# Patient Record
Sex: Female | Born: 1975 | Race: White | Hispanic: No | Smoking: Never smoker
Health system: Southern US, Community
[De-identification: ages and names within clinical notes are randomized; demographics above are authoritative.]

## PROBLEM LIST (undated history)

## (undated) ENCOUNTER — Inpatient Hospital Stay (HOSPITAL_COMMUNITY): Payer: Self-pay

## (undated) ENCOUNTER — Emergency Department (HOSPITAL_COMMUNITY): Admission: EM | Payer: Managed Care, Other (non HMO) | Source: Home / Self Care

## (undated) DIAGNOSIS — M25569 Pain in unspecified knee: Secondary | ICD-10-CM

## (undated) DIAGNOSIS — F419 Anxiety disorder, unspecified: Secondary | ICD-10-CM

## (undated) DIAGNOSIS — F32A Depression, unspecified: Secondary | ICD-10-CM

## (undated) DIAGNOSIS — M222X2 Patellofemoral disorders, left knee: Secondary | ICD-10-CM

## (undated) DIAGNOSIS — G43909 Migraine, unspecified, not intractable, without status migrainosus: Secondary | ICD-10-CM

## (undated) DIAGNOSIS — G8929 Other chronic pain: Secondary | ICD-10-CM

## (undated) DIAGNOSIS — E538 Deficiency of other specified B group vitamins: Secondary | ICD-10-CM

## (undated) DIAGNOSIS — K219 Gastro-esophageal reflux disease without esophagitis: Secondary | ICD-10-CM

## (undated) DIAGNOSIS — M1990 Unspecified osteoarthritis, unspecified site: Secondary | ICD-10-CM

## (undated) DIAGNOSIS — D509 Iron deficiency anemia, unspecified: Secondary | ICD-10-CM

## (undated) DIAGNOSIS — M25512 Pain in left shoulder: Secondary | ICD-10-CM

## (undated) DIAGNOSIS — T7840XA Allergy, unspecified, initial encounter: Secondary | ICD-10-CM

## (undated) DIAGNOSIS — M7500 Adhesive capsulitis of unspecified shoulder: Secondary | ICD-10-CM

## (undated) DIAGNOSIS — E669 Obesity, unspecified: Secondary | ICD-10-CM

## (undated) DIAGNOSIS — Z9884 Bariatric surgery status: Secondary | ICD-10-CM

## (undated) DIAGNOSIS — M542 Cervicalgia: Secondary | ICD-10-CM

## (undated) DIAGNOSIS — D649 Anemia, unspecified: Secondary | ICD-10-CM

## (undated) DIAGNOSIS — F329 Major depressive disorder, single episode, unspecified: Secondary | ICD-10-CM

## (undated) HISTORY — DX: Major depressive disorder, single episode, unspecified: F32.9

## (undated) HISTORY — DX: Depression, unspecified: F32.A

## (undated) HISTORY — DX: Allergy, unspecified, initial encounter: T78.40XA

## (undated) HISTORY — DX: Deficiency of other specified B group vitamins: E53.8

## (undated) HISTORY — DX: Bariatric surgery status: Z98.84

## (undated) HISTORY — DX: Iron deficiency anemia, unspecified: D50.9

## (undated) HISTORY — DX: Gastro-esophageal reflux disease without esophagitis: K21.9

## (undated) HISTORY — DX: Unspecified osteoarthritis, unspecified site: M19.90

## (undated) SURGERY — Surgical Case
Anesthesia: *Unknown

---

## 2006-10-06 ENCOUNTER — Emergency Department (HOSPITAL_COMMUNITY): Admission: EM | Admit: 2006-10-06 | Discharge: 2006-10-06 | Payer: Self-pay | Admitting: Emergency Medicine

## 2006-10-30 ENCOUNTER — Inpatient Hospital Stay (HOSPITAL_COMMUNITY): Admission: EM | Admit: 2006-10-30 | Discharge: 2006-10-31 | Payer: Self-pay | Admitting: Emergency Medicine

## 2008-01-01 HISTORY — PX: FERTILITY SURGERY: SHX945

## 2011-04-20 HISTORY — PX: GASTRIC BYPASS: SHX52

## 2011-10-04 ENCOUNTER — Observation Stay (HOSPITAL_COMMUNITY)
Admission: EM | Admit: 2011-10-04 | Discharge: 2011-10-05 | Disposition: A | Discharge status: Home / Self Care | Payer: Medicaid Other | Attending: Obstetrics and Gynecology | Admitting: Obstetrics and Gynecology

## 2011-10-04 ENCOUNTER — Encounter: Payer: Self-pay | Admitting: Emergency Medicine

## 2011-10-04 DIAGNOSIS — O2390 Unspecified genitourinary tract infection in pregnancy, unspecified trimester: Secondary | ICD-10-CM | POA: Insufficient documentation

## 2011-10-04 DIAGNOSIS — O99891 Other specified diseases and conditions complicating pregnancy: Secondary | ICD-10-CM | POA: Insufficient documentation

## 2011-10-04 DIAGNOSIS — N390 Urinary tract infection, site not specified: Secondary | ICD-10-CM | POA: Insufficient documentation

## 2011-10-04 DIAGNOSIS — O211 Hyperemesis gravidarum with metabolic disturbance: Principal | ICD-10-CM | POA: Insufficient documentation

## 2011-10-04 DIAGNOSIS — E860 Dehydration: Secondary | ICD-10-CM

## 2011-10-04 DIAGNOSIS — O99840 Bariatric surgery status complicating pregnancy, unspecified trimester: Secondary | ICD-10-CM | POA: Insufficient documentation

## 2011-10-04 DIAGNOSIS — E669 Obesity, unspecified: Secondary | ICD-10-CM

## 2011-10-04 DIAGNOSIS — Z6827 Body mass index (BMI) 27.0-27.9, adult: Secondary | ICD-10-CM | POA: Insufficient documentation

## 2011-10-04 DIAGNOSIS — E46 Unspecified protein-calorie malnutrition: Secondary | ICD-10-CM | POA: Insufficient documentation

## 2011-10-04 DIAGNOSIS — E876 Hypokalemia: Secondary | ICD-10-CM | POA: Insufficient documentation

## 2011-10-04 HISTORY — DX: Obesity, unspecified: E66.9

## 2011-10-04 LAB — DIFFERENTIAL
Basophils Absolute: 0 10*3/uL (ref 0.0–0.1)
Basophils Relative: 0 % (ref 0–1)
Eosinophils Relative: 1 % (ref 0–5)
Monocytes Absolute: 0.5 10*3/uL (ref 0.1–1.0)
Monocytes Relative: 7 % (ref 3–12)

## 2011-10-04 LAB — ANTIBODY SCREEN: Antibody Screen: NEGATIVE

## 2011-10-04 LAB — URINALYSIS, ROUTINE W REFLEX MICROSCOPIC
Glucose, UA: NEGATIVE mg/dL
Hgb urine dipstick: NEGATIVE
Ketones, ur: >80 mg/dL — AB
Protein, ur: 30 mg/dL — AB
pH: 6 (ref 5.0–8.0)

## 2011-10-04 LAB — CBC
HCT: 35.8 % — ABNORMAL LOW (ref 36.0–46.0)
Hemoglobin: 12.6 g/dL (ref 12.0–15.0)
MCH: 29.6 pg (ref 26.0–34.0)
MCHC: 35.2 g/dL (ref 30.0–36.0)
MCV: 84 fL (ref 78.0–100.0)
RDW: 13 % (ref 11.5–15.5)

## 2011-10-04 LAB — HCG, QUANTITATIVE, PREGNANCY: hCG, Beta Chain, Quant, S: 56547 m[IU]/mL — ABNORMAL HIGH (ref <5)

## 2011-10-04 LAB — BASIC METABOLIC PANEL
BUN: 5 mg/dL — ABNORMAL LOW (ref 6–23)
Calcium: 9.4 mg/dL (ref 8.4–10.5)
Chloride: 100 mEq/L (ref 96–112)
Creatinine, Ser: 0.52 mg/dL (ref 0.50–1.10)
GFR calc Af Amer: >90 mL/min (ref >90)

## 2011-10-04 LAB — ABO/RH

## 2011-10-04 LAB — WET PREP, GENITAL
Trich, Wet Prep: NONE SEEN
Yeast Wet Prep HPF POC: NONE SEEN

## 2011-10-04 LAB — URINE MICROSCOPIC-ADD ON

## 2011-10-04 LAB — RPR: RPR: NONREACTIVE

## 2011-10-04 MED ORDER — SODIUM CHLORIDE 0.9 % IV BOLUS (SEPSIS)
1000.0000 mL | Freq: Once | INTRAVENOUS | Status: AC
  Administered 2011-10-04: 1000 mL via INTRAVENOUS

## 2011-10-04 MED ORDER — DOCUSATE SODIUM 100 MG PO CAPS
100.0000 mg | ORAL_CAPSULE | Freq: Every day | ORAL | Status: DC
  Administered 2011-10-05: 100 mg via ORAL
  Filled 2011-10-04: qty 1

## 2011-10-04 MED ORDER — ACETAMINOPHEN 325 MG PO TABS: 650.0000 mg | ORAL_TABLET | ORAL | Status: DC | PRN

## 2011-10-04 MED ORDER — POTASSIUM CHLORIDE CRYS ER 20 MEQ PO TBCR
40.0000 meq | EXTENDED_RELEASE_TABLET | Freq: Once | ORAL | Status: AC
  Administered 2011-10-04: 40 meq via ORAL

## 2011-10-04 MED ORDER — PRENATAL PLUS 27-1 MG PO TABS
1.0000 | ORAL_TABLET | Freq: Every day | ORAL | Status: DC
Start: 2011-10-05 — End: 2011-10-05
  Administered 2011-10-05: 1 via ORAL
  Filled 2011-10-04 (×4): qty 1

## 2011-10-04 MED ORDER — CALCIUM CARBONATE ANTACID 500 MG PO CHEW: 2.0000 | CHEWABLE_TABLET | ORAL | Status: DC | PRN

## 2011-10-04 MED ORDER — ZOLPIDEM TARTRATE 5 MG PO TABS: 10.0000 mg | ORAL_TABLET | Freq: Every evening | ORAL | Status: DC | PRN

## 2011-10-04 MED ORDER — ONDANSETRON HCL 4 MG/2ML IJ SOLN
4.0000 mg | Freq: Once | INTRAMUSCULAR | Status: AC
  Administered 2011-10-04: 4 mg via INTRAVENOUS
  Filled 2011-10-04: qty 2

## 2011-10-04 MED ORDER — KCL IN DEXTROSE-NACL 40-5-0.45 MEQ/L-%-% IV SOLN
INTRAVENOUS | Status: DC
  Administered 2011-10-04 – 2011-10-05 (×2): via INTRAVENOUS

## 2011-10-04 MED ORDER — PROMETHAZINE HCL 25 MG/ML IJ SOLN
12.5000 mg | INTRAMUSCULAR | Status: DC | PRN
Start: 2011-10-04 — End: 2011-10-05
  Administered 2011-10-04: 12.5 mg via INTRAVENOUS
  Filled 2011-10-04: qty 1

## 2011-10-04 MED ORDER — DEXTROSE 5 % IV SOLN
1.0000 g | Freq: Once | INTRAVENOUS | Status: AC
  Administered 2011-10-05: 1 g via INTRAVENOUS
  Filled 2011-10-04: qty 1

## 2011-10-04 NOTE — ED Notes (Signed)
Family at bedside. 

## 2011-10-04 NOTE — ED Notes (Signed)
FHT of 168.

## 2011-10-04 NOTE — ED Notes (Signed)
Pt states she is [redacted] weeks pregnant. C/o burning with urination x 1 week. Feels dehydrated. Slightly pale to face.

## 2011-10-04 NOTE — ED Notes (Signed)
Pt stating she feels good enough to go home

## 2011-10-04 NOTE — H&P (Signed)
[name] is a 35 y.o. female presenting for hyperemesis, and dehydration, mild, with hypokalemia. 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 Tonight she had uti symptoms of suprapubic pressure, dysuria, and no frequency, but urgency.  Eval in [facility] ed shows uti findings as well as dehydration with SG > 1.030 and ketones and proteinuria. History OB History    Grav Para Term Preterm Abortions TAB SAB Ect Mult Living                 History reviewed. No pertinent past medical history. Past Surgical History  Procedure Date  . Gastric bypass   . Fertility surgery    Family History: family history is not on file. Social History:  reports that she has never smoked. She does not have any smokeless tobacco history on file. She reports that she does not drink alcohol or use illicit drugs.  ROS     Blood pressure 113/71, pulse 73, temperature 98.2 F (36.8 C), temperature source Oral, resp. rate 18, height 5\' 5"  (1.651 m), weight 74.844 kg (165 lb), last menstrual period 06/25/2011, SpO2 99.00%. Exam Physical Exam Physical Examination: General appearance - alert, well appearing, and in no distress, oriented to person, place, and time and dehydrated Mouth - mucous membranes moist, pharynx normal without lesions, dental hygiene good and tongue normal Chest - not examined Abdomen - soft, nontender, nondistended, no masses or organomegaly Fetal heart tones confirmed by doppler                      No guarding or rebound, no ruq or RLQ pain.  Pelvic - examination not indicated Extremities - peripheral pulses normal, no pedal edema, no clubbing or cyanosis, no pedal edema noted Skin - normal coloration and turgor, no rashes, no suspicious skin lesions noted Prenatal labs: ABO, Rh:   Antibody:   Rubella:   RPR:    HBsAg:    HIV:    GBS:     Assessment/Plan: Pregnancy 12 weeks, Hyperemesis  Gravidarum with dehydration and Hypokalemia,  Plan:  Overnight rehydration, and discharge for outpatient care Request records from [facility], [place], near [place] regarding baseline pregnancy labs.   Lakaya Tolen V 10/04/2011, 9:44 PM

## 2011-10-04 NOTE — ED Notes (Signed)
Patient is resting comfortably. 

## 2011-10-04 NOTE — ED Provider Notes (Signed)
History     CSN: [id number] Arrival date & time: 10/04/2011  4:04 PM  Chief Complaint  Patient presents with  . Dysuria  . Dehydration    (Consider location/radiation/quality/duration/timing/severity/associated sxs/prior treatment) HPI Comments: Patient who is approximately [redacted] weeks pregnant c/o burning with urination for one week.  Also c/o generalized fatique, and weakness with excessive vomiting.  Also reports that she had gastric bypass in April 2012.  Patient has recently moved here from [place] and has appt with Dr. Emelda Fear on October 17.  She denies fever, diarrhea or upper abdominal pain.    Patient is a 35 y.o. female presenting with dysuria. The history is provided by the patient.  Dysuria  This is a new problem. The current episode started more than 2 days ago. The problem occurs intermittently. The problem has been gradually worsening. The quality of the pain is described as burning and aching. The pain is moderate. There has been no fever. She is sexually active. Associated symptoms include nausea, vomiting, discharge and flank pain. Pertinent negatives include no chills, no sweats, no frequency, no hematuria and no hesitancy. Associated symptoms comments: [redacted] weeks pregnant. She has tried nothing for the symptoms. Her past medical history is significant for kidney stones. Her past medical history does not include urological procedure or catheterization.    History reviewed. No pertinent past medical history.  Past Surgical History  Procedure Date  . Gastric bypass   . Fertility surgery     History reviewed. No pertinent family history.  History  Substance Use Topics  . Smoking status: Never Smoker   . Smokeless tobacco: Not on file  . Alcohol Use: No    OB History    Grav Para Term Preterm Abortions TAB SAB Ect Mult Living                  Review of Systems  Constitutional: Positive for fatigue. Negative for fever, chills, activity change and appetite change.    HENT: Negative for sore throat, facial swelling, trouble swallowing, neck pain and neck stiffness.   Respiratory: Negative for cough, shortness of breath and wheezing.   Cardiovascular: Negative for chest pain and palpitations.  Gastrointestinal: Positive for nausea, vomiting and abdominal pain. Negative for diarrhea, blood in stool and abdominal distention.  Genitourinary: Positive for dysuria, flank pain and pelvic pain. Negative for hesitancy, frequency, hematuria, decreased urine volume and vaginal bleeding.  Musculoskeletal: Negative for myalgias, back pain and arthralgias.  Skin: Negative for rash.  Neurological: Positive for weakness. Negative for dizziness, speech difficulty and numbness.  Hematological: Does not bruise/bleed easily.  All other systems reviewed and are negative.    Allergies  Morphine and related and Penicillins  Home Medications     BP 105/70  Pulse 68  Temp(Src) 98.2 F (36.8 C) (Oral)  Resp 16  Ht 5\' 5"  (1.651 m)  Wt 165 lb (74.844 kg)  BMI 27.46 kg/m2  SpO2 100%  LMP 06/25/2011  Physical Exam  Nursing note and vitals reviewed. Constitutional: She is oriented to person, place, and time. She appears well-developed and well-nourished. No distress.  HENT:  Head: Normocephalic and atraumatic.       Mucous membranes are slightly dry.    Eyes: EOM are normal. Pupils are equal, round, and reactive to light.  Neck: Normal range of motion. Neck supple. No JVD present.  Cardiovascular: Normal rate, regular rhythm and normal heart sounds.   No murmur heard. Pulmonary/Chest: Effort normal and breath sounds normal. No respiratory  distress. She exhibits no tenderness.  Abdominal: Soft. She exhibits no distension and no mass. There is no splenomegaly or hepatomegaly. There is tenderness in the suprapubic area. There is no rigidity, no rebound, no guarding and no CVA tenderness.  Genitourinary: Uterus is enlarged. Cervix exhibits no motion tenderness and no  friability. Right adnexum displays no mass and no tenderness. Left adnexum displays no mass and no tenderness. No erythema, tenderness or bleeding around the vagina. No foreign body around the vagina. Vaginal discharge found.       Uterus is gravid.  Cervix is closed.  No vaginal bleeding.    Musculoskeletal: Normal range of motion. She exhibits no edema and no tenderness.  Lymphadenopathy:    She has no cervical adenopathy.  Neurological: She is alert and oriented to person, place, and time. She has normal strength. No cranial nerve deficit or sensory deficit. She exhibits normal muscle tone. Coordination normal.  Skin: Skin is warm and dry.    ED Course  Procedures (including critical care time)  Results for orders placed during the hospital encounter of 10/04/11  URINALYSIS, ROUTINE W REFLEX MICROSCOPIC      Component Value Range   Color, Urine AMBER (*) YELLOW    Appearance CLEAR  CLEAR    Specific Gravity, Urine >1.030 (*) 1.005 - 1.030    pH 6.0  5.0 - 8.0    Glucose, UA NEGATIVE  NEGATIVE (mg/dL)   Hgb urine dipstick NEGATIVE  NEGATIVE    Bilirubin Urine MODERATE (*) NEGATIVE    Ketones, ur >80 (*) NEGATIVE (mg/dL)   Protein, ur 30 (*) NEGATIVE (mg/dL)   Urobilinogen, UA 1.0  0.0 - 1.0 (mg/dL)   Nitrite NEGATIVE  NEGATIVE    Leukocytes, UA NEGATIVE  NEGATIVE   CBC      Component Value Range   WBC 7.0  4.0 - 10.5 (K/uL)   RBC 4.26  3.87 - 5.11 (MIL/uL)   Hemoglobin 12.6  12.0 - 15.0 (g/dL)   HCT 19.1 (*) 47.8 - 46.0 (%)   MCV 84.0  78.0 - 100.0 (fL)   MCH 29.6  26.0 - 34.0 (pg)   MCHC 35.2  30.0 - 36.0 (g/dL)   RDW 29.5  62.1 - 30.8 (%)   Platelets 197  150 - 400 (K/uL)  DIFFERENTIAL      Component Value Range   Neutrophils Relative 70  43 - 77 (%)   Neutro Abs 4.9  1.7 - 7.7 (K/uL)   Lymphocytes Relative 22  12 - 46 (%)   Lymphs Abs 1.5  0.7 - 4.0 (K/uL)   Monocytes Relative 7  3 - 12 (%)   Monocytes Absolute 0.5  0.1 - 1.0 (K/uL)   Eosinophils Relative 1  0 - 5  (%)   Eosinophils Absolute 0.1  0.0 - 0.7 (K/uL)   Basophils Relative 0  0 - 1 (%)   Basophils Absolute 0.0  0.0 - 0.1 (K/uL)  BASIC METABOLIC PANEL      Component Value Range   Sodium 137  135 - 145 (mEq/L)   Potassium 3.0 (*) 3.5 - 5.1 (mEq/L)   Chloride 100  96 - 112 (mEq/L)   CO2 18 (*) 19 - 32 (mEq/L)   Glucose, Bld 77  70 - 99 (mg/dL)   BUN 5 (*) 6 - 23 (mg/dL)   Creatinine, Ser 6.57  0.50 - 1.10 (mg/dL)   Calcium 9.4  8.4 - 84.6 (mg/dL)   GFR calc non Af Amer >90  >  90 (mL/min)   GFR calc Af Amer >90  >90 (mL/min)  HCG, QUANTITATIVE, PREGNANCY      Component Value Range   hCG, Beta Chain, Quant, S [address] (*) <5 (mIU/mL)  URINE MICROSCOPIC-ADD ON      Component Value Range   Squamous Epithelial / LPF MANY (*) RARE    WBC, UA 0-2  <3 (WBC/hpf)   Bacteria, UA MANY (*) RARE    Casts HYALINE CASTS (*) NEGATIVE   WET PREP, GENITAL      Component Value Range   Yeast, Wet Prep NONE SEEN  NONE SEEN    Trich, Wet Prep NONE SEEN  NONE SEEN    Clue Cells, Wet Prep RARE (*) NONE SEEN    WBC, Wet Prep HPF POC MODERATE (*) NONE SEEN        MDM     8:26 PM patient is feeling better,  Vitals stable. No active vomiting during ED stay.   She has proteinuria w/o extremity edema or HTN.  Urine culture is pending. I will consult Dr. Emelda Fear.  I have also discussed pt hx, labs and care plan with the EDP   2116  Dr. Emelda Fear to admit patient for further IVF's and monitoring.    Oneka Parada L. [place], [place] 10/04/11 2232

## 2011-10-05 LAB — CBC
HCT: 29.3 % — ABNORMAL LOW (ref 36.0–46.0)
MCH: 29.7 pg (ref 26.0–34.0)
MCV: 84.4 fL (ref 78.0–100.0)
RBC: 3.47 MIL/uL — ABNORMAL LOW (ref 3.87–5.11)
RDW: 13.1 % (ref 11.5–15.5)
WBC: 5.3 10*3/uL (ref 4.0–10.5)

## 2011-10-05 LAB — BASIC METABOLIC PANEL
CO2: 19 mEq/L (ref 19–32)
Chloride: 109 mEq/L (ref 96–112)
Creatinine, Ser: <0.47 mg/dL — ABNORMAL LOW (ref 0.50–1.10)
Glucose, Bld: 75 mg/dL (ref 70–99)

## 2011-10-05 MED ORDER — SODIUM CHLORIDE 0.9 % IV SOLN
Freq: Once | INTRAVENOUS | Status: AC
  Administered 2011-10-05: 10:00:00 via INTRAVENOUS
  Filled 2011-10-05: qty 1000

## 2011-10-05 MED ORDER — SODIUM CHLORIDE 0.9 % IJ SOLN
INTRAMUSCULAR | Status: AC
  Filled 2011-10-05: qty 3

## 2011-10-05 MED ORDER — ONDANSETRON 4 MG PO TBDP
8.0000 mg | ORAL_TABLET | Freq: Three times a day (TID) | ORAL | Status: DC | PRN
  Administered 2011-10-05: 8 mg via ORAL
  Filled 2011-10-05: qty 2

## 2011-10-05 MED ORDER — ONDANSETRON 8 MG PO TBDP: 8.0000 mg | ORAL_TABLET | Freq: Three times a day (TID) | ORAL | Status: AC | PRN

## 2011-10-05 MED ORDER — PROMETHAZINE HCL 50 MG RE SUPP: 25.0000 mg | Freq: Four times a day (QID) | RECTAL | Status: AC | PRN

## 2011-10-05 MED ORDER — DEXTROSE 5 % IV SOLN
INTRAVENOUS | Status: AC
  Filled 2011-10-05: qty 1

## 2011-10-05 MED ORDER — POTASSIUM CHLORIDE 10 MEQ PO CPCR: 10.0000 meq | ORAL_CAPSULE | Freq: Two times a day (BID) | ORAL | Status: DC

## 2011-10-05 MED ORDER — DEXTROSE-NACL 5-0.45 % IV SOLN: INTRAVENOUS | Status: DC

## 2011-10-05 MED ORDER — PRENATAL PLUS 27-1 MG PO TABS: 1.0000 | ORAL_TABLET | Freq: Every day | ORAL | Status: DC

## 2011-10-05 MED ORDER — PRO-STAT SUGAR FREE PO LIQD: 30.0000 mL | Freq: Three times a day (TID) | ORAL | Status: DC

## 2011-10-05 NOTE — Progress Notes (Signed)
Fetal Heart tones assessed at 175 bpm.

## 2011-10-05 NOTE — Progress Notes (Signed)
Patient received discharge instructions along with follow up appointments and prescriptions. Patient verbalized understanding of all instructions. Patient was escorted by staff via wheelchair to vehicle. Patient discharged to home in stable condition. 

## 2011-10-05 NOTE — Progress Notes (Signed)
UR Chart Review Completed  

## 2011-10-05 NOTE — Discharge Summary (Signed)
  See dictated discharge summary this date. [email or web address]

## 2011-10-05 NOTE — Progress Notes (Signed)
INITIAL ADULT NUTRITION ASSESSMENT Date: 10/05/2011   Time: 11:19 AM Reason for Assessment: Hyperemesis-gravidum, 35# wt loss in 12 weeks of pregnancy, s/p gastric bypass surgery.  ASSESSMENT: Female 35 y.o.  Dx: <principal problem not specified>   Past Medical History  Diagnosis Date  . Obesity     gastric Bypass, Roux-en Y    Scheduled Meds:   . cefTRIAXone (ROCEPHIN) IV  1 g Intravenous Once  . docusate sodium  100 mg Oral Daily  . ondansetron  4 mg Intravenous Once  . potassium chloride SA  40 mEq Oral Once  . prenatal vitamin w/FE, FA  1 tablet Oral Daily  . sodium chloride 0.9 % 1,000 mL with multivitamins adult (MVI -12) 10 mL infusion   Intravenous Once  . sodium chloride  1,000 mL Intravenous Once  . sodium chloride  1,000 mL Intravenous Once   Continuous Infusions:   . dextrose 5 % and 0.45% NaCl    . DISCONTD: dextrose 5 % and 0.45 % NaCl with KCl 40 mEq/L 200 mL/hr at 10/05/11 0554   PRN Meds:.acetaminophen, calcium carbonate, ondansetron, promethazine, zolpidem  Ht: 5\' 5"  (165.1 cm)  Wt: 169 lb 5 oz (76.8 kg)  Ideal Wt: 57 kg (125#)  61.5 kg % Ideal Wt: 135%  Usual Wt: pre-op wt 271# % Usual Wt: 62%  Body mass index is 28.18 kg/(m^2).  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 Planned wt loss of 67# prior to July. She became pregnant in late July and has cont to lose severe amount of wt- unplanned (35#,18%) since conceiving. Currnetly being tx for dehydration, Hyperemesis-gravidum. She is tol her breakfast today and reports to be ready for lunch. We discussed adequacy of nutrition to support current pregnancy and reviewed  Advanced Gastric Bypass diet. She has severe malnutrition in the context of chronic illness given her wt loss, altered GI function with inadequate oral intake.   BMET    Component Value Date/Time   NA 138 10/05/2011 0447   K 3.7 10/05/2011 0447   CL 109 10/05/2011 0447   CO2 19 10/05/2011 0447   GLUCOSE 75 10/05/2011 0447   BUN 3* 10/05/2011 0447   CREATININE <0.47* 10/05/2011 0447   CALCIUM 8.1* 10/05/2011 0447   GFRNONAA NOT CALCULATED 10/05/2011 0447   GFRAA NOT CALCULATED 10/05/2011 0447   CBC    Component Value Date/Time   WBC 5.3 10/05/2011 0447   RBC 3.47* 10/05/2011 0447   HGB 10.4* 10/05/2011 0447   HCT 29.3* 10/05/2011 0447   PLT 163 10/05/2011 0447   MCV 84.4 10/05/2011 0447   MCH 29.7 10/05/2011 0447   MCHC 35.2 10/05/2011 0447   RDW 13.1 10/05/2011 0447   LYMPHSABS 1.5 10/04/2011 1724   MONOABS 0.5 10/04/2011 1724   EOSABS 0.1 10/04/2011 1724   BASOSABS 0.0 10/04/2011 1724   CBG (last 3)  No results found for this basename: GLUCAP:3 in the last 72 hours    Intake/Output Summary (Last 24 hours) at 10/05/11 1128 Last data filed at 10/05/11 0523  Gross per 24 hour  Intake 1226.67 ml  Output      0 ml  Net 1226.67 ml    Diet Order:  Advanced Bariatric-  Supplements/Tube Feeding:none at this time  IVF:    dextrose 5 % and 0.45% NaCl   DISCONTD: dextrose 5 % and 0.45 % NaCl with KCl 40 mEq/L Last Rate: 200 mL/hr at 10/05/11 0554    Estimated Nutritional Needs:  Kcal:1440-1575 Protein:70-80 grams Fluid:1.4-1.6 L/d  NUTRITION DIAGNOSIS: -Inadequate oral intake (NI-2.1).  Status: Ongoing  RELATED TO:  -altered GI function  AS EVIDENCE BY:  -N/V, dehydration, s/p Gastric bypass ~2mo ago  MONITORING/EVALUATION(Goals -Alleviate symptoms and prevent additional unplanned wt loss. -Stable electrolyte and fluid balance -Pt will tol Regular diet with appropriate modifications r/t gastric bypass -Adequate rate of weight gain                2 - 5 lb. total fist trimester     0.5 - 1 lb each week second and third trimester -Monitor po's, labs, wt changes  EDUCATION NEEDS: -Education needs addressed r/t gastric bypass and pregnancy  INTERVENTION: -Provide edu and obtain food preferences to optimize nutr status -Add Sugar-free ProStat 30 ml TID (216 kcal, 45  gr protein) per day -If conservative therapy fails rec consider TF to meet nutr needs. -If TF becomes and option rec continuous Osm 1.5 via NG. Initiate at 20 ml/hr adv to 40 ml/hr (goal rate provides: 1440 kcal, 60 gr protein, 731 ml water). -Rec [facility] after d/c due to compromised nutr status  Dietitian [phone]  DOCUMENTATION CODES Per approved criteria  -Severe malnutrition in the context of chronic illness    [name] 10/05/2011, 11:19 AM

## 2011-10-05 NOTE — Progress Notes (Signed)
Subjective: Patient reports nausea.  No vomiting  She has voided only once this morning, but is hungry, desires regular diet   Objective: I have reviewed patient's vital signs and labs.  GI: soft, non-tender; bowel sounds normal; no masses,  no organomegaly   Assessment/Plan: Pregnancy, 12 weeks, hyperemesis, s/p gastric bypass..  Add multivits, add dietary consult LOS: 1 day    Chaye Misch V 10/05/2011, 9:43 AM

## 2011-10-05 NOTE — ED Provider Notes (Signed)
Medical screening examination/treatment/procedure(s) were conducted as a shared visit with non-physician practitioner(s) and myself.  I personally evaluated the patient during the encounter  [redacted] weeks pregnant, confirmed IUP in [place].  Minimal suprapubic tenderness. Feeling generally weak, AFVSS  Glynn Octave, MD 10/05/11 1153

## 2011-10-06 LAB — GC/CHLAMYDIA PROBE AMP, GENITAL: Chlamydia, DNA Probe: NEGATIVE

## 2011-10-06 NOTE — Discharge Summary (Signed)
NAME:  [name], BENJAMIN NO.:  [id number]  MEDICAL RECORD NO.:  [id number]  LOCATION:  A301                          FACILITY:  [facility]  PHYSICIAN:  [name], M.D. DATE OF BIRTH:  Aug 31, 1976  DATE OF ADMISSION:  10/04/2011 DATE OF DISCHARGE:  10/05/2012LH                              DISCHARGE SUMMARY   ADMITTING DIAGNOSES:  Intrauterine pregnancy 12 weeks' gestation, mild dehydration, malnutrition status post gastric bypass, and extreme weight loss, electrolyte and fluid balance disorder.  Urinary tract infection.  DISCHARGE DIAGNOSES:  Intrauterine pregnancy 12 weeks, dehydration resolved, hypokalemia, corrected.  PROCEDURE:  IV fluid hydration duration x18 hours, nutritional consult.  DISCHARGE MEDICATIONS: 1. Zofran ODT 8 mg sublingual q.8 h p.r.n. nausea. 2. Phenergan 25 mg rectal suppositories q.6 h p.r.n. severe nausea. 3. Colace 100 mg p.o. b.i.d. 4. Potassium capsules 20 mEq p.o. daily. 5. Nutritional supplements to include referral to [facility] after discharge to support nutritional     status. Sugar Free Pro-Stat 30 mL t.i.d. per day.  Consideration of     tube feedings with continuous OSM 1.5 via NG tube if necessary for     goal of 1400-1500 calories per day. 6. Macrobid 100 mg p.o. b.i.d.  HOSPITAL SUMMARY:  This 35 year old female gravida 3, para 2-0-0-2, is admitted for hyperemesis and dehydration with mild hypokalemia.  She is new to this area, is status post gastric bypass earlier this year and a presurgical weight of 271 pounds with a 100-pound weight loss since surgery, 35-pound weight loss since conception.  She presented to the ER due to urinary tract infection symptoms.  The patient had a gastric bypass, Roux-en-Y, procedure performed early 2012 in [place], [place].  PAST MEDICAL HISTORY:  Positive for morbid obesity only.  SURGICAL HISTORY:  Gastric bypass surgery, fertility  surgery.  Physical exam reveals a generally slim Caucasian female whose admitting status was notable for mild dehydration and ketosis with specific gravity 1.030, greater than 80 mg per deciliter of ketones, 1+ protein, negative nitrates and leukocytes with many hyaline casts.  Wet prep was negative.  BUN is 5, creatinine 0.52 indicating a chronic dehydration, compensated.  Sodium 137, potassium 3.0, and creatinine 0.52. Quantitative beta hCG of 56,547.  HOSPITAL COURSE:  The patient was admitted, had admitting temperature 98.2, blood pressure 113/71, pulse 73, height 5 feet 5 inches, weight 165 pounds which is 74.844 kg.  She showed adequate hydration, was oriented, mucous membranes were moist.  Fetal heart tones were documented in the abdomen which was otherwise nondistended and nontender.  Pelvic exam deferred.  The patient was admitted, received vigorous IV fluid hydration with 40 mEq KCl per liter, and overnight had gradual improvement in hydration and urinary output.  She had multivitamins administered.  Urine C and S was ordered and is pending.  Nutritional consult was performed by Francene Boyers, registered dietitian, making note of the patient's inadequate nutritional status.  Estimated nutritional needs were 1440- 1575 calories per day with 70-80 g protein and 1.4 to 1.6 liters of fluid per day desired.  Nutritional dietitian phone contact number is [phone].  See EPIC for details of nutritional consult.  The  patient tolerated "half" of her regular breakfast and lunch with no vomiting, was discharged home.  It appears that she is not actually throwing up and much she is avoiding p.o. intake due to loss of appetite.  She has continued to, as she was trying to lose weight.  The patient is aware that nutritional supplementation will be necessary and follow up will be made with Nutrition and [facility] Management at [facility].  DISCHARGE FOLLOWUP:  [facility] in 1  week where we will initiate prenatal evaluation and care and achieve taking old records from [facility], [place], [place].     [name], M.D.     JVF/MEDQ  D:  10/05/2011  T:  10/06/2011  Job:  [id number]  cc:   [facility]

## 2011-10-28 ENCOUNTER — Encounter (HOSPITAL_COMMUNITY): Payer: Self-pay

## 2011-10-28 ENCOUNTER — Emergency Department (HOSPITAL_COMMUNITY)
Admission: EM | Admit: 2011-10-28 | Discharge: 2011-10-28 | Disposition: A | Discharge status: Home / Self Care | Payer: Medicaid Other | Attending: Emergency Medicine | Admitting: Emergency Medicine

## 2011-10-28 DIAGNOSIS — N760 Acute vaginitis: Secondary | ICD-10-CM | POA: Insufficient documentation

## 2011-10-28 DIAGNOSIS — J029 Acute pharyngitis, unspecified: Secondary | ICD-10-CM | POA: Insufficient documentation

## 2011-10-28 DIAGNOSIS — O2390 Unspecified genitourinary tract infection in pregnancy, unspecified trimester: Secondary | ICD-10-CM | POA: Insufficient documentation

## 2011-10-28 LAB — URINALYSIS, ROUTINE W REFLEX MICROSCOPIC
Leukocytes, UA: NEGATIVE
Protein, ur: NEGATIVE mg/dL
Urobilinogen, UA: >8 mg/dL — ABNORMAL HIGH (ref 0.0–1.0)

## 2011-10-28 LAB — WET PREP, GENITAL
Clue Cells Wet Prep HPF POC: NONE SEEN
Trich, Wet Prep: NONE SEEN

## 2011-10-28 MED ORDER — LIDOCAINE VISCOUS 2 % MT SOLN
20.0000 mL | Freq: Once | OROMUCOSAL | Status: AC
  Administered 2011-10-28: 20 mL via OROMUCOSAL
  Filled 2011-10-28 (×2): qty 15

## 2011-10-28 MED ORDER — LIDOCAINE VISCOUS 2 % MT SOLN: 15.0000 mL | Freq: Four times a day (QID) | OROMUCOSAL | Status: AC

## 2011-10-28 NOTE — ED Notes (Signed)
Pt presents with sore throat, fever, muscle aches, and vaginal discharge. Pt states the cold like symptoms started yesterday. The vaginal discharge started 1 week ago. Pt is 15.[redacted] weeks pregnant. NAD at this time.

## 2011-10-29 NOTE — ED Provider Notes (Signed)
History     CSN: [id number] Arrival date & time: 10/28/2011  8:10 PM   First MD Initiated Contact with Patient 10/28/11 2040      Chief Complaint  Patient presents with  . Sore Throat  . Vaginal Discharge    (Consider location/radiation/quality/duration/timing/severity/associated sxs/prior treatment) Patient is a 35 y.o. female presenting with pharyngitis and vaginal discharge. The history is provided by the patient.  Sore Throat This is a new problem. The current episode started yesterday. The problem occurs constantly. The problem has been unchanged. Associated symptoms include a fever, myalgias and a sore throat. Pertinent negatives include no abdominal pain, arthralgias, chest pain, chills, congestion, coughing, headaches, joint swelling, nausea, neck pain, numbness, rash or weakness. Associated symptoms comments: She also complains of itching, irritation and burning around her vulva and clitoris.  She is [redacted] weeks pregnant followed by Dr. Emelda Fear.  She does have a history of bacterial vaginosis which presented with similar symptoms,  rather than vaginal discharge (describes clear discharge tonight).  Denies abdominal and pelvic pain with no vaginal bleeding.. She has tried acetaminophen for the symptoms. The treatment provided no relief.  Vaginal Discharge Associated symptoms include a fever, myalgias and a sore throat. Pertinent negatives include no abdominal pain, arthralgias, chest pain, chills, congestion, coughing, headaches, joint swelling, nausea, neck pain, numbness, rash or weakness.    Past Medical History  Diagnosis Date  . Obesity     gastric Bypass, Roux-en Y    Past Surgical History  Procedure Date  . Gastric bypass 04/20/2011    [place], [place] 271lb preop  . Fertility surgery 2009    No abnormalities in female    Family History  Problem Relation Age of Onset  . Diabetes Father   . Hyperlipidemia Father   . Hypertension Father   . Cancer Father     skin  cancer  . Depression Mother     lifelong    History  Substance Use Topics  . Smoking status: Never Smoker   . Smokeless tobacco: Never Used  . Alcohol Use: No    OB History    Grav Para Term Preterm Abortions TAB SAB Ect Mult Living   3 2 2  0 0 0 0 0 0 2      Review of Systems  Constitutional: Positive for fever. Negative for chills.  HENT: Positive for sore throat. Negative for congestion, rhinorrhea, trouble swallowing, neck pain and sinus pressure.   Eyes: Negative.   Respiratory: Negative for cough, chest tightness and shortness of breath.   Cardiovascular: Negative for chest pain.  Gastrointestinal: Negative for nausea and abdominal pain.  Genitourinary: Positive for dysuria. Negative for frequency, vaginal bleeding, vaginal discharge and vaginal pain.  Musculoskeletal: Positive for myalgias. Negative for joint swelling and arthralgias.  Skin: Negative.  Negative for rash and wound.  Neurological: Negative for dizziness, weakness, light-headedness, numbness and headaches.  Hematological: Negative.   Psychiatric/Behavioral: Negative.     Allergies  Morphine and related and Penicillins  Home Medications      BP 113/72  Pulse 77  Temp(Src) 98.3 F (36.8 C) (Oral)  Resp 20  Ht 5\' 5"  (1.651 m)  Wt 166 lb (75.297 kg)  BMI 27.62 kg/m2  SpO2 100%  LMP 06/25/2011  Physical Exam  Nursing note and vitals reviewed. Constitutional: She is oriented to person, place, and time. She appears well-developed and well-nourished.  HENT:  Head: Normocephalic and atraumatic.  Eyes: Conjunctivae are normal.  Neck: Normal range of motion.  Cardiovascular: Normal rate, regular rhythm, normal heart sounds and intact distal pulses.   Pulmonary/Chest: Effort normal and breath sounds normal. She has no wheezes.  Abdominal: Soft. Bowel sounds are normal. There is no tenderness.  Genitourinary: Uterus normal. There is no tenderness or lesion on the right labia. There is no  tenderness or lesion on the left labia. Cervix exhibits discharge. Cervix exhibits no motion tenderness and no friability. Right adnexum displays no mass and no tenderness. Left adnexum displays no mass and no tenderness. Vaginal discharge found.       Labia and mons pubis appear dry,  Erythematous and irritated,  No rash noted.   Clear vaginal discharge.  Os closed.  Musculoskeletal: Normal range of motion.  Neurological: She is alert and oriented to person, place, and time.  Skin: Skin is warm and dry.  Psychiatric: She has a normal mood and affect.    ED Course  Procedures (including critical care time)  Labs Reviewed  WET PREP, GENITAL - Abnormal; Notable for the following:    WBC, Wet Prep HPF POC FEW (*)    All other components within normal limits  URINALYSIS, ROUTINE W REFLEX MICROSCOPIC - Abnormal; Notable for the following:    Bilirubin Urine SMALL (*)    Ketones, ur >80 (*)    Urobilinogen, UA >8.0 (*)    All other components within normal limits  RAPID STREP SCREEN   No results found.   1. Pharyngitis   2. Vaginitis in pregnancy       MDM  Patients labs and/or radiological studies were reviewed during the medical decision making and disposition process.   Strep negative.  Given lidocaine (viscous) gargle and spit with complete relief of sore throat.  Suggested desitin or A & D ointment for external perineal use for irritation.  Patient to followup with Dr Emelda Fear this week.        Candis Musa, PA 10/29/11 0130

## 2011-11-04 NOTE — ED Provider Notes (Signed)
Medical screening examination/treatment/procedure(s) were performed by non-physician practitioner and as supervising physician I was immediately available for consultation/collaboration.   Gwyneth Sprout, MD 11/04/11 1558

## 2011-11-13 ENCOUNTER — Encounter (HOSPITAL_COMMUNITY): Payer: Self-pay

## 2011-11-13 ENCOUNTER — Emergency Department (HOSPITAL_COMMUNITY)
Admission: EM | Admit: 2011-11-13 | Discharge: 2011-11-13 | Disposition: A | Discharge status: Home / Self Care | Payer: Medicaid Other | Attending: Emergency Medicine | Admitting: Emergency Medicine

## 2011-11-13 DIAGNOSIS — O200 Threatened abortion: Secondary | ICD-10-CM | POA: Insufficient documentation

## 2011-11-13 LAB — WET PREP, GENITAL
Clue Cells Wet Prep HPF POC: NONE SEEN
Yeast Wet Prep HPF POC: NONE SEEN

## 2011-11-13 NOTE — ED Notes (Signed)
[facility] notified of pt being on monitor, advised that they would monitor the strip

## 2011-11-13 NOTE — ED Notes (Signed)
Given message that pt. Is to be discharged to home undelivered.

## 2011-11-13 NOTE — ED Notes (Signed)
Dr. Emelda Fear at bedside performing ultrasound,

## 2011-11-13 NOTE — ED Notes (Signed)
C/o "gush of fluid" that occurred 2 hours ago with continuing to leak "fluid" pt waited at home to see what would happen per pt. Pt states contractions started. Pt unsure of how far apart contractions are. Pt reports being 18 weeks. Denies any pain at this time. Pt DID NOT call OB about pain or fluid

## 2011-11-13 NOTE — ED Notes (Signed)
Spoke with Raney at [employer] and updated on pt's condition, Dr. Emelda Fear is coming to see pt in er,

## 2011-11-13 NOTE — ED Provider Notes (Signed)
History    Scribed for Darlene Lennert, MD, the patient was seen in room APA01/APA01. This chart was scribed by Katha Cabal.   CSN: [id number] Arrival date & time: 11/13/2011  9:02 PM   First MD Initiated Contact with Patient 11/13/11 2128      Chief Complaint  Patient presents with  . Contractions  . Rupture of Membranes    (Consider location/radiation/quality/duration/timing/severity/associated sxs/prior treatment) Patient is a 35 y.o. female presenting with female genitourinary complaint. The history is provided by the patient. No language interpreter was used.  Female GU Problem Primary symptoms include discharge.  Primary symptoms include no genital odor and no vaginal bleeding. There has been no fever. This is a new problem. The current episode started 1 to 2 hours ago. The problem has not changed since onset.She is pregnant. Her LMP was months ago. The discharge was clear. Associated symptoms include abdominal pain. Pertinent negatives include no diarrhea and no frequency. She has tried nothing for the symptoms.  Patient is around [redacted] weeks pregnant.  Patient complains of mild to moderate contractions described as cramping. Symptoms are associated with leaking of clear fluid.  Patient reports "gush of fluid" about 2 hours ago.  Patient reports multiple episodes of leaking fluid yesterday.  Patient has not contacted OB-GYN regarding fluid or pain.  Symptoms are not associated with vaginal bleeding or odorous fluid.  Patient states she did not have Deberah Pelton during her other pregnancies. Patient denies current pain.    PCP [name], MD, MD     Past Medical History  Diagnosis Date  . Obesity     gastric Bypass, Roux-en Y    Past Surgical History  Procedure Date  . Gastric bypass 04/20/2011    [place], wt 271lb preop  . Fertility surgery 2009    No abnormalities in female    Family History  Problem Relation Age of Onset  . Diabetes Father   .  Hyperlipidemia Father   . Hypertension Father   . Cancer Father     skin cancer  . Depression Mother     lifelong    History  Substance Use Topics  . Smoking status: Never Smoker   . Smokeless tobacco: Never Used  . Alcohol Use: No    OB History    Grav Para Term Preterm Abortions TAB SAB Ect Mult Living   3 2 2  0 0 0 0 0 0 2      Review of Systems  Constitutional: Negative for fatigue.  HENT: Negative for congestion, sinus pressure and ear discharge.   Eyes: Negative for discharge.  Respiratory: Negative for cough.   Cardiovascular: Negative for chest pain.  Gastrointestinal: Positive for abdominal pain. Negative for diarrhea.  Genitourinary: Positive for vaginal discharge. Negative for frequency, hematuria and vaginal bleeding.  Musculoskeletal: Negative for back pain.  Skin: Negative for rash.  Neurological: Negative for seizures and headaches.  Hematological: Negative.   Psychiatric/Behavioral: Negative for hallucinations.    Allergies  Morphine and related and Penicillins  Home Medications   Current Outpatient Rx  Name Route Sig Dispense Refill  . VITAMIN D 1000 UNITS PO TABS Oral Take 1,000 Units by mouth daily.      Marland Kitchen POTASSIUM CHLORIDE 10 MEQ PO CPCR Oral Take 1 capsule (10 mEq total) by mouth 2 (two) times daily. 60 capsule 3  . PRENATAL PLUS 27-1 MG PO TABS Oral Take 1 tablet by mouth daily. 30 each 4  . THIAMINE HCL 100 MG  PO TABS Oral Take 100 mg by mouth every 7 (seven) days. On Mondays of each week     . VITAMIN B-12 IJ Injection Inject 1 each as directed every 30 (thirty) days. Vitamin B-12 1072mcg/ml Injection       BP 100/59  Pulse 85  Temp 98.2 F (36.8 C)  Resp 18  Ht 5\' 5"  (1.651 m)  Wt 157 lb (71.215 kg)  BMI 26.13 kg/m2  SpO2 98%  LMP 06/25/2011  Physical Exam  Constitutional: She is oriented to person, place, and time. She appears well-developed and well-nourished.  HENT:  Head: Normocephalic and atraumatic.  Eyes: Conjunctivae  and EOM are normal. No scleral icterus.  Neck: Neck supple. No thyromegaly present.  Cardiovascular: Normal rate and regular rhythm.  Exam reveals no gallop and no friction rub.   No murmur heard. Pulmonary/Chest: Effort normal. No stridor. No respiratory distress. She has no wheezes. She has no rales. She exhibits no tenderness.  Abdominal: She exhibits no distension. There is tenderness (mildy ) in the periumbilical area. There is no rebound.       Can feel fetus in upper umbilicus   Genitourinary: Vagina normal.       Fetal heart tones in 150s, closed os, clear fluid in vault   Musculoskeletal: Normal range of motion. She exhibits no edema.  Lymphadenopathy:    She has no cervical adenopathy.  Neurological: She is oriented to person, place, and time. Coordination normal.  Skin: No rash noted. No erythema.  Psychiatric: She has a normal mood and affect. Her behavior is normal.    ED Course  Procedures (including critical care time)   DIAGNOSTIC STUDIES: Oxygen Saturation is 100% on room air, normal by my interpretation.    COORDINATION OF CARE:  10:06 PM  Initital exam complete.  Will perform pelvic exam.  Consult with Dr. Emelda Fear.  10:16 PM  Pelvic exam complete.  10:48 PM  Dr. Emelda Fear examined patient and agrees with exam.   11:16 PM  Plan to discharge patient home. Patient will follow up with Dr. Emelda Fear at scheduled appointment.       LABS / RADIOLOGY:   Labs Reviewed  WET PREP, GENITAL - Abnormal; Notable for the following:    WBC, Wet Prep HPF POC FEW (*)    All other components within normal limits   Results for orders placed during the hospital encounter of 11/13/11  WET PREP, GENITAL      Component Value Range   Yeast, Wet Prep NONE SEEN  NONE SEEN    Trich, Wet Prep NONE SEEN  NONE SEEN    Clue Cells, Wet Prep NONE SEEN  NONE SEEN    WBC, Wet Prep HPF POC FEW (*) NONE SEEN   '  No results found.    Pt seen by Dr. Emelda Fear and an ultrasound was  done.  Pt had plenty of fluid in the uterus.  Membranes have not ruptured   MDM        MEDICATIONS GIVEN IN THE E.D. Scheduled Meds:   Continuous Infusions:       IMPRESSION: 1. Threatened abortion      DISCHARGE MEDICATIONS: New Prescriptions   No medications on file      The chart was scribed for me under my direct supervision.  I personally performed the history, physical, and medical decision making and all procedures in the evaluation of this patient.Tandy Gaw  Estell Harpin, MD 11/13/11 2321

## 2011-11-13 NOTE — ED Notes (Signed)
Pt states that she woke up drenched in fluid this afternoon, cramping pain started afterwards, pt states that she continues to have leakage clear in color, no odor noted, unsure of how often the pain is, this is pt's third pregnancy, due date April 20th, approx 17-[redacted] weeks along, pt states that pregnancy has been normal so far, normal prenatal care per pregnancy

## 2012-01-01 NOTE — L&D Delivery Note (Signed)
I was present for the exam and agree with above.  Dorathy Kinsman 04/04/2012 6:57 PM

## 2012-01-01 NOTE — L&D Delivery Note (Signed)
Delivery Note At 11:32 AM a viable female was delivered via Vaginal, Spontaneous Delivery (Presentation: ; Occiput Anterior).  APGAR: 9, 9; weight 6 lb 11 oz (3033 g).   Placenta status: Intact, Spontaneous.  Cord: 3 vessels with the following complications: None.  Cord pH: NA  Anesthesia: Epidural  Episiotomy: None Lacerations: None Suture Repair: none Est. Blood Loss (mL):   Mom to postpartum.  Baby to nursery-stable.  Buford Gayler 04/04/2012, 11:55 AM

## 2012-03-08 ENCOUNTER — Encounter (HOSPITAL_COMMUNITY): Payer: Self-pay | Admitting: *Deleted

## 2012-03-08 ENCOUNTER — Inpatient Hospital Stay (HOSPITAL_COMMUNITY)
Admission: AD | Admit: 2012-03-08 | Discharge: 2012-03-08 | Disposition: A | Discharge status: Home / Self Care | Payer: Medicaid Other | Source: Ambulatory Visit | Attending: Obstetrics & Gynecology | Admitting: Obstetrics & Gynecology

## 2012-03-08 DIAGNOSIS — R109 Unspecified abdominal pain: Secondary | ICD-10-CM | POA: Insufficient documentation

## 2012-03-08 DIAGNOSIS — O4700 False labor before 37 completed weeks of gestation, unspecified trimester: Secondary | ICD-10-CM | POA: Insufficient documentation

## 2012-03-08 DIAGNOSIS — O479 False labor, unspecified: Secondary | ICD-10-CM

## 2012-03-08 LAB — URINALYSIS, ROUTINE W REFLEX MICROSCOPIC
Bilirubin Urine: NEGATIVE
Ketones, ur: 40 mg/dL — AB
Nitrite: NEGATIVE
Specific Gravity, Urine: 1.01 (ref 1.005–1.030)
Urobilinogen, UA: 4 mg/dL — ABNORMAL HIGH (ref 0.0–1.0)

## 2012-03-08 LAB — URINE MICROSCOPIC-ADD ON

## 2012-03-08 MED ORDER — LACTATED RINGERS IV BOLUS (SEPSIS)
1000.0000 mL | Freq: Once | INTRAVENOUS | Status: AC
  Administered 2012-03-08: 1000 mL via INTRAVENOUS

## 2012-03-08 MED ORDER — LACTATED RINGERS IV SOLN
10.0000 mL | Freq: Once | INTRAVENOUS | Status: AC
  Administered 2012-03-08: 10 mL via INTRAVENOUS
  Filled 2012-03-08: qty 10

## 2012-03-08 NOTE — ED Provider Notes (Signed)
[name] is a 36 y.o. female presenting for eval of tightening in abd. Denies leak or bldg. Cx noted to be 6mm thick approx 1.5 wks ago and was given BMZ course and started on Prometrium. States she is typically dehydrated due to inability to tolerate large amounts of fluids, and requesting IVFs.Maternal Medical History:  Reason for admission: Reason for Admission:   nausea  OB History    Grav Para Term Preterm Abortions TAB SAB Ect Mult Living   3 2 2  0 0 0 0 0 0 2     Past Medical History  Diagnosis Date  . Obesity     gastric Bypass, Roux-en Y   Past Surgical History  Procedure Date  . Gastric bypass 04/20/2011    [place], wt 271lb preop  . Fertility surgery 2009    No abnormalities in female   Family History: family history includes Cancer in her father; Depression in her mother; Diabetes in her father; Hyperlipidemia in her father; and Hypertension in her father. Social History:  reports that she has never smoked. She has never used smokeless tobacco. She reports that she does not drink alcohol or use illicit drugs.  Review of Systems  Constitutional: Negative for fever.  Gastrointestinal: Negative for nausea and vomiting.  Neurological: Negative for dizziness.    Dilation: Closed Effacement (%): 70 Exam by:: K.Shaw,CNM Blood pressure 101/68, pulse 96, temperature 97 F (36.1 C), temperature source Oral, resp. rate 16, last menstrual period 06/25/2011. Maternal Exam:  Uterine Assessment: After 1st bag of fluid, ctx now q 5-10 min with interspersed irritability lasting 20 sec; after 2nd bag now with mostly irritability- mild  Cervix: closed/70%- feels like a small 'nub'  Fetal Exam Fetal Monitor Review: Baseline rate: 135.  Variability: moderate (6-25 bpm).   Pattern: accelerations present and no decelerations.       Physical Exam  Constitutional: She is oriented to person, place, and time. She appears well-developed and well-nourished.  HENT:    Head: Normocephalic.  Cardiovascular: Normal rate.   Respiratory: Effort normal.  Neurological: She is alert and oriented to person, place, and time.  Skin: Skin is warm and dry.  Psychiatric: She has a normal mood and affect. Her behavior is normal.    Urinalysis    Component Value Date/Time   COLORURINE ORANGE* 03/08/2012 2010   APPEARANCEUR CLEAR 03/08/2012 2010   LABSPEC 1.010 03/08/2012 2010   PHURINE 6.5 03/08/2012 2010   GLUCOSEU NEGATIVE 03/08/2012 2010   HGBUR TRACE* 03/08/2012 2010   BILIRUBINUR NEGATIVE 03/08/2012 2010   KETONESUR 40* 03/08/2012 2010   PROTEINUR NEGATIVE 03/08/2012 2010   UROBILINOGEN 4.0* 03/08/2012 2010   NITRITE NEGATIVE 03/08/2012 2010   LEUKOCYTESUR TRACE* 03/08/2012 2010     Prenatal labs: ABO, Rh:   Antibody:   Rubella:   RPR:    HBsAg:    HIV:    GBS:     Assessment/Plan: IUP at 34.0 Preterm cx effacement, unchanged by BH ctx  Given 2L IVF, one with MVI, resulted in mostly uterine irritability now D/C home with preterm labor precautions Keep next scheduled visit at [facility] 03/08/2012, 7:35 PM

## 2012-03-08 NOTE — Progress Notes (Signed)
Written and verbal d/c instructions given and understanding voiced. 

## 2012-03-08 NOTE — ED Provider Notes (Signed)
Attestation of Attending Supervision of Advanced Practitioner: Evaluation and management procedures were performed by the PA/NP/CNM/OB Fellow under my supervision/collaboration. Chart reviewed, and agree with management and plan.  Jaynie Collins, M.D. 03/08/2012 11:07 PM

## 2012-03-08 NOTE — ED Notes (Signed)
Kim Shaw CNM in to see pt 

## 2012-03-08 NOTE — ED Notes (Signed)
Philipp Deputy CNM notified of pt's status and that u/a results back. Will look at lab results and fm strip and prob. D/c home.

## 2012-03-08 NOTE — ED Notes (Signed)
Up to BR to void

## 2012-03-08 NOTE — ED Notes (Signed)
Pt up to BR to void

## 2012-03-08 NOTE — Discharge Instructions (Signed)

## 2012-03-08 NOTE — Progress Notes (Signed)
Pt reports feeling ctx/tightening on and off q min or so for several hours. Has been on bedsrest for 1 week for having a fully effaced cervix.

## 2012-03-08 NOTE — Progress Notes (Signed)
Up to BR.

## 2012-03-22 ENCOUNTER — Inpatient Hospital Stay (HOSPITAL_COMMUNITY)
Admission: AD | Admit: 2012-03-22 | Discharge: 2012-03-23 | DRG: 778 | Disposition: A | Discharge status: Home / Self Care | Payer: Medicaid Other | Source: Ambulatory Visit | Attending: Obstetrics & Gynecology | Admitting: Obstetrics & Gynecology

## 2012-03-22 ENCOUNTER — Encounter (HOSPITAL_COMMUNITY): Payer: Self-pay

## 2012-03-22 DIAGNOSIS — E860 Dehydration: Secondary | ICD-10-CM | POA: Diagnosis present

## 2012-03-22 DIAGNOSIS — O322XX0 Maternal care for transverse and oblique lie, not applicable or unspecified: Secondary | ICD-10-CM | POA: Diagnosis present

## 2012-03-22 DIAGNOSIS — IMO0001 Reserved for inherently not codable concepts without codable children: Secondary | ICD-10-CM

## 2012-03-22 DIAGNOSIS — O4700 False labor before 37 completed weeks of gestation, unspecified trimester: Principal | ICD-10-CM | POA: Diagnosis present

## 2012-03-22 DIAGNOSIS — O99840 Bariatric surgery status complicating pregnancy, unspecified trimester: Secondary | ICD-10-CM

## 2012-03-22 DIAGNOSIS — Z3400 Encounter for supervision of normal first pregnancy, unspecified trimester: Secondary | ICD-10-CM

## 2012-03-22 DIAGNOSIS — O479 False labor, unspecified: Secondary | ICD-10-CM

## 2012-03-22 DIAGNOSIS — E669 Obesity, unspecified: Secondary | ICD-10-CM

## 2012-03-22 LAB — TYPE AND SCREEN
ABO/RH(D): O POS
Antibody Screen: NEGATIVE

## 2012-03-22 LAB — CBC
MCV: 92.2 fL (ref 78.0–100.0)
Platelets: 173 10*3/uL (ref 150–400)
RBC: 3.48 MIL/uL — ABNORMAL LOW (ref 3.87–5.11)
RDW: 13 % (ref 11.5–15.5)
WBC: 8.6 10*3/uL (ref 4.0–10.5)

## 2012-03-22 LAB — URINALYSIS, ROUTINE W REFLEX MICROSCOPIC
Glucose, UA: NEGATIVE mg/dL
Protein, ur: NEGATIVE mg/dL
Specific Gravity, Urine: >1.03 — ABNORMAL HIGH (ref 1.005–1.030)
Urobilinogen, UA: 4 mg/dL — ABNORMAL HIGH (ref 0.0–1.0)

## 2012-03-22 LAB — URINE MICROSCOPIC-ADD ON

## 2012-03-22 LAB — STREP B DNA PROBE

## 2012-03-22 MED ORDER — LIDOCAINE HCL (PF) 1 % IJ SOLN: 30.0000 mL | INTRAMUSCULAR | Status: DC | PRN

## 2012-03-22 MED ORDER — CEFAZOLIN SODIUM 1-5 GM-% IV SOLN
1.0000 g | Freq: Once | INTRAVENOUS | Status: AC
  Administered 2012-03-22: 1 g via INTRAVENOUS
  Filled 2012-03-22: qty 50

## 2012-03-22 MED ORDER — CITRIC ACID-SODIUM CITRATE 334-500 MG/5ML PO SOLN: 30.0000 mL | ORAL | Status: DC | PRN

## 2012-03-22 MED ORDER — LACTATED RINGERS IV SOLN
INTRAVENOUS | Status: DC
  Administered 2012-03-23 (×2): via INTRAVENOUS

## 2012-03-22 MED ORDER — OXYTOCIN BOLUS FROM INFUSION
500.0000 mL | Freq: Once | INTRAVENOUS | Status: DC
  Filled 2012-03-22: qty 500

## 2012-03-22 MED ORDER — ONDANSETRON HCL 4 MG/2ML IJ SOLN
4.0000 mg | Freq: Four times a day (QID) | INTRAMUSCULAR | Status: DC | PRN
  Administered 2012-03-22: 4 mg via INTRAVENOUS
  Filled 2012-03-22: qty 2

## 2012-03-22 MED ORDER — ACETAMINOPHEN 325 MG PO TABS: 650.0000 mg | ORAL_TABLET | ORAL | Status: DC | PRN

## 2012-03-22 MED ORDER — IBUPROFEN 600 MG PO TABS: 600.0000 mg | ORAL_TABLET | Freq: Four times a day (QID) | ORAL | Status: DC | PRN

## 2012-03-22 MED ORDER — NALBUPHINE SYRINGE 5 MG/0.5 ML
5.0000 mg | INJECTION | INTRAMUSCULAR | Status: DC | PRN
  Administered 2012-03-22 – 2012-03-23 (×2): 5 mg via INTRAVENOUS
  Filled 2012-03-22 (×2): qty 0.5

## 2012-03-22 MED ORDER — LACTATED RINGERS IV BOLUS (SEPSIS)
1000.0000 mL | Freq: Once | INTRAVENOUS | Status: AC
  Administered 2012-03-22: 1000 mL via INTRAVENOUS

## 2012-03-22 MED ORDER — OXYTOCIN 20 UNITS IN LACTATED RINGERS INFUSION - SIMPLE: 125.0000 mL/h | Freq: Once | INTRAVENOUS | Status: DC

## 2012-03-22 MED ORDER — TERBUTALINE SULFATE 1 MG/ML IJ SOLN
0.2500 mg | Freq: Once | INTRAMUSCULAR | Status: AC
  Administered 2012-03-22: 0.25 mg via SUBCUTANEOUS
  Filled 2012-03-22 (×2): qty 1

## 2012-03-22 MED ORDER — OXYCODONE-ACETAMINOPHEN 5-325 MG PO TABS: 1.0000 | ORAL_TABLET | ORAL | Status: DC | PRN

## 2012-03-22 MED ORDER — FLEET ENEMA 7-19 GM/118ML RE ENEM: 1.0000 | ENEMA | RECTAL | Status: DC | PRN

## 2012-03-22 MED ORDER — LACTATED RINGERS IV SOLN: 500.0000 mL | INTRAVENOUS | Status: DC | PRN

## 2012-03-22 NOTE — Progress Notes (Signed)
Patient ID: [name], female   DOB: 1976-08-31, 36 y.o.   MRN: [id number]   Version:  Pt consented for version.  Bedside US shows baby not breech.  Terbutaline given, IV started, OR notified of pt's presence on the floor.    Baby turned to vertex easily.  FHT remained reassuring throughout procedure.    Cervix 4/70/-3 Rpt cervix exam one hour later--baby still vertex and 4-5/70/-3  Ancef IV for GBS risk factors (culture not done in office yet). SCDs

## 2012-03-22 NOTE — MAU Provider Note (Signed)
[name] y.o.G3P2002 @[redacted]w[redacted]d   CC: contractions   SUBJECTIVE  HPI: She describes having had a run of painful contractions during the night, and all day today she's been having crampy abdominal pain. Denies leakage of fluid or vaginal bleeding. Denies dysuria, urinary urgency or frequency. Good fetal movement. Reports good fluid intake and no vomiting.  P[facility]atal [facility] at [facility]. Had gastric bypass 3 months before pregnancy. At 32 weeks cervix was noted to be shortened to 6 mm she then got betamethasone and Prometrium.  Past Medical History  Diagnosis Date  . Obesity     gastric Bypass, Roux-en Y   Past Surgical History  Procedure Date  . Gastric bypass 04/20/2011    [place], wt 271lb preop  . Fertility surgery 2009    No abnormalities in female   History   Social History  . Marital Status: Legally Separated    Spouse Name: N/A    Number of Children: 2  . Years of Education: N/A   Occupational History  . 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 Smoking status: Never Smoker   . Smokeless tobacco: Never Used  . Alcohol Use: No  . Drug Use: No  . Sexually Active: Yes -- Female partner(s)     single possible conception date July 22, 2011   Other Topics Concern  . Not on file   Social History Narrative  . No narrative on file   No current facility-administered medications on file prior to encounter.   Current Outpatient Prescriptions on File Prior to Encounter  Medication Sig Dispense Refill  . cholecalciferol (VITAMIN D) 1000 UNITS tablet Take 1,000 Units by mouth daily.        . potassium chloride (MICRO-K) 10 MEQ CR capsule Take 1 capsule (10 mEq total) by mouth 2 (two) times daily.  60 capsule  3  . Prenatal Vit-Fe Fumarate-FA (PRENATAL MULTIVITAMIN) TABS Take 1 tablet by mouth daily.      . progesterone 200 MG SUPP Place 200 mg vaginally at bedtime.      . thiamine 100 MG tablet Take 100 mg by  mouth every 7 (seven) days. On Mondays of each week       . Cyanocobalamin (VITAMIN B-12 IJ) Inject 1 each as directed every 30 (thirty) days. Vitamin B-12 1059mcg/ml Injection        Allergies  Allergen Reactions  . Morphine And Related Other (See Comments)    Internal burning sensation post surgical prcedure  . Penicillins Itching and Swelling    ROS: Pertinent items in HPI  OBJECTIVE Blood pressure 89/59, pulse 98, temperature 97 F (36.1 C), temperature source Oral, resp. rate 18, last menstrual period 06/25/2011. GENERAL: Well-developed, well-nourished female in no acute distress.  ABDOMEN: Soft, nontender EXTREMITIES: Nontender, no edema EXTERNAL GENITALIA: normal VAGINA: physiologic discharge CERVIX: 2/90/OOP per RN at 1830  FHR: 145-150, reactive without decelerations CONTRACTIONS: low amplitude 30 sec contractions every 3-4 minute  LAB RESULTS Results for orders placed during the hospital encounter of 03/22/12 (from the past 24 hour(s))  URINALYSIS, ROUTINE W REFLEX MICROSCOPIC     Status: Abnormal   Collection Time   03/22/12  4:55 PM      Component Value Range   Color, Urine YELLOW  YELLOW    APPearance HAZY (*) CLEAR    Specific Gravity, Urine >1.030 (*) 1.005 - 1.030    pH 6.0  5.0 - 8.0  Glucose, UA NEGATIVE  NEGATIVE (mg/dL)   Hgb urine dipstick NEGATIVE  NEGATIVE    Bilirubin Urine SMALL (*) NEGATIVE    Ketones, ur 15 (*) NEGATIVE (mg/dL)   Protein, ur NEGATIVE  NEGATIVE (mg/dL)   Urobilinogen, UA 4.0 (*) 0.0 - 1.0 (mg/dL)   Nitrite NEGATIVE  NEGATIVE    Leukocytes, UA SMALL (*) NEGATIVE   URINE MICROSCOPIC-ADD ON     Status: Abnormal   Collection Time   03/22/12  4:55 PM      Component Value Range   Squamous Epithelial / LPF FEW (*) RARE    WBC, UA 7-10  <3 (WBC/hpf)   RBC / HPF 3-6  <3 (RBC/hpf)   Bacteria, UA MANY (*) RARE    MAU COURSE  Bedside US by me: Back up transverse lie with head to maternal rt. Normal AFV. IVF bolus and recheck in 1.5  hrs.   ASSESSMENT G3P2002 at 36.0 wks Back up transverse lie PT UCs Mild dehydration   PLAN  36 yo G3P2 at 36 weeks preterm labor Cervix 2-3/50 US--Transverse back down now (pt felt baby flip) Pt consented for version.  Risks include but not limited to placental abruption and fetal distress requiring emergency c/s. Pt to be admitted to L & D.  Terb prior.  NPO.

## 2012-03-22 NOTE — H&P (Signed)
Darlene J Underhill35 y.o.G3P2002 @[redacted]w[redacted]d  CC: contractions   SUBJECTIVE  HPI: She describes having had a run of painful contractions during the night, and all day today she's been having crampy abdominal pain. Denies leakage of fluid or vaginal bleeding. Denies dysuria, urinary urgency or frequency. Good fetal movement. Reports good fluid intake and no vomiting.  Prenatal care at [facility]. Had gastric bypass 3 months before pregnancy. At 32 weeks cervix was noted to be shortened to 6 mm she then got betamethasone and Prometrium.  Past Medical History  Diagnosis Date  . Obesity     gastric Bypass, Roux-en Y   Past Surgical History  Procedure Date  . Gastric bypass 04/20/2011    [place], wt 271lb preop  . Fertility surgery 2009    No abnormalities in female   History   Social History  . Marital Status: Legally Separated    Spouse Name: N/A    Number of Children: 2  . Years of Education: N/A   Occupational History  . 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 Smoking status: Never Smoker   . Smokeless tobacco: Never Used  . Alcohol Use: No  . Drug Use: No  . Sexually Active: Yes -- Female partner(s)     single possible conception date July 22, 2011   Other Topics Concern  . Not on file   Social History Narrative  . No narrative on file   No current facility-administered medications on file prior to encounter.   Current Outpatient Prescriptions on File Prior to Encounter  Medication Sig Dispense Refill  . cholecalciferol (VITAMIN D) 1000 UNITS tablet Take 1,000 Units by mouth daily.        . potassium chloride (MICRO-K) 10 MEQ CR capsule Take 1 capsule (10 mEq total) by mouth 2 (two) times daily.  60 capsule  3  . Prenatal Vit-Fe Fumarate-FA (PRENATAL MULTIVITAMIN) TABS Take 1 tablet by mouth daily.      . progesterone 200 MG SUPP Place 200 mg vaginally at bedtime.      . thiamine 100 MG tablet Take 100 mg by  mouth every 7 (seven) days. On Mondays of each week       . Cyanocobalamin (VITAMIN B-12 IJ) Inject 1 each as directed every 30 (thirty) days. Vitamin B-12 1000mcg/ml Injection        Allergies  Allergen Reactions  . Morphine And Related Other (See Comments)    Internal burning sensation post surgical prcedure  . Penicillins Itching and Swelling    ROS: Pertinent items in HPI  OBJECTIVE Blood pressure 89/59, pulse 98, temperature 97 F (36.1 C), temperature source Oral, resp. rate 18, last menstrual period 06/25/2011. GENERAL: Well-developed, well-nourished female in no acute distress.  ABDOMEN: Soft, nontender EXTREMITIES: Nontender, no edema EXTERNAL GENITALIA: normal VAGINA: physiologic discharge CERVIX: 2/90/OOP per RN at 1830  FHR: 145-150, reactive without decelerations CONTRACTIONS: low amplitude 30 sec contractions every 3-4 minute  LAB RESULTS Results for orders placed during the hospital encounter of 03/22/12 (from the past 24 hour(s))  URINALYSIS, ROUTINE W REFLEX MICROSCOPIC     Status: Abnormal   Collection Time   03/22/12  4:55 PM      Component Value Range   Color, Urine YELLOW  YELLOW    APPearance HAZY (*) CLEAR    Specific Gravity, Urine >1.030 (*) 1.005 - 1.030    pH 6.0  5.0 - 8.0      Glucose, UA NEGATIVE  NEGATIVE (mg/dL)   Hgb urine dipstick NEGATIVE  NEGATIVE    Bilirubin Urine SMALL (*) NEGATIVE    Ketones, ur 15 (*) NEGATIVE (mg/dL)   Protein, ur NEGATIVE  NEGATIVE (mg/dL)   Urobilinogen, UA 4.0 (*) 0.0 - 1.0 (mg/dL)   Nitrite NEGATIVE  NEGATIVE    Leukocytes, UA SMALL (*) NEGATIVE   URINE MICROSCOPIC-ADD ON     Status: Abnormal   Collection Time   03/22/12  4:55 PM      Component Value Range   Squamous Epithelial / LPF FEW (*) RARE    WBC, UA 7-10  <3 (WBC/hpf)   RBC / HPF 3-6  <3 (RBC/hpf)   Bacteria, UA MANY (*) RARE    MAU COURSE  Bedside US by me: Back up transverse lie with head to maternal rt. Normal AFV. IVF bolus and recheck in 1.5  hrs.   ASSESSMENT G3P2002 at 36.0 wks Back up transverse lie PT UCs Mild dehydration   PLAN  35 yo G3P2 at 36 weeks preterm labor Cervix 2-3/50 US--Transverse back down now (pt felt baby flip) Pt consented for version.  Risks include but not limited to placental abruption and fetal distress requiring emergency c/s. Pt to be admitted to L & D.  Terb prior.  NPO.         

## 2012-03-23 DIAGNOSIS — O479 False labor, unspecified: Secondary | ICD-10-CM

## 2012-03-23 LAB — ABO/RH: ABO/RH(D): O POS

## 2012-03-23 MED ORDER — NALBUPHINE SYRINGE 5 MG/0.5 ML
10.0000 mg | INJECTION | Freq: Once | INTRAMUSCULAR | Status: AC
  Administered 2012-03-23: 10 mg via INTRAVENOUS
  Filled 2012-03-23: qty 1

## 2012-03-23 MED ORDER — HYDROXYZINE HCL 50 MG/ML IM SOLN
50.0000 mg | Freq: Once | INTRAMUSCULAR | Status: AC
  Administered 2012-03-23: 50 mg via INTRAMUSCULAR
  Filled 2012-03-23: qty 1

## 2012-03-23 MED ORDER — NALBUPHINE SYRINGE 5 MG/0.5 ML
10.0000 mg | INJECTION | Freq: Once | INTRAMUSCULAR | Status: AC
  Administered 2012-03-23: 10 mg via INTRAMUSCULAR
  Filled 2012-03-23: qty 1

## 2012-03-23 NOTE — Discharge Instructions (Signed)
External Cephalic Version External cephalic version is turning a baby that is presenting their buttocks first (breech) or is lying sideways in the uterus (transverse) to a head-first position. This makes the labor and delivery faster, safer for the mother and baby, and lessens the chance for a Cesarean section. It should not be tried until the pregnancy is [redacted] weeks along or longer. BEFORE THE PROCEDURE   Do not take aspirin.   Do not eat for 4 hours before the procedure.   Tell your caregiver if you have a cold, fever or an infection.   Tell your caregiver if you are having contractions.   Tell your caregiver if you are leaking or had a gush of fluid from your vagina.   Tell your caregiver if you have any vaginal bleeding or abnormal discharge.   If you are being admitted the same day, arrive at the hospital at least one hour before the procedure to sign any necessary documents and to get prepared for the procedure.   Tell your caregiver if you had any problems with anesthetics in the past.   Tell your caregiver if you are taking any medications that your caregiver does not know about. This includes over-the-counter and prescription drugs, herbs, eye drops and creams.  PROCEDURE  First, an ultrasound is done to make sure the baby is breech or transverse.   A non-stress test or biophysical profile is done on the baby before the ECV. This is done to make sure it is safe for the baby to have the ECV. It may also be done after the procedure to make sure the baby is OK.   ECV is done in the delivery/surgical room with an anesthesiologist present. There should be a setup for an emergency Cesarean section with a full nursing and nursery staff available and ready.   The patient may be given a medication to relax the uterine muscles. An epidural may be given for any discomfort. It is helpful for the success of the ECV.   An electronic fetal monitor is placed on the uterus during the procedure  to make sure the baby is OK.   If the mother is Rh negative, Rho-gam will be given to her to prevent Rh problems for future pregnancies.   The mother is followed closely for 2 to 3 hours after the procedure to make sure no problems develop.  BENEFITS OF ECV  Easier and safer labor and delivery for the mother and baby.   Lower incidence of Cesarean section.   Lower costs with a vaginal delivery.  RISKS OF ECV  The placenta pulls away from the wall of the uterus before delivery (abruption of the placenta).   Rupture of the uterus, especially in patients with a previous Cesarean section.   Fetal distress.   Early (premature) labor.   Premature rupture of the membranes.   The baby will return to the breech or transverse lie position.   Death of the fetus can happen, but is very rare.  ECV SHOULD BE STOPPED IF:  The fetal heart tones drop.   The mother is having a lot of pain.   You cannot turn the baby after several attempts.  ECV SHOULD NOT BE DONE IF:  The non-stress test or biophysical profile is abnormal.   There is vaginal bleeding.   An abnormal shaped uterus is present.   There is heart disease or uncontrolled high blood pressure in the mother.   There are twins or more.     The placenta covers the opening of the cervix (placenta previa).   You had a previous cesarean section with a classical incision or major surgery of the uterus.   There is not enough amniotic fluid in the sac (oligohydramnios).   The baby is too small for the pregnancy or has not developed normally (anomaly).   Your membranes have ruptured.  HOME CARE INSTRUCTIONS   Have someone take you home after the procedure.   Rest at home for several hours.   Have someone stay with you for a few hours after you get home.   After ECV, continue with your prenatal visits as directed.   Continue your regular diet, rest and activities.   Do not do any strenuous activities for a couple of days.    SEEK IMMEDIATE MEDICAL CARE IF:   You develop vaginal bleeding.   You have fluid coming out of your vagina (bag of water may have broken).   You develop uterine contractions.   You do not feel the baby move or there is less movement of the baby.   You develop abdominal pain.   You develop an oral temperature of 102 F (38.9 C) or higher.  Document Released: 06/11/2007 Document Revised: 12/06/2011 Document Reviewed: 04/06/2009 [employer] Patient Information 2012 [employer]. 

## 2012-03-23 NOTE — Discharge Summary (Signed)
  Obstetric Discharge Summary Reason for Admission: contractions, rule out labor, and transverse presentation Prenatal Procedures: ultrasound Prenatal procedure during this admission: External cephalic Version x 2 Hemoglobin  Date Value Range Status  03/22/2012 10.8* 12.0-15.0 (g/dL) Final     HCT  Date Value Range Status  03/22/2012 32.1* 36.0-46.0 (%) Final    Physical Exam:  General: Asleep, wakes to voice SVE: 3-4/80%/-3 FHT's: baseline 140's, Moderate variability, + accelerations, no decelerations, Category I tracing Toco: Few, irregular contractions every 7-10 minutes  Discharge Diagnoses: False labor, Malpresentation of  fetus s/p Cephalic version, now vertex presentation  Discharge Information: Date: 03/23/2012 Activity: pelvic rest Diet: routine Medications: PNV Condition: stable Instructions: Labor precautions, fetal movement reviewed, patient instructed to return if contractions return Discharge to: home Follow-up Information    Follow up with [name], MD. (At next scheduled appointment)    Contact information:   [facility] [address] [place] [address] [phone]          Zebulun Deman 03/23/2012, 1:11 PM

## 2012-03-23 NOTE — Progress Notes (Signed)
  Subjective: Pt reports same level of pain.    Objective: BP 112/66  Pulse 74  Temp(Src) 98.2 F (36.8 C) (Oral)  Resp 16  Ht 5\' 5"  (1.651 m)  Wt 74.844 kg (165 lb)  BMI 27.46 kg/m2  SpO2 97%  LMP 06/25/2011      FHT:  FHR: 140's bpm, variability: moderate,  accelerations:  Present,  decelerations:  Absent UC:   regular, every 4-5 minutes SVE:   Dilation: 4.5 Effacement (%): 70;80 Station: Ballotable Exam by:: Roney Marion, CNM Bedside scan by Dr. Penne Lash; pt unstable lie, version to vertex, confirmed by ultrasound. Labs: Lab Results  Component Value Date   WBC 8.6 03/22/2012   HGB 10.8* 03/22/2012   HCT 32.1* 03/22/2012   MCV 92.2 03/22/2012   PLT 173 03/22/2012    Assessment / Plan: Preterm contractions  Labor: Cervix - unchanged Preeclampsia:  n/a Fetal Wellbeing:  Category I Pain Control:  Labor support without medications  Continue observation x 24 hrs, if cervix remains unchanged may discharge home with version at later gestation if needed.    [facility] 03/23/2012, 6:29 AM

## 2012-03-23 NOTE — Progress Notes (Signed)
Subjective: Patient more comfortable after receiving nubain. Feeling contractions every 4 minutes or so. Pressure more pronounced when the version occurred earlier. No recent changes. Headache. No visual changes.  Objective: BP 111/73  Pulse 93  Temp(Src) 97.9 F (36.6 C) (Oral)  Resp 18  Ht 5\' 5"  (1.651 m)  Wt 74.844 kg (165 lb)  BMI 27.46 kg/m2  SpO2 100%  LMP 06/25/2011      FHT:  FHR: 150 bpm, variability: moderate,  accelerations:  Present,  decelerations:  Absent UC:   Difficult to appreciate on monitor, none prior, a few every 4 minutes recently SVE:   Dilation: 4.5 Effacement (%): 90 Station: -2;Ballotable Exam by:: Lilli Few, RN  Labs: Lab Results  Component Value Date   WBC 8.6 03/22/2012   HGB 10.8* 03/22/2012   HCT 32.1* 03/22/2012   MCV 92.2 03/22/2012   PLT 173 03/22/2012    Assessment / Plan: Spontaneous preterm labor S/p terbutaline and version earlier Ancef for GBS unknown with risk factors  Labor: Progressing normally, continue to monitor Preeclampsia:  n/a Fetal Wellbeing:  Category I Pain Control:  Nubain I/D:  n/a Anticipated MOD:  NSVD  Ala Dach 03/23/2012, 12:07 AM

## 2012-03-23 NOTE — Progress Notes (Signed)
   Subjective: Reports no change in intensity of contractions.  Objective: BP 107/75  Pulse 114  Temp(Src) 98.1 F (36.7 C) (Oral)  Resp 18  Ht 5\' 5"  (1.651 m)  Wt 74.844 kg (165 lb)  BMI 27.46 kg/m2  SpO2 100%  LMP 06/25/2011      FHT:  FHR: 130's bpm, variability: moderate,  accelerations:  Present,  decelerations:  Absent UC:   irregular, every 2-6 minutes SVE:   Dilation: 4.5 Effacement (%): 70;80 Station: Ballotable;-2 Exam by:: Lilli Few, RN Bedside ultrasound - vertex Labs: Lab Results  Component Value Date   WBC 8.6 03/22/2012   HGB 10.8* 03/22/2012   HCT 32.1* 03/22/2012   MCV 92.2 03/22/2012   PLT 173 03/22/2012    Assessment / Plan: Preterm Labor  Labor: Unchanged Preeclampsia:  n/a Fetal Wellbeing:  Category I Pain Control:  Nubain I/D:  n/a Anticipated MOD:  NSVD  [facility] 03/23/2012, 2:47 AM

## 2012-03-23 NOTE — Discharge Summary (Signed)
Attestation of Attending Supervision of Resident: Evaluation and management procedures were performed by the [facility] Resident under my supervision.  I have evaluated the patient, reviewed the resident's note and chart, and I agree with management and plan.   Jaynie Collins, M.D. 03/23/2012 4:12 PM

## 2012-03-23 NOTE — Progress Notes (Signed)
Patient ID: LILLION ELBERT, female   DOB: 1976/04/25, 36 y.o.   MRN: [id number]  Subjective: Pt is sleeping comfortably, feeling minimal contractions.   Objective: BP 110/67  Pulse 80  Temp(Src) 98 F (36.7 C) (Oral)  Resp 16  Ht 5\' 5"  (1.651 m)  Wt 165 lb (74.844 kg)  BMI 27.46 kg/m2  SpO2 97%  LMP 06/25/2011      FHT:  FHR: 140's bpm, variability: moderate,  accelerations:  Present,  decelerations:  Absent UC:   regular, every 4-5 minutes SVE:   Dilation: 3.5 Effacement (%): 80 Station: -3 Exam by:: Dr Lula Olszewski Bedside scan shows vertex presentation.  Labs: Lab Results  Component Value Date   WBC 8.6 03/22/2012   HGB 10.8* 03/22/2012   HCT 32.1* 03/22/2012   MCV 92.2 03/22/2012   PLT 173 03/22/2012    Assessment / Plan: Preterm contractions, no cervical change.   Labor: Cervix - unchanged Preeclampsia:  n/a Fetal Wellbeing:  Category I Pain Control:  Labor support without medications  Patient with decreased contractions, and no cervical change.  However, Vertex by exam and bedside ultrasound prior to discharge.  Will discharge home.   Zelia Yzaguirre 03/23/2012, 1:09 PM

## 2012-03-23 NOTE — Progress Notes (Signed)
Pt called out and stated something felt different and she felt like the baby had moved again.  Dr. Penne Lash and [place], CNM @ bedside.  Bedside u/s done and baby no longer vertex.  Dr. Penne Lash did a second version and baby was confirmed to be vertex by u/s.  Abdominal binder put on again.  Dr. Penne Lash states that she is unable to induce due to preterm status and that if her cervix had not changed she may be sent home.

## 2012-03-28 ENCOUNTER — Encounter (HOSPITAL_COMMUNITY): Payer: Self-pay

## 2012-03-28 ENCOUNTER — Inpatient Hospital Stay (HOSPITAL_COMMUNITY)
Admission: AD | Admit: 2012-03-28 | Discharge: 2012-03-28 | Disposition: A | Discharge status: Home / Self Care | Payer: Medicaid Other | Source: Ambulatory Visit | Attending: Obstetrics & Gynecology | Admitting: Obstetrics & Gynecology

## 2012-03-28 ENCOUNTER — Inpatient Hospital Stay (HOSPITAL_COMMUNITY): Payer: Medicaid Other

## 2012-03-28 DIAGNOSIS — O479 False labor, unspecified: Secondary | ICD-10-CM | POA: Insufficient documentation

## 2012-03-28 DIAGNOSIS — O368190 Decreased fetal movements, unspecified trimester, not applicable or unspecified: Secondary | ICD-10-CM | POA: Insufficient documentation

## 2012-03-28 NOTE — Discharge Instructions (Signed)
Fetal Biophysical Profile This is a test that measures five different variables of the fetus: Heart rate, breathing movement, total movement of the baby, fetal muscle tone, the amount of amniotic fluid, and the heart rate activity of the fetus. The five variables are measured individually and contribute either a 2 or a 0 to the overall scoring of the test. The measurements are as follows:  Fetal heart rate activity. This is measured and scored in the same way as a non-stress test. The fetal heart rate is considered reactive when there are movement-associated fetal heart rate increases of at least 15 beats per minute above baseline, and 15 seconds in duration over a 20-minute period. A score of 2 is given for reactivity, and a score of 0 indicates that the fetal heart rate is non-reactive.   Fetal breathing movements. This is scored based on fetal breathing movements and indicate fetal well-being. Their absence may indicate a low oxygen level for the fetus. Fetal breathing increases in frequency and uniformity after the 36th week of pregnancy. To earn a score of 2, the fetus must have at least one episode of fetal breathing lasting at least 60 seconds within a 30-minute observation. Absence of this breathing is scored a 0 on the BPP.   Fetal body movements. Fetal activity is a reflection of brain integrity and function. The presence of at least three episodes of fetal movements within a 30-minute period is given a score of 2. A score of 0 is given with two or less movements in this time period. Fetal activity is highest 1 to 3 hours after the mother has eaten a meal.   Fetal tone. In the uterus, the fetus is normally in a position of flexion. This means the head is bent down towards the knees. The fetus also stretches, rolls, and moves in the uterus. The arms, legs, trunk, and head may be flexed and extended. A score of 2 is earned when there is at least one episode of active extension with return flexion. A  score of 0 is given for slow extension with a return to only partial flexion. Fetal movement not followed by return to flexion, limbs or spine in extension, and an open fetal hand score 0.   Amniotic fluid volume. Amniotic fluid volume has been demonstrated to be a good method of predicting fetal distress. Too little amniotic fluid has been associated with fetal abnormalities, slow uterine growth, and over due pregnancy. A score of 2 is given for this when there is at least one pocket of amniotic fluid that measures 1 cm in a specific area. A score of 0 indicates either that fluid is absent in most areas of the uterine cavity or that the largest pocket of fluid measures less than 1 cm.  PREPARATION FOR TEST No preparation or fasting is necessary. NORMAL FINDINGS  A score of 8-10 points (if amniotic fluid volume is adequate).   Possible critical values: Less than 4 may necessitate immediate delivery of fetus.  Ranges for normal findings may vary among different laboratories and hospitals. You should always check with your doctor after having lab work or other tests done to discuss the meaning of your test results and whether your values are considered within normal limits. MEANING OF TEST  Your caregiver will go over the test results with you and discuss the importance and meaning of your results, as well as treatment options and the need for additional tests if necessary. OBTAINING THE TEST RESULTS  It  is your responsibility to obtain your test results. Ask the lab or department performing the test when and how you will get your results. Document Released: 04/19/2005 Document Revised: 12/06/2011 Document Reviewed: 11/26/2008 [facility] Patient Information 2012 [place], [place].

## 2012-03-28 NOTE — MAU Note (Signed)
Contractions bad last two days slowed down a little today, decreased fetal movement 37 weeks.

## 2012-03-28 NOTE — MAU Provider Note (Signed)
History     CSN: [id number]  Arrival date and time: 03/28/12 1708   First Provider Initiated Contact with Patient 03/28/12 1743      Chief Complaint  Patient presents with  . Contractions  . Decreased Fetal Movement   HPI This is a 36 y.o. at [redacted]w[redacted]d who presents with c/o decreased fetal movement today. Had been seen here recently for a labor check and had to have a version twice that day due to malpresentation. States UCs come and go, less strong today.  OB History    Grav Para Term Preterm Abortions TAB SAB Ect Mult Living   3 2 2  0 0 0 0 0 0 2      Past Medical History  Diagnosis Date  . Obesity     gastric Bypass, Roux-en Y    Past Surgical History  Procedure Date  . Gastric bypass 04/20/2011    [place], wt 271lb preop  . Fertility surgery 2009    No abnormalities in female    Family History  Problem Relation Age of Onset  . Diabetes Father   . Hyperlipidemia Father   . Hypertension Father   . Cancer Father     skin cancer  . Depression Mother     lifelong  . Anesthesia problems Neg Hx     History  Substance Use Topics  . Smoking status: Never Smoker   . Smokeless tobacco: Never Used  . Alcohol Use: No    Allergies:  Allergies  Allergen Reactions  . Morphine And Related Other (See Comments)    Internal burning sensation post surgical prcedure  . Penicillins Itching and Swelling    Prescriptions prior to admission  Medication Sig Dispense Refill  . cholecalciferol (VITAMIN D) 1000 UNITS tablet Take 1,000 Units by mouth daily.        . Cyanocobalamin (VITAMIN B-12 IJ) Inject 1 each as directed every 30 (thirty) days. Vitamin B-12 1035mcg/ml Injection       . Ondansetron (ZOFRAN ODT PO) Take 1 tablet by mouth 2 (two) times daily as needed. Nausea; pt does not know strength      . potassium chloride (MICRO-K) 10 MEQ CR capsule Take 1 capsule (10 mEq total) by mouth 2 (two) times daily.  60 capsule  3  . Prenatal Vit-Fe Fumarate-FA (PRENATAL  MULTIVITAMIN) TABS Take 1 tablet by mouth daily.      . progesterone 200 MG SUPP Place 200 mg vaginally at bedtime.      . thiamine 100 MG tablet Take 100 mg by mouth every 7 (seven) days. On Mondays of each week         Review of Systems  Constitutional: Negative for fever.  Gastrointestinal: Negative for abdominal pain.       Decreased fetal movement  Neurological: Negative for weakness.    Physical Exam   Blood pressure 104/72, pulse 89, temperature 97.1 F (36.2 C), temperature source Oral, resp. rate 16, height 5\' 5"  (1.651 m), weight 163 lb 12.8 oz (74.299 kg), last menstrual period 06/25/2011.  Physical Exam  Constitutional: She is oriented to person, place, and time. She appears well-developed and well-nourished.  HENT:  Head: Normocephalic.  Cardiovascular: Normal rate.   Respiratory: Effort normal.  GI: Soft. She exhibits no distension. There is no tenderness. There is no rebound and no guarding.       [place]:  Fetus feels possibly transverse FHR nonreactive by criteria with uterine irritability  Musculoskeletal: Normal range of motion.  Neurological:  She is alert and oriented to person, place, and time.  Skin: Skin is warm and dry.  Psychiatric: She has a normal mood and affect.   Cervix 3-4/70/-3/Ballottable/feels vertex  MAU Course  Procedures  MDM Will get [place] to check BPP, AFI, and presentation. >>>  AFI = 19.05, BPP = 8/8, Vertex presentation. NST very reactive now.   Assessment and Plan  A:  SIUP at [redacted]w[redacted]d      Not in labor      Vertex presentation      Reassuring fetal status P:  D/C home      Labor and FM precautions  [facility] 03/28/2012, 5:48 PM

## 2012-03-30 ENCOUNTER — Encounter (HOSPITAL_COMMUNITY): Payer: Self-pay | Admitting: *Deleted

## 2012-03-30 ENCOUNTER — Inpatient Hospital Stay (HOSPITAL_COMMUNITY)
Admission: AD | Admit: 2012-03-30 | Discharge: 2012-03-30 | Disposition: A | Discharge status: Home / Self Care | Payer: Medicaid Other | Source: Ambulatory Visit | Attending: Obstetrics & Gynecology | Admitting: Obstetrics & Gynecology

## 2012-03-30 DIAGNOSIS — IMO0002 Reserved for concepts with insufficient information to code with codable children: Secondary | ICD-10-CM

## 2012-03-30 DIAGNOSIS — O479 False labor, unspecified: Secondary | ICD-10-CM | POA: Insufficient documentation

## 2012-03-30 DIAGNOSIS — O99840 Bariatric surgery status complicating pregnancy, unspecified trimester: Secondary | ICD-10-CM | POA: Insufficient documentation

## 2012-03-30 MED ORDER — TERBUTALINE SULFATE 1 MG/ML IJ SOLN
0.2500 mg | Freq: Once | INTRAMUSCULAR | Status: AC
  Administered 2012-03-30: 0.25 mg via SUBCUTANEOUS
  Filled 2012-03-30: qty 1

## 2012-03-30 NOTE — ED Notes (Signed)
EDD 04/19/2012 - reports had 3 large gushes of fluid PTA.  Pt reports went into labor x 1 wk ago and reports labor stopped and was sent home.  Pt denies any pain.  Reports started having contractions last night approx 3-4 min apart.

## 2012-03-30 NOTE — MAU Note (Signed)
Onset of contractions earlier with two gushes of fluid earlier went to [facility] was transported via ambulance to [facility] room 7 in MAU

## 2012-03-30 NOTE — Discharge Instructions (Signed)
If you have regular contractions, feel gush of fluid followed by steady trickle, vaginal bleeding or decreased movement, please return to be evaluated.  Your water is not broken today. Take care, and Happy [place]dis [place]!

## 2012-03-30 NOTE — ED Provider Notes (Signed)
History     CSN: [id number]  Arrival date & time 03/30/12  1444   First MD Initiated Contact with Patient 03/30/12 1458      Chief Complaint  Patient presents with  . ? water broke     (Consider location/radiation/quality/duration/timing/severity/associated sxs/prior treatment) The history is provided by the patient.   patient is [redacted] weeks pregnant with her third child. She states she's felt 3 rushes of fluid today and has had contractions about every 5-7 minutes. She still feels a baby boy moving. She states that last week she was seen at [facility] hospital for preterm labor and was sent home. She's had prenatal care. She states she knows it is not urine. She states she's dilated to 4-5 cm and 70% effaced last week.  Past Medical History  Diagnosis Date  . Obesity     gastric Bypass, Roux-en Y    Past Surgical History  Procedure Date  . Gastric bypass 04/20/2011    [place], [place] 271lb preop  . Fertility surgery 2009    No abnormalities in female    Family History  Problem Relation Age of Onset  . Diabetes Father   . Hyperlipidemia Father   . Hypertension Father   . Cancer Father     skin cancer  . Depression Mother     lifelong  . Anesthesia problems Neg Hx     History  Substance Use Topics  . Smoking status: Never Smoker   . Smokeless tobacco: Never Used  . Alcohol Use: No    OB History    Grav Para Term Preterm Abortions TAB SAB Ect Mult Living   3 2 2  0 0 0 0 0 0 2      Review of Systems  Constitutional: Negative for activity change and appetite change.  HENT: Negative for neck stiffness.   Eyes: Negative for pain.  Respiratory: Negative for chest tightness and shortness of breath.   Cardiovascular: Negative for chest pain and leg swelling.  Gastrointestinal: Negative for nausea, vomiting, abdominal pain and diarrhea.  Genitourinary: Negative for flank pain and vaginal pain.       Patient is  [redacted] weeks pregnant  Musculoskeletal: Negative for back  pain.  Skin: Negative for rash.  Neurological: Negative for weakness, numbness and headaches.  Psychiatric/Behavioral: Negative for behavioral problems.    Allergies  Morphine and related and Penicillins  Home Medications   Current Outpatient Rx  Name Route Sig Dispense Refill  . VITAMIN D 1000 UNITS PO TABS Oral Take 1,000 Units by mouth daily.      Marland Kitchen VITAMIN B-12 IJ Injection Inject 1 each as directed every 30 (thirty) days. Vitamin B-12 1034mcg/ml Injection     . POTASSIUM CHLORIDE 10 MEQ PO CPCR Oral Take 1 capsule (10 mEq total) by mouth 2 (two) times daily. 60 capsule 3  . PRENATAL MULTIVITAMIN CH Oral Take 1 tablet by mouth daily.    . THIAMINE HCL 100 MG PO TABS Oral Take 100 mg by mouth every 7 (seven) days. On Mondays of each week       LMP 06/25/2011  Physical Exam  Nursing note and vitals reviewed. Constitutional: She is oriented to person, place, and time. She appears well-developed and well-nourished.  HENT:  Head: Normocephalic and atraumatic.  Eyes: EOM are normal. Pupils are equal, round, and reactive to light.  Neck: Normal range of motion. Neck supple.  Cardiovascular: Normal rate, regular rhythm and normal heart sounds.   No murmur heard. Pulmonary/Chest:  Effort normal and breath sounds normal. No respiratory distress. She has no wheezes. She has no rales.  Abdominal: Soft. Bowel sounds are normal. She exhibits mass. She exhibits no distension. There is no tenderness. There is no rebound and no guarding.  Genitourinary:       Patient's approximately 4 cm dilated. No fluid sac was felt  Musculoskeletal: Normal range of motion.  Neurological: She is alert and oriented to person, place, and time. No cranial nerve deficit.  Skin: Skin is warm and dry.  Psychiatric: She has a normal mood and affect. Her speech is normal.    ED Course  Procedures (including critical care time)  Labs Reviewed - No data to display No results found.   1. Pregnancy  complicated by previous gastric bypass, antepartum       MDM  Patient is G4 P3 at 37 weeks. She felt a gush of fluid prior to arrival. She has had contractions approximately 5-7 minutes per patient. Pelvic exam showed stable dilatation compared to last week. I was not able to palpate an amniotic sac. Patient's pregnancy has been complicated by a gastric bypass right before the pregnancy. Patient was transferred down at [facility] hospital. Dr. Marice Potter is the accepting physician. Terbutaline has been given.        Juliet Rude. Rubin Payor, MD 03/30/12 1530

## 2012-03-30 NOTE — MAU Provider Note (Signed)
History     CSN: [id number]  Arrival date and time: 03/30/12 1444   First Provider Initiated Contact with Patient 03/30/12 1619      Chief Complaint  Patient presents with  . Labor Eval  . Rupture of Membranes   HPI Patient is a 36 yo G3P2002 at [redacted]w[redacted]d presenting via EMS for evaluation of labor. Patient was seen at [facility] after feeling "gushes of fluid" this morning. She states the fluid was clear. She was having some contractions with pain in her back all night. At [facility] she was 4cm and therefore sent to [facility] for further evaluation. She did receive Terb prior to transfer. Patient states she has good fetal movement. No bleeding or vaginal discharge.  Prenatal care at [facility]. No complications during pregnancy other than requiring version x2. She had gastric bypass surgery in 2012 prior to conception.    OB History    Grav Para Term Preterm Abortions TAB SAB Ect Mult Living   3 2 2  0 0 0 0 0 0 2      Past Medical History  Diagnosis Date  . Obesity     gastric Bypass, Roux-en Y    Past Surgical History  Procedure Date  . Gastric bypass 04/20/2011    [place] 271lb preop  . Fertility surgery 2009    No abnormalities in female    Family History  Problem Relation Age of Onset  . Diabetes Father   . Hyperlipidemia Father   . Hypertension Father   . Cancer Father     skin cancer  . Depression Mother     lifelong  . Anesthesia problems Neg Hx     History  Substance Use Topics  . Smoking status: Never Smoker   . Smokeless tobacco: Never Used  . Alcohol Use: No    Allergies:  Allergies  Allergen Reactions  . Morphine And Related Other (See Comments)    Internal burning sensation post surgical prcedure  . Penicillins Itching and Swelling     (Not in a hospital admission)  Review of Systems  Constitutional: Negative for fever and chills.  Respiratory: Negative for shortness of breath.   Cardiovascular: Negative for chest  pain.  Gastrointestinal: Negative for nausea, vomiting and abdominal pain.  Genitourinary: Negative for dysuria.  Musculoskeletal: Positive for back pain.  Skin: Negative for rash.  Neurological: Negative for dizziness and headaches.   Physical Exam   Blood pressure 107/69, pulse 103, temperature 97 F (36.1 C), temperature source Oral, resp. rate 16, last menstrual period 06/25/2011, SpO2 98.00%.  Physical Exam  Constitutional: She is oriented to person, place, and time. She appears well-developed and well-nourished. No distress.  HENT:  Head: Normocephalic and atraumatic.  Neck: Normal range of motion.  Cardiovascular: Normal rate.   Respiratory: Effort normal.  GI: Soft.       Gravid. Toco in place.  Genitourinary: Vagina normal.       No pooling noted on speculum exam. Moderate amount of thin white discharge. Cervix visualized and amnisure obtained. On cervical check, 4cm dilated with forebag noted.  Musculoskeletal: Normal range of motion. She exhibits no edema and no tenderness.  Neurological: She is alert and oriented to person, place, and time. No cranial nerve deficit.  Skin: Skin is dry. No rash noted.    MAU Course  Procedures Results for orders placed during the hospital encounter of 03/30/12 (from the past 24 hour(s))  AMNISURE RUPTURE OF MEMBRANE (ROM)  Status: Normal   Collection Time   03/30/12  4:20 PM      Component Value Range   Amnisure ROM NEGATIVE      MDM Patient not currently contracting on monitor, but she is s/p Terbutaline at [facility]. FHT reassuring. Dilation: 4 Effacement (%): 70 Exam by:: Rihanna Marseille MD Bulging bag still noted on exam Ferning slide negative Anmisure sent to lab   Assessment and Plan  36 yo G3P2002 at [redacted]w[redacted]d presenting for rule out labor - Amnisure negative. Bag of water felt on exam, and minimal change in her cervix since her last check. - Discussed signs of labor with patient and when she should return to be  evaluated. Discharged home in stable medical condition. - Plan discussed with Zerita Boers, CNM who agrees with discharge.  Torre Pikus 03/30/2012, 5:01 PM

## 2012-04-03 ENCOUNTER — Encounter (HOSPITAL_COMMUNITY): Payer: Self-pay | Admitting: *Deleted

## 2012-04-03 ENCOUNTER — Telehealth (HOSPITAL_COMMUNITY): Payer: Self-pay | Admitting: *Deleted

## 2012-04-03 ENCOUNTER — Inpatient Hospital Stay (HOSPITAL_COMMUNITY)
Admission: AD | Admit: 2012-04-03 | Discharge: 2012-04-06 | DRG: 775 | Disposition: A | Discharge status: Home / Self Care | Payer: Medicaid Other | Source: Ambulatory Visit | Attending: Obstetrics & Gynecology | Admitting: Obstetrics & Gynecology

## 2012-04-03 DIAGNOSIS — IMO0002 Reserved for concepts with insufficient information to code with codable children: Principal | ICD-10-CM | POA: Diagnosis present

## 2012-04-03 DIAGNOSIS — O322XX0 Maternal care for transverse and oblique lie, not applicable or unspecified: Secondary | ICD-10-CM

## 2012-04-03 LAB — CBC
MCH: 31.1 pg (ref 26.0–34.0)
MCV: 93.2 fL (ref 78.0–100.0)
Platelets: 161 10*3/uL (ref 150–400)
RBC: 3.38 MIL/uL — ABNORMAL LOW (ref 3.87–5.11)
RDW: 13.4 % (ref 11.5–15.5)

## 2012-04-03 MED ORDER — ACETAMINOPHEN 325 MG PO TABS
650.0000 mg | ORAL_TABLET | ORAL | Status: DC | PRN
  Administered 2012-04-04: 650 mg via ORAL
  Filled 2012-04-03: qty 2

## 2012-04-03 MED ORDER — LACTATED RINGERS IV SOLN
INTRAVENOUS | Status: DC
  Administered 2012-04-03: 21:00:00 via INTRAVENOUS

## 2012-04-03 MED ORDER — LACTATED RINGERS IV SOLN: 500.0000 mL | Freq: Once | INTRAVENOUS | Status: DC

## 2012-04-03 MED ORDER — PHENYLEPHRINE 40 MCG/ML (10ML) SYRINGE FOR IV PUSH (FOR BLOOD PRESSURE SUPPORT)
80.0000 ug | PREFILLED_SYRINGE | INTRAVENOUS | Status: DC | PRN
  Filled 2012-04-03: qty 5

## 2012-04-03 MED ORDER — LIDOCAINE HCL (PF) 1 % IJ SOLN
30.0000 mL | INTRAMUSCULAR | Status: DC | PRN
  Filled 2012-04-03: qty 30

## 2012-04-03 MED ORDER — EPHEDRINE 5 MG/ML INJ: 10.0000 mg | INTRAVENOUS | Status: DC | PRN

## 2012-04-03 MED ORDER — TERBUTALINE SULFATE 1 MG/ML IJ SOLN
0.2500 mg | Freq: Once | INTRAMUSCULAR | Status: AC
  Administered 2012-04-03: 0.25 mg via SUBCUTANEOUS
  Filled 2012-04-03: qty 1

## 2012-04-03 MED ORDER — EPHEDRINE 5 MG/ML INJ
10.0000 mg | INTRAVENOUS | Status: DC | PRN
  Filled 2012-04-03: qty 4

## 2012-04-03 MED ORDER — IBUPROFEN 600 MG PO TABS: 600.0000 mg | ORAL_TABLET | Freq: Four times a day (QID) | ORAL | Status: DC | PRN

## 2012-04-03 MED ORDER — FENTANYL 2.5 MCG/ML BUPIVACAINE 1/10 % EPIDURAL INFUSION (WH - ANES)
14.0000 mL/h | INTRAMUSCULAR | Status: DC
  Administered 2012-04-04 (×2): 14 mL/h via EPIDURAL
  Filled 2012-04-03 (×3): qty 60

## 2012-04-03 MED ORDER — OXYTOCIN 20 UNITS IN LACTATED RINGERS INFUSION - SIMPLE: 125.0000 mL/h | Freq: Once | INTRAVENOUS | Status: DC

## 2012-04-03 MED ORDER — OXYTOCIN 20 UNITS IN LACTATED RINGERS INFUSION - SIMPLE
1.0000 m[IU]/min | INTRAVENOUS | Status: DC
  Administered 2012-04-03: 2 m[IU]/min via INTRAVENOUS
  Administered 2012-04-04: 6 m[IU]/min via INTRAVENOUS
  Filled 2012-04-03: qty 1000

## 2012-04-03 MED ORDER — CITRIC ACID-SODIUM CITRATE 334-500 MG/5ML PO SOLN: 30.0000 mL | ORAL | Status: DC | PRN

## 2012-04-03 MED ORDER — FLEET ENEMA 7-19 GM/118ML RE ENEM: 1.0000 | ENEMA | RECTAL | Status: DC | PRN

## 2012-04-03 MED ORDER — ZOLPIDEM TARTRATE 10 MG PO TABS: 10.0000 mg | ORAL_TABLET | Freq: Every evening | ORAL | Status: DC | PRN

## 2012-04-03 MED ORDER — TERBUTALINE SULFATE 1 MG/ML IJ SOLN: 0.2500 mg | Freq: Once | INTRAMUSCULAR | Status: AC | PRN

## 2012-04-03 MED ORDER — DIPHENHYDRAMINE HCL 50 MG/ML IJ SOLN: 12.5000 mg | INTRAMUSCULAR | Status: DC | PRN

## 2012-04-03 MED ORDER — ONDANSETRON HCL 4 MG/2ML IJ SOLN
4.0000 mg | Freq: Four times a day (QID) | INTRAMUSCULAR | Status: DC | PRN
  Administered 2012-04-04 (×2): 4 mg via INTRAVENOUS
  Filled 2012-04-03 (×2): qty 2

## 2012-04-03 MED ORDER — PHENYLEPHRINE 40 MCG/ML (10ML) SYRINGE FOR IV PUSH (FOR BLOOD PRESSURE SUPPORT): 80.0000 ug | PREFILLED_SYRINGE | INTRAVENOUS | Status: DC | PRN

## 2012-04-03 MED ORDER — OXYTOCIN BOLUS FROM INFUSION
500.0000 mL | Freq: Once | INTRAVENOUS | Status: AC
  Administered 2012-04-04: 500 mL via INTRAVENOUS
  Filled 2012-04-03: qty 500

## 2012-04-03 MED ORDER — LACTATED RINGERS IV SOLN: 500.0000 mL | INTRAVENOUS | Status: DC | PRN

## 2012-04-03 NOTE — Progress Notes (Signed)
ECV successful.  Pt now in early labor. In light of unstable lie, will augment labor

## 2012-04-03 NOTE — H&P (Signed)
First Provider Initiated Contact with Patient 04/03/12 2056  Chief Complaint: possible labor, baby transverse  [name] is 36 y.o. G3P2002 at [redacted]w[redacted]d presents complaining of No chief complaint on file.  . She states regular, every 2-3 minutes contractions are associated with none vaginal bleeding, intact membranes, along with active fetal movement.  Her baby has had an unstable lie for several weeks now. Shehad a successful ECV ~ 2 weeks ago, but baby moved to transverse yesterday. Pt is very uncomfortable with this position and requests ECV. Her cervix has been 4/50/-2 for a week or so per pt  Menstrual History:  OB History    Grav  Para  Term  Preterm  Abortions  TAB  SAB  Ect  Mult  Living    3  2  2   0  0  0  0  0  0  2      Patient's last menstrual period was 06/25/2011.   Past Medical History:  Past Medical History   Diagnosis  Date   .  Obesity      gastric Bypass, Roux-en Y    Past Surgical History:  Past Surgical History   Procedure  Date   .  Gastric bypass  04/20/2011     [place], [place] 271lb preop   .  Fertility surgery  2009     No abnormalities in female    Family History:  Family History   Problem  Relation  Age of Onset   .  Diabetes  Father    .  Hyperlipidemia  Father    .  Hypertension  Father    .  Cancer  Father       skin cancer    .  Depression  Mother       lifelong    .  Anesthesia problems  Neg Hx     Social History:  History   Substance Use Topics   .  Smoking status:  Never Smoker   .  Smokeless tobacco:  Never Used   .  Alcohol Use:  No    Allergies:  Allergies   Allergen  Reactions   .  Morphine And Related  Other (See Comments)     Internal burning sensation post surgical prcedure   .  Penicillins  Itching and Swelling    Meds:  Prescriptions prior to admission   Medication  Sig  Dispense  Refill   .  cholecalciferol (VITAMIN D) 1000 UNITS tablet  Take 1,000 Units by mouth daily.     .  Cyanocobalamin (VITAMIN  B-12 IJ)  Inject 1 each as directed every 30 (thirty) days. Vitamin B-12 1044mcg/ml Injection     .  FOLIC ACID PO  Take 1 capsule by mouth daily.     .  potassium chloride (MICRO-K) 10 MEQ CR capsule  Take 1 capsule (10 mEq total) by mouth 2 (two) times daily.  60 capsule  3   .  Prenatal Vit-Fe Fumarate-FA (PRENATAL MULTIVITAMIN) TABS  Take 1 tablet by mouth daily.     Marland Kitchen  thiamine 100 MG tablet  Take 100 mg by mouth every 7 (seven) days. On Mondays of each week      Review of Systems - Please refer to the aforementioned patients' reports.  Physical Exam   Blood pressure 100/70, pulse 94, temperature 96.9 F (36.1 C), temperature source Oral, resp. rate 20, height 5\' 4"  (1.626 m), weight 74.844 kg (165 lb), last menstrual period  06/25/2011.  GENERAL: Well-developed, well-nourished female in no acute distress.  LUNGS: Clear to auscultation bilaterally.  HEART: Regular rate and rhythm.  ABDOMEN: Soft, nontender, nondistended, gravid.  EXTREMITIES: Nontender, no edema, 2+ distal pulses.  CERVICAL EXAM: Dilatation 4cm Effacement 80% Station out of pelvis  Presentation: transverse  FHT: Baseline rate 140 bpm Variability moderate Accelerations present Decelerations none  Contractions: Every 2-3 mins  Labs:  O+ Rubella immune HIV neg HbSAG neg RPR neg GBS neg GTT:  Unable to do d/t gastric bypass, but 1 week of fbs and pp all normal  Imaging Studies:  US Ob Limited  Assessment:  [name] is 36 y.o. G3P2002 at [redacted]w[redacted]d presents with possible early labor, unstable lie.  Plan:  Discussed with Dr. Marice Potter. Due to risk of prolapsed cord if SROM, will attempt ECV    [name],Darnita Woodrum  4/4/20139:17 PM

## 2012-04-03 NOTE — MAU Note (Signed)
PT HAS BEEN TRANVERSE.  Marland Kitchen  PT CAME HERE  AND VERSION ON 3-23.-- VE 4-5 CM.     PT SAYS YESTERRDAY- DR FERGUSON  SAID BABY IS TRANSVERSE AGAIN.   HE SAID IF UC STRONG - COME TO MAU.  UC HURT BAD LAST NIGHT.    PLAN TO HAVE ANOTHER VERSION ON Saturday..   DENIES HSV AND MRSA.

## 2012-04-03 NOTE — ED Provider Notes (Signed)
First Provider Initiated Contact with Patient 04/03/12 2056      Chief Complaint: possible labor, baby transverse  ANAYAH ARVANITIS is  36 y.o. G3P2002 at [redacted]w[redacted]d presents complaining of No chief complaint on file. .  She states regular, every 2-3 minutes contractions are associated with none vaginal bleeding, intact membranes, along with active fetal movement. Her baby has had an unstable lie for several weeks now. Shehad a successful ECV ~ 2 weeks ago, but baby moved to transverse yesterday.  Pt is very uncomfortable with this position and requests ECV.   Her cervix has been 4/50/-2 for a week or so per pt   Menstrual History: OB History    Grav Para Term Preterm Abortions TAB SAB Ect Mult Living   3 2 2  0 0 0 0 0 0 2      Patient's last menstrual period was 06/25/2011.     Past Medical History: Past Medical History  Diagnosis Date  . Obesity     gastric Bypass, Roux-en Y    Past Surgical History: Past Surgical History  Procedure Date  . Gastric bypass 04/20/2011    [place], [place] 271lb preop  . Fertility surgery 2009    No abnormalities in female    Family History: Family History  Problem Relation Age of Onset  . Diabetes Father   . Hyperlipidemia Father   . Hypertension Father   . Cancer Father     skin cancer  . Depression Mother     lifelong  . Anesthesia problems Neg Hx     Social History: History  Substance Use Topics  . Smoking status: Never Smoker   . Smokeless tobacco: Never Used  . Alcohol Use: No    Allergies:  Allergies  Allergen Reactions  . Morphine And Related Other (See Comments)    Internal burning sensation post surgical prcedure  . Penicillins Itching and Swelling    Meds:  Prescriptions prior to admission  Medication Sig Dispense Refill  . cholecalciferol (VITAMIN D) 1000 UNITS tablet Take 1,000 Units by mouth daily.        . Cyanocobalamin (VITAMIN B-12 IJ) Inject 1 each as directed every 30 (thirty) days. Vitamin B-12  1045mcg/ml Injection      . FOLIC ACID PO Take 1 capsule by mouth daily.      . potassium chloride (MICRO-K) 10 MEQ CR capsule Take 1 capsule (10 mEq total) by mouth 2 (two) times daily.  60 capsule  3  . Prenatal Vit-Fe Fumarate-FA (PRENATAL MULTIVITAMIN) TABS Take 1 tablet by mouth daily.      Marland Kitchen thiamine 100 MG tablet Take 100 mg by mouth every 7 (seven) days. On Mondays of each week        Review of Systems - Please refer to the aforementioned patients' reports.     Physical Exam  Blood pressure 100/70, pulse 94, temperature 96.9 F (36.1 C), temperature source Oral, resp. rate 20, height 5\' 4"  (1.626 m), weight 74.844 kg (165 lb), last menstrual period 06/25/2011. GENERAL: Well-developed, well-nourished female in no acute distress.  LUNGS: Clear to auscultation bilaterally.  HEART: Regular rate and rhythm. ABDOMEN: Soft, nontender, nondistended, gravid.  EXTREMITIES: Nontender, no edema, 2+ distal pulses. CERVICAL EXAM: Dilatation 4cm   Effacement 80%   Station out of pelvis   Presentation: transverse FHT:  Baseline rate 140 bpm   Variability moderate  Accelerations present   Decelerations none Contractions: Every 2-3 mins   Labs: No results found for this or  any previous visit (from the past 24 hour(s)). Imaging Studies:  US Ob Limited    Assessment: TANI VIRGO is  36 y.o. G3P2002 at [redacted]w[redacted]d presents with possible early labor, unstable lie.  Plan: Discussed with Dr. Marice Potter.  Due to risk of prolapsed cord if SROM, will attempt ECV  [name],Alejandrina Raimer 4/4/20139:17 PM

## 2012-04-03 NOTE — Procedures (Signed)
After a reactive NST and consenting [name], she was given terbutaline SQ. I then did an ultrasound which showed the baby to be transverse back down. I applied judicious pressure and moved the baby to the vertex lie. I checked her cervix and found her to be dilated to 6 cm. She will be admitted for labor. Her GBS is negative.

## 2012-04-03 NOTE — Telephone Encounter (Signed)
Preadmission screen  

## 2012-04-04 ENCOUNTER — Inpatient Hospital Stay (HOSPITAL_COMMUNITY): Payer: Medicaid Other | Admitting: Anesthesiology

## 2012-04-04 ENCOUNTER — Encounter (HOSPITAL_COMMUNITY): Payer: Self-pay | Admitting: Anesthesiology

## 2012-04-04 ENCOUNTER — Encounter (HOSPITAL_COMMUNITY): Payer: Self-pay | Admitting: *Deleted

## 2012-04-04 DIAGNOSIS — IMO0002 Reserved for concepts with insufficient information to code with codable children: Secondary | ICD-10-CM

## 2012-04-04 MED ORDER — DIBUCAINE 1 % RE OINT: 1.0000 "application " | TOPICAL_OINTMENT | RECTAL | Status: DC | PRN

## 2012-04-04 MED ORDER — SIMETHICONE 80 MG PO CHEW: 80.0000 mg | CHEWABLE_TABLET | ORAL | Status: DC | PRN

## 2012-04-04 MED ORDER — SENNOSIDES-DOCUSATE SODIUM 8.6-50 MG PO TABS
2.0000 | ORAL_TABLET | Freq: Every day | ORAL | Status: DC
  Administered 2012-04-04 – 2012-04-05 (×2): 2 via ORAL

## 2012-04-04 MED ORDER — LANOLIN HYDROUS EX OINT: TOPICAL_OINTMENT | CUTANEOUS | Status: DC | PRN

## 2012-04-04 MED ORDER — WITCH HAZEL-GLYCERIN EX PADS: 1.0000 "application " | MEDICATED_PAD | CUTANEOUS | Status: DC | PRN

## 2012-04-04 MED ORDER — BENZOCAINE-MENTHOL 20-0.5 % EX AERO: 1.0000 "application " | INHALATION_SPRAY | CUTANEOUS | Status: DC | PRN

## 2012-04-04 MED ORDER — OXYCODONE-ACETAMINOPHEN 5-325 MG PO TABS
1.0000 | ORAL_TABLET | ORAL | Status: DC | PRN
  Administered 2012-04-04 – 2012-04-05 (×3): 1 via ORAL
  Filled 2012-04-04 (×3): qty 1

## 2012-04-04 MED ORDER — TETANUS-DIPHTH-ACELL PERTUSSIS 5-2.5-18.5 LF-MCG/0.5 IM SUSP: 0.5000 mL | Freq: Once | INTRAMUSCULAR | Status: DC

## 2012-04-04 MED ORDER — ONDANSETRON HCL 4 MG PO TABS: 4.0000 mg | ORAL_TABLET | ORAL | Status: DC | PRN

## 2012-04-04 MED ORDER — FENTANYL CITRATE 0.05 MG/ML IJ SOLN
50.0000 ug | INTRAMUSCULAR | Status: DC | PRN
  Administered 2012-04-04 (×3): 50 ug via INTRAVENOUS
  Filled 2012-04-04 (×3): qty 2

## 2012-04-04 MED ORDER — IBUPROFEN 600 MG PO TABS: 600.0000 mg | ORAL_TABLET | Freq: Four times a day (QID) | ORAL | Status: DC

## 2012-04-04 MED ORDER — FENTANYL 2.5 MCG/ML BUPIVACAINE 1/10 % EPIDURAL INFUSION (WH - ANES)
INTRAMUSCULAR | Status: DC | PRN
  Administered 2012-04-04: 14 mL/h via EPIDURAL

## 2012-04-04 MED ORDER — LIDOCAINE HCL (PF) 1 % IJ SOLN
INTRAMUSCULAR | Status: DC | PRN
  Administered 2012-04-04 (×2): 8 mL

## 2012-04-04 MED ORDER — DIPHENHYDRAMINE HCL 25 MG PO CAPS: 25.0000 mg | ORAL_CAPSULE | Freq: Four times a day (QID) | ORAL | Status: DC | PRN

## 2012-04-04 MED ORDER — ONDANSETRON HCL 4 MG/2ML IJ SOLN: 4.0000 mg | INTRAMUSCULAR | Status: DC | PRN

## 2012-04-04 MED ORDER — ZOLPIDEM TARTRATE 5 MG PO TABS: 5.0000 mg | ORAL_TABLET | Freq: Every evening | ORAL | Status: DC | PRN

## 2012-04-04 MED ORDER — PRENATAL MULTIVITAMIN CH
1.0000 | ORAL_TABLET | Freq: Every day | ORAL | Status: DC
  Administered 2012-04-05 – 2012-04-06 (×2): 1 via ORAL
  Filled 2012-04-04 (×2): qty 1

## 2012-04-04 MED ORDER — ACETAMINOPHEN 325 MG PO TABS
650.0000 mg | ORAL_TABLET | Freq: Four times a day (QID) | ORAL | Status: DC | PRN
  Administered 2012-04-04: 650 mg via ORAL
  Filled 2012-04-04: qty 2

## 2012-04-04 NOTE — Progress Notes (Signed)
Dr. Marice Potter at beside. SVE 7/95/-1. Provider reviewed strip. Frequent contractions noted MD wants pitocin to stay at 39milli-units/min. Will continue to monitor. K. Thurnell Lose, RN 04/04/2012

## 2012-04-04 NOTE — Anesthesia Preprocedure Evaluation (Signed)
Anesthesia Evaluation  Patient identified by MRN, date of birth, ID band Patient awake    Reviewed: Allergy & Precautions, H&P , NPO status , Patient's Chart, lab work & pertinent test results  Airway Mallampati: I TM Distance: >3 FB Neck ROM: full    Dental No notable dental hx.    Pulmonary neg pulmonary ROS,  breath sounds clear to auscultation  Pulmonary exam normal       Cardiovascular negative cardio ROS      Neuro/Psych negative neurological ROS  negative psych ROS   GI/Hepatic negative GI ROS, Neg liver ROS,   Endo/Other  negative endocrine ROS  Renal/GU negative Renal ROS  negative genitourinary   Musculoskeletal negative musculoskeletal ROS (+)   Abdominal Normal abdominal exam  (+)   Peds negative pediatric ROS (+)  Hematology negative hematology ROS (+)   Anesthesia Other Findings   Reproductive/Obstetrics (+) Pregnancy                           Anesthesia Physical Anesthesia Plan  ASA: II  Anesthesia Plan: Epidural   Post-op Pain Management:    Induction:   Airway Management Planned:   Additional Equipment:   Intra-op Plan:   Post-operative Plan:   Informed Consent: I have reviewed the patients History and Physical, chart, labs and discussed the procedure including the risks, benefits and alternatives for the proposed anesthesia with the patient or authorized representative who has indicated his/her understanding and acceptance.     Plan Discussed with:   Anesthesia Plan Comments:         Anesthesia Quick Evaluation  

## 2012-04-04 NOTE — Progress Notes (Signed)
S. Comfortable with epidural  O. VSS, AF     CVX-7/90/-1     Adequate MVUs     EFW- 6 1/2 pounds     Pelvis adequate  A/P. Labor- expect SVD

## 2012-04-04 NOTE — Anesthesia Postprocedure Evaluation (Signed)
  Anesthesia Post-op Note  Patient: [name]  Procedure(s) Performed: * No procedures listed *  Patient Location: Mother/Baby  Anesthesia Type: Epidural  Level of Consciousness: awake  Airway and Oxygen Therapy: Patient Spontanous Breathing  Post-op Pain: mild  Post-op Assessment: Patient's Cardiovascular Status Stable and Respiratory Function Stable  Post-op Vital Signs: stable  Complications: No apparent anesthesia complications

## 2012-04-04 NOTE — Progress Notes (Signed)
   [name] is a 36 y.o. G3P2002 at [redacted]w[redacted]d  admitted for active labor  Subjective:  Desires IV meds Objective: BP 100/70  Pulse 94  Temp(Src) 98.1 F (36.7 C) (Oral)  Resp 20  Ht 5\' 4"  (1.626 m)  Wt 74.844 kg (165 lb)  BMI 28.32 kg/m2  LMP 06/25/2011    FHT:  FHR: 140 bpm, variability: moderate,  accelerations:  Present,  decelerations:  Absent UC:   regular, every 2 minutes SVE:   6/100/-3 AROM with clear fluid  Labs: Lab Results  Component Value Date   WBC 8.9 04/03/2012   HGB 10.5* 04/03/2012   HCT 31.5* 04/03/2012   MCV 93.2 04/03/2012   PLT 161 04/03/2012    Assessment / Plan: Spontaneous labor, progressing normally  Labor: Progressing on Pitocin, will continue to increase then AROM Fetal Wellbeing:  Category I Pain Control:  Labor support without medications Anticipated MOD:  NSVD  [name],Tal Kempker 04/04/2012, 12:27 AM

## 2012-04-04 NOTE — Anesthesia Procedure Notes (Signed)
Epidural Patient location during procedure: OB Start time: 04/04/2012 4:18 AM End time: 04/04/2012 4:22 AM Reason for block: procedure for pain  Staffing Anesthesiologist: Sandrea Hughs Performed by: anesthesiologist   Preanesthetic Checklist Completed: patient identified, site marked, surgical consent, pre-op evaluation, timeout performed, IV checked, risks and benefits discussed and monitors and equipment checked  Epidural Patient position: sitting Prep: site prepped and draped and DuraPrep Patient monitoring: continuous pulse ox and blood pressure Approach: midline Injection technique: LOR air  Needle:  Needle type: Tuohy  Needle gauge: 17 G Needle length: 9 cm Needle insertion depth: 6 cm Catheter type: closed end flexible Catheter size: 19 Gauge Catheter at skin depth: 11 cm Test dose: negative and Other  Assessment Sensory level: T8 Events: blood not aspirated, injection not painful, no injection resistance, negative IV test and no paresthesia

## 2012-04-04 NOTE — Progress Notes (Signed)
Patient ID: [name], female   DOB: 28-Mar-1976, 36 y.o.   MRN: [id number]  S. Comfortable with epidura  O. VSS, AF     FHR- 140s reassuring     CVX- 7/95/-1     Excellent MVUs  A/P. Labor-progressing, expect SVD

## 2012-04-04 NOTE — Progress Notes (Addendum)
   [name] is a 36 y.o. G3P2002 at [redacted]w[redacted]d  admitted for active labor  Subjective: Contractions are getting stronger  Objective: BP 104/71  Pulse 101  Temp(Src) 98.1 F (36.7 C) (Oral)  Resp 20  Ht 5\' 4"  (1.626 m)  Wt 74.844 kg (165 lb)  BMI 28.32 kg/m2  LMP 06/25/2011    FHT:  FHR: 130 bpm, variability: moderate,  accelerations:  Present,  decelerations:  Absent UC:   regular, every 1.5-2.5 minutes SVE:   Dilation: 6 Effacement (%): 100 Station: -3 Exam by:: H.Norton, RN   Labs: Lab Results  Component Value Date   WBC 8.9 04/03/2012   HGB 10.5* 04/03/2012   HCT 31.5* 04/03/2012   MCV 93.2 04/03/2012   PLT 161 04/03/2012  on 10 mu/min of pitocin  Assessment / Plan: Protracted latent phase' IUPC placed   Labor: progressing slowly  Will continue to increase pitocin Fetal Wellbeing:  Category I Pain Control:  Fentanyl Anticipated MOD:  NSVD  [name],[name] 04/04/2012, 3:18 AM

## 2012-04-05 ENCOUNTER — Inpatient Hospital Stay (HOSPITAL_COMMUNITY): Admission: RE | Admit: 2012-04-05 | Payer: Medicaid Other | Source: Ambulatory Visit

## 2012-04-05 NOTE — Progress Notes (Signed)
Post Partum Day 1 Subjective: no complaints, up ad lib, voiding, tolerating PO and + flatus  Objective: Blood pressure 108/77, pulse 62, temperature 97.5 F (36.4 C), temperature source Oral, resp. rate 18, height 5\' 4"  (1.626 m), weight 165 lb (74.844 kg), last menstrual period 06/25/2011, SpO2 99.00%, unknown if currently breastfeeding.  Physical Exam:  General: alert and no distress Lochia: appropriate Uterine Fundus: firm Incision: healing well at vaginal laceration, but tender DVT Evaluation: No evidence of DVT seen on physical exam.   Basename 04/03/12 2310  HGB 10.5*  HCT 31.5*    Assessment/Plan: Plan for discharge tomorrow, Breastfeeding and Circumcision prior to discharge   LOS: 2 days   [facility] 04/05/2012, 7:36 AM

## 2012-04-06 NOTE — Discharge Summary (Signed)
Obstetric Discharge Summary Reason for Admission: onset of labor and transverse fetal position Prenatal Procedures: external cephalic version Intrapartum Procedures: spontaneous vaginal delivery and repeat external verison by Dr Marice Potter upon admit Postpartum Procedures: none Complications-Operative and Postpartum: none Hemoglobin  Date Value Range Status  04/03/2012 10.5* 12.0-15.0 (g/dL) Final     HCT  Date Value Range Status  04/03/2012 31.5* 36.0-46.0 (%) Final    Physical Exam:  General: alert, cooperative and appears stated age 36: appropriate Uterine Fundus: firm Incision:  DVT Evaluation: No evidence of DVT seen on physical exam.  Discharge Diagnoses: Term Pregnancy-delivered and fetal malpresentation , corrected,  s/p gastric bypass  Discharge Information: Date: 04/06/2012 Activity: pelvic rest Diet: bariatric diet Medications: PNV and Ibuprofen Condition: stable Instructions: refer to practice specific booklet Discharge to: home Follow-up Information    Follow up with [name], MD in 6 weeks. (or earlier as needed)    Contact information:   [facility] [address] [place] [address] [phone]          Newborn Data: Live born female  Birth Weight: 6 lb 11 oz (3033 g) APGAR: 9, 9  Home with mother.  Nyimah Shadduck V 04/06/2012, 8:55 AM

## 2012-04-06 NOTE — Progress Notes (Signed)
Post Partum Day 2  Subjective: no complaints, up ad lib, voiding and tolerating PO desires to go home today. Baby hasn't had bm, so peds is evaluating this issue.  Objective: Blood pressure 89/54, pulse 82, temperature 97.9 F (36.6 C), temperature source Oral, resp. rate 18, height 5\' 4"  (1.626 m), weight 74.844 kg (165 lb), last menstrual period 06/25/2011, SpO2 99.00%, unknown if currently breastfeeding.  Physical Exam:  General: alert, cooperative and no distress Lochia: appropriate Uterine Fundus: firm Incision:  DVT Evaluation: No evidence of DVT seen on physical exam.   Basename 04/03/12 2310  HGB 10.5*  HCT 31.5*    Assessment/Plan: Discharge home  Circumcision  Later this week as outpatient.    LOS: 3 days   Ranny Wiebelhaus V 04/06/2012, 8:46 AM

## 2012-04-07 NOTE — Progress Notes (Signed)
Post discharge chart review completed.  

## 2012-07-01 ENCOUNTER — Other Ambulatory Visit (HOSPITAL_COMMUNITY)
Admission: RE | Admit: 2012-07-01 | Discharge: 2012-07-01 | Disposition: A | Discharge status: Home / Self Care | Payer: Medicaid Other | Source: Ambulatory Visit | Attending: Unknown Physician Specialty | Admitting: Unknown Physician Specialty

## 2012-07-01 DIAGNOSIS — R87611 Atypical squamous cells cannot exclude high grade squamous intraepithelial lesion on cytologic smear of cervix (ASC-H): Secondary | ICD-10-CM | POA: Insufficient documentation

## 2012-07-01 DIAGNOSIS — N72 Inflammatory disease of cervix uteri: Secondary | ICD-10-CM | POA: Insufficient documentation

## 2012-07-30 ENCOUNTER — Ambulatory Visit (HOSPITAL_COMMUNITY)
Admission: RE | Admit: 2012-07-30 | Discharge: 2012-07-30 | Disposition: A | Discharge status: Home / Self Care | Payer: Medicaid Other | Source: Ambulatory Visit | Attending: Nurse Practitioner | Admitting: Nurse Practitioner

## 2012-07-30 ENCOUNTER — Other Ambulatory Visit (HOSPITAL_COMMUNITY): Payer: Self-pay | Admitting: Nurse Practitioner

## 2012-07-30 DIAGNOSIS — M25519 Pain in unspecified shoulder: Secondary | ICD-10-CM | POA: Insufficient documentation

## 2012-08-02 ENCOUNTER — Emergency Department (HOSPITAL_COMMUNITY)
Admission: EM | Admit: 2012-08-02 | Discharge: 2012-08-02 | Disposition: A | Discharge status: Home / Self Care | Payer: Medicaid Other | Source: Home / Self Care | Attending: Emergency Medicine | Admitting: Emergency Medicine

## 2012-08-02 ENCOUNTER — Encounter (HOSPITAL_COMMUNITY): Payer: Self-pay | Admitting: *Deleted

## 2012-08-02 DIAGNOSIS — Z9884 Bariatric surgery status: Secondary | ICD-10-CM | POA: Insufficient documentation

## 2012-08-02 DIAGNOSIS — R7309 Other abnormal glucose: Secondary | ICD-10-CM | POA: Insufficient documentation

## 2012-08-02 DIAGNOSIS — M25512 Pain in left shoulder: Secondary | ICD-10-CM

## 2012-08-02 DIAGNOSIS — E669 Obesity, unspecified: Secondary | ICD-10-CM | POA: Insufficient documentation

## 2012-08-02 DIAGNOSIS — M25519 Pain in unspecified shoulder: Secondary | ICD-10-CM | POA: Insufficient documentation

## 2012-08-02 MED ORDER — DEXAMETHASONE SODIUM PHOSPHATE 10 MG/ML IJ SOLN
10.0000 mg | Freq: Once | INTRAMUSCULAR | Status: AC
  Administered 2012-08-02: 10 mg via INTRAMUSCULAR
  Filled 2012-08-02: qty 1

## 2012-08-02 NOTE — ED Notes (Signed)
Pt states left shoulder pain x 2 weeks. Has been seen by PMD and had xrays obtained but does not know the results. Pt states it feels like a pinched nerve and she is having trouble lifting with that arm.

## 2012-08-02 NOTE — ED Notes (Signed)
Pt c/o left shoulder pain x 2 weeks, denies any injury, states that she woke up one morning with pain, [facility] and had [facility] done recently

## 2012-08-02 NOTE — ED Provider Notes (Signed)
Medical screening examination/treatment/procedure(s) were performed by non-physician practitioner and as supervising physician I was immediately available for consultation/collaboration.  Juliet Rude. Rubin Payor, MD 08/02/12 [phone]

## 2012-08-02 NOTE — ED Notes (Signed)
Pt reporting elevated blood sugar.  States results 243. Reports that she received a steroid shot earlier for shoulder pain.

## 2012-08-02 NOTE — ED Provider Notes (Signed)
History     CSN: [id number]  Arrival date & time 08/02/12  1514   First MD Initiated Contact with Patient 08/02/12 1522      Chief Complaint  Patient presents with  . Shoulder Pain    (Consider location/radiation/quality/duration/timing/severity/associated sxs/prior treatment) HPI Comments: [name] presents with a 2 week history of left posterior shoulder pain which is worsen with range of motion,  Particularly with attempts to raise her left arm above shoulder level. Her pain is also worse when she first wakes in the morning, feeling stiff,  which improves after movement.  She is normally a stomach and back "sleeper" but has been sleeping on one side or the other since being pregnant.   She was seen by her pcp and had xrays obtained several days ago,  But she does not know the results of these films.  She denies injury including neck injury or pain.  She states it feels like a "pinched nerve".  She denies radiation of pain and weakness in her left arm or hand.  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  She is using tylenol but cannot take nsaids secondary to gastric bypass surgery and is breast feeding,  Therefore is limited to medications she can safely take.  She denies shortness of breath,  Cough,  Chest pain and has been afebrile.  The history is provided by the patient.    Past Medical History  Diagnosis Date  . Obesity     gastric Bypass, Roux-en Y    Past Surgical History  Procedure Date  . Gastric bypass 04/20/2011    [place], [place] 271lb preop  . Fertility surgery 2009    No abnormalities in female    Family History  Problem Relation Age of Onset  . Diabetes Father   . Hyperlipidemia Father   . Hypertension Father   . Cancer Father     skin cancer  . Depression Mother     lifelong  . Anesthesia problems Neg Hx     History  Substance Use Topics  . Smoking status: Never Smoker   . Smokeless tobacco: Never Used  . Alcohol Use: No      OB History    Grav Para Term Preterm Abortions TAB SAB Ect Mult Living   3 3 3  0 0 0 0 0 0 3      Review of Systems  Constitutional: Negative for fever.  HENT: Negative for congestion, sore throat and neck pain.   Respiratory: Negative for chest tightness and shortness of breath.   Cardiovascular: Negative for chest pain.  Gastrointestinal: Negative for nausea.  Musculoskeletal: Positive for arthralgias. Negative for joint swelling.  Skin: Negative.  Negative for rash.  Neurological: Negative for weakness and numbness.  Hematological: Negative.     Allergies  Morphine and related; Nsaids; and Penicillins  Home Medications   Current Outpatient Rx  Name Route Sig Dispense Refill  . VITAMIN D 1000 UNITS PO TABS Oral Take 1,000 Units by mouth daily.      Marland Kitchen VITAMIN B-12 IJ Injection Inject 1 each as directed every 30 (thirty) days. Vitamin B-12 1063mcg/ml Injection    . PRENATAL MULTIVITAMIN CH Oral Take 1 tablet by mouth daily.      BP 108/70  Pulse 66  Temp 97.8 F (36.6 C) (Oral)  Resp 18  SpO2 100%  LMP 06/01/2011  Breastfeeding? Yes  Physical Exam  Constitutional: She appears well-developed and well-nourished.  HENT:  Head: Atraumatic.  Neck: Normal range of motion.  Cardiovascular:       Pulses equal bilaterally  Musculoskeletal:       Left shoulder: She exhibits tenderness. She exhibits no swelling, no effusion and no crepitus.       Pt has point ttp left posterior shoulder and posterior lateral scapular area along left lateral upper back.  Pain with active ROM,  No pain with passive ROM.  Neurological: She is alert. She has normal strength. She displays normal reflexes. No sensory deficit. Coordination normal.       Equal grip strength  Skin: Skin is warm and dry.  Psychiatric: She has a normal mood and affect.    ED Course  Procedures (including critical care time)  Labs Reviewed - No data to display No results found.   1. Shoulder pain, left        MDM  Pt encouraged to continue tylenol,  Heat therapy 20 minutes qid.  Decadron injection 10 mg IM given.  Referral to ortho and/or f/u with pcp.        Burgess Amor, [place] 08/02/12 [phone]

## 2012-08-03 ENCOUNTER — Emergency Department (HOSPITAL_COMMUNITY)
Admission: EM | Admit: 2012-08-03 | Discharge: 2012-08-03 | Discharge status: Against Medical Advice | Payer: Medicaid Other | Attending: Emergency Medicine | Admitting: Emergency Medicine

## 2012-08-03 NOTE — ED Notes (Signed)
Pt requesting to have blood sugar checked to make sure it has decreased since she doesn't want to wait to be seen. Pt reports results lower than previously and wishes to leave without further evaluation or treatment.  Discussed ramifications of leaving AMA, pt verbalized understanding.

## 2012-08-07 ENCOUNTER — Ambulatory Visit (HOSPITAL_COMMUNITY)
Admission: RE | Admit: 2012-08-07 | Discharge: 2012-08-07 | Disposition: A | Discharge status: Home / Self Care | Payer: Medicaid Other | Source: Ambulatory Visit | Attending: Obstetrics & Gynecology | Admitting: Obstetrics & Gynecology

## 2012-08-07 NOTE — Progress Notes (Addendum)
Adult Lactation Consultation Outpatient Visit Note  Patient Name: [name]                              Baby Boy Sterling, DOB [birth year] Birth Weight 6 lb 11 oz., approx 4 months old today Date of Birth: 02/26/1976 Gestational Age at Delivery: Unknown Type of Delivery: SVB  Breastfeeding History: Frequency of Breastfeeding: 12 times in 24 hours Length of Feeding: 10 min first breast, 10 min second breast, then 10 min back at first breast, at night nurses longer 1 breast at a feeding approx 30 minutes. Voids: 9-10/day Stools: 3-4 day/ green mucousy, yellow after supplement with EBM at night  Supplementing / Method: Pumping:  Type of Pump: DEBP   Frequency:  5 times/day for 15-30 min  Volume:  1 to 1 1/2 oz    Mom is supplementing 1 time per day at night with 2-3 oz of EBM   Comments: Mom is here for feeding assessment. She has history of Low milk supply in the early postpartum period. Mom reports she stayed dehydrated and felt this affected her milk supply. She has been taking Fenugreek 610 mg., 4 tabs in AM, 3 tabs in the afternoon, 3 tabs in the evening, 4 tabs before bed along with Mother Milk Plus 1 ml 4 time/day. 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 She reports the baby was gaining 5-7 oz per week from weight checks at support group, then 2-3 weeks ago the baby weight gain dropped to 2 1/2 oz/week, she starting pumping and supplementing at night and the weight gain increased the past week to 5 oz again. Mom reports her nursing pattern is to nurse on the 1st breast for 10 minutes, switch to 2nd breast for 10 minutes, then back to 1 breast for 10 minutes. She started this routine because the baby would fall asleep at the breast.    Consultation Evaluation: At this visit, baby is very figditty at the breast, pulling back getting on the end of the nipple. Mom is not very aggressive with assisting baby to latch. She holds baby in cradle hold  and will massage her breast to keep baby nursing, but he wants to pull back or come off the breast. Assisted mom to obtain more depth with the latch and keep the baby close. This seemed to help him stay engaged at the breast. Baby Sterling nursed on the left breast for 15 minutes transferring 28 ml. Latched to right breast, same experience, he would stay more engaged when he obtained more depth. After nursing for 20 minutes, he transferred 32 ml. Went back to the right breast and nursed another 10 minutes. Some swallows audible, mom reports 2-3 let downs on both breast.   Initial Feeding Assessment: Pre-feed Weight:  12 lb 5.6 oz/ 5602 gm Post-feed Weight:  12 lb. 6.6 oz/5630 gm Amount Transferred: 28 ml Comments:  From left breast with nursing 15 minutes.  Additional Feeding Assessment: Pre-feed Weight:   12 lb. 6.6 oz/5630 gm Post-feed Weight:   12 lb. 7.7 oz/5662 gm Amount Transferred: 32 ml Comments:  From right breast with nursing 20 minutes  Additional Feeding Assessment: Pre-feed Weight:  12 lb. 7.7 oz/5662 gm Post-feed Weight:  12 lb. 8.3 oz/5678 gm Amount Transferred:  16 ml. Comments:  From right breast nursing an additional 10 minutes.   Total Breast milk Transferred this Visit: 76 ml. Total  Supplement Given:   Additional Interventions: Advised mom that at 62 months of age, [employer] should get at least 3-4 oz with each feeding. Encouraged mom to obtain more depth and use better support with Baby Sterling to keep him nursing longer and transferring more milk. Advised to keep him active at the 1st breast 20-30 minutes, then change to 2nd breast for 10+ minutes in an effort to empty her breast better to increase her milk supplyand have Baby Sterling get more hind milk with greater fat content to support his weight. Advised mom to post pump after feedings for 15 minutes. Give baby Sterling back what EBM she pumps but at least 1 to 1 1/2 oz after each feeding till we know he is  transferring more milk.  If [employer] will not stay awake at the breast for very long advised to prepump off fore milk so he will obtain the hind milk with the fat he needs for growth.   Follow-Up Lactation OP appt. Thursday, 08/14/12 at 2:30. Mom plans to come to support group on Tuesday, 08/12/12.     Alfred Levins 08/07/2012, 5:20 PM

## 2012-08-10 ENCOUNTER — Emergency Department (HOSPITAL_COMMUNITY)
Admission: EM | Admit: 2012-08-10 | Discharge: 2012-08-10 | Disposition: A | Discharge status: Home / Self Care | Payer: Medicaid Other | Attending: Emergency Medicine | Admitting: Emergency Medicine

## 2012-08-10 ENCOUNTER — Encounter (HOSPITAL_COMMUNITY): Payer: Self-pay | Admitting: *Deleted

## 2012-08-10 DIAGNOSIS — M25519 Pain in unspecified shoulder: Secondary | ICD-10-CM | POA: Insufficient documentation

## 2012-08-10 DIAGNOSIS — Z880 Allergy status to penicillin: Secondary | ICD-10-CM | POA: Insufficient documentation

## 2012-08-10 DIAGNOSIS — Z9884 Bariatric surgery status: Secondary | ICD-10-CM | POA: Insufficient documentation

## 2012-08-10 MED ORDER — KETOROLAC TROMETHAMINE 30 MG/ML IJ SOLN
30.0000 mg | Freq: Once | INTRAMUSCULAR | Status: AC
  Administered 2012-08-10: 30 mg via INTRAMUSCULAR
  Filled 2012-08-10: qty 1

## 2012-08-10 NOTE — ED Provider Notes (Signed)
History  Scribed for Gerhard Munch, MD, the patient was seen in room TR07C/TR07C. This chart was scribed by Candelaria Stagers. The patient's care started at 3:45 PM   CSN: [id number]  Arrival date & time 08/10/12  1452   First MD Initiated Contact with Patient 08/10/12 1527      Chief Complaint  Patient presents with  . left arm pain      The history is provided by the patient.   [name] is a 36 y.o. female who presents to the Emergency Department complaining of left arm pain that started at her shoulder about three weeks ago and has now moved down the arm to her fingers.  She reports that the middle finger of the left arm has now started to "jump".  She was seen at [facility] ED one week ago and was given a steroid shot.  Pt denies injury.  She has used tylenol and heat with no relief.  Pt is currently breastfeeding.  She has h/o gastric bypass surgery.      Past Medical History  Diagnosis Date  . Obesity     gastric Bypass, Roux-en Y    Past Surgical History  Procedure Date  . Gastric bypass 04/20/2011    [place], wt 271lb preop  . Fertility surgery 2009    No abnormalities in female    Family History  Problem Relation Age of Onset  . Diabetes Father   . Hyperlipidemia Father   . Hypertension Father   . Cancer Father     skin cancer  . Depression Mother     lifelong  . Anesthesia problems Neg Hx     History  Substance Use Topics  . Smoking status: Never Smoker   . Smokeless tobacco: Never Used  . Alcohol Use: No    OB History    Grav Para Term Preterm Abortions TAB SAB Ect Mult Living   3 3 3  0 0 0 0 0 0 3      Review of Systems  Constitutional:       Per HPI, otherwise negative  HENT:       Per HPI, otherwise negative  Eyes: Negative.   Respiratory:       Per HPI, otherwise negative  Cardiovascular:       Per HPI, otherwise negative  Gastrointestinal: Negative for vomiting.  Genitourinary: Negative.   Musculoskeletal: Positive  for arthralgias (left shoulder and arm pain).       Per HPI, otherwise negative  Skin: Negative.   Neurological: Negative for syncope.    Allergies  Morphine and related; Nsaids; and Penicillins  Home Medications   Current Outpatient Rx  Name Route Sig Dispense Refill  . VITAMIN B-12 IJ Injection Inject 1 each as directed every 30 (thirty) days. Vitamin B-12 1068mcg/ml Injection    . FENUGREEK PO Oral Take 3-4 capsules by mouth 4 (four) times daily. Take 4 capsules in the morning and at night, and take 3 capsules in the afternoon and evening (to equal four times a day)    . PRENATAL MULTIVITAMIN CH Oral Take 1 tablet by mouth daily.      BP 112/72  Pulse 78  Temp 98 F (36.7 C) (Oral)  Resp 20  SpO2 98%  LMP 06/01/2011  Breastfeeding? Yes  Physical Exam  Nursing note and vitals reviewed. Constitutional: She is oriented to person, place, and time. She appears well-developed and well-nourished. No distress.  HENT:  Head: Normocephalic and atraumatic.  Eyes: Conjunctivae and EOM are normal.  Cardiovascular: Normal rate and regular rhythm.   Pulmonary/Chest: Effort normal and breath sounds normal. No stridor. No respiratory distress.  Abdominal: She exhibits no distension.  Musculoskeletal:       Tenderness of the trapezius.  Upper extremity strength good symmetrically.  Neurovascularly intact, good sensation.    Neurological: She is alert and oriented to person, place, and time. No cranial nerve deficit.  Skin: Skin is warm and dry.  Psychiatric: She has a normal mood and affect.    ED Course  Procedures   DIAGNOSTIC STUDIES: Oxygen Saturation is 98% on room air, normal by my interpretation.    COORDINATION OF CARE:     Labs Reviewed - No data to display No results found.   No diagnosis found.    MDM  I personally performed the services described in this documentation, which was scribed in my presence. The recorded information has been reviewed and  considered.  This given the patient's he risk factors there is low suspicion for acute cardiac disease.  Similarly, the patient has no respiratory complaints, and aside from recent pregnancy the risk factors for PE.  The patient's pain with palpation about the superior shoulder, her description of the persistency of discomfort is suggestive of a musculoskeletal etiology.  We discussed methods for analgesia, therapy, and the patient was discharged in stable condition to follow up with her primary care physician.    Gerhard Munch, MD 08/11/12 1420

## 2012-08-10 NOTE — ED Notes (Signed)
Pt states she has had knot to left posterior shoulder and now radiating down left arm making middle finger jump.  Pt states this has been going on for 3 weeks

## 2012-08-10 NOTE — ED Notes (Signed)
Pt. Stated, It started 3 weeks ago, pain in my lt. Shoulder and moving down my lt. Arm.  I went to [facility] 2 weeks ago, i had xray and normal and was given a steroid shot and no better

## 2012-08-20 ENCOUNTER — Other Ambulatory Visit (HOSPITAL_COMMUNITY): Payer: Self-pay | Admitting: Preventative Medicine

## 2012-08-20 DIAGNOSIS — M25512 Pain in left shoulder: Secondary | ICD-10-CM

## 2012-08-22 ENCOUNTER — Ambulatory Visit (HOSPITAL_COMMUNITY): Payer: Medicaid Other

## 2012-08-28 ENCOUNTER — Encounter: Payer: Self-pay | Admitting: Orthopedic Surgery

## 2012-08-28 ENCOUNTER — Emergency Department (HOSPITAL_COMMUNITY)
Admission: EM | Admit: 2012-08-28 | Discharge: 2012-08-29 | Disposition: A | Discharge status: Home / Self Care | Payer: Medicaid Other | Attending: Emergency Medicine | Admitting: Emergency Medicine

## 2012-08-28 ENCOUNTER — Encounter (HOSPITAL_COMMUNITY): Payer: Self-pay

## 2012-08-28 ENCOUNTER — Ambulatory Visit (INDEPENDENT_AMBULATORY_CARE_PROVIDER_SITE_OTHER): Payer: Medicaid Other | Admitting: Orthopedic Surgery

## 2012-08-28 ENCOUNTER — Telehealth: Payer: Self-pay | Admitting: *Deleted

## 2012-08-28 VITALS — BP 120/60 | Ht 65.0 in | Wt 140.0 lb

## 2012-08-28 DIAGNOSIS — R109 Unspecified abdominal pain: Secondary | ICD-10-CM | POA: Insufficient documentation

## 2012-08-28 DIAGNOSIS — E669 Obesity, unspecified: Secondary | ICD-10-CM | POA: Insufficient documentation

## 2012-08-28 DIAGNOSIS — M7500 Adhesive capsulitis of unspecified shoulder: Secondary | ICD-10-CM | POA: Insufficient documentation

## 2012-08-28 DIAGNOSIS — Z9884 Bariatric surgery status: Secondary | ICD-10-CM | POA: Insufficient documentation

## 2012-08-28 HISTORY — DX: Adhesive capsulitis of unspecified shoulder: M75.00

## 2012-08-28 NOTE — Progress Notes (Signed)
Patient ID: [name], female   DOB: [birth year], 36 y.o.   MRN: [id number] Chief Complaint  Patient presents with  . Shoulder Pain    left shoulder pain that radiates to fingers x 1 month, gradual onset, no known injury    BP 120/60  Ht 5\' 5"  (1.651 m)  Wt 140 lb (63.504 kg)  BMI 23.30 kg/m2  LMP 06/01/2011  This is a very interesting patient comes to [place] status post gastric bypass surgery in [place] with a chief complaint of pain in her left shoulder radiating to the long finger of her left hand associated with pain in the biceps area of the right arm. She denies neck pain or neck stiffness.  Her symptoms started one month ago they came on gradually. She is received one shot in her shoulder at urgent care and one in her hip she went to the ER she's had Tylenol and Toradol she cannot tolerate anti-inflammatories secondary to the gastric bypass and she cannot take any stronger pain medication secondary to the fact that she is breast-feeding. She is having quite severe 9/10 constant sharp throbbing stabbing pain which is a lot worse at night. She can take a hot shower and get some relief. Her pain is worse when she is lifting her extending to reach.  She reports locking and catching in the numbness and tingling.  No other treatment has been tried up to this point  She has fatigue occasional nausea flank pain unsteady gait. Musculoskeletal symptoms are recorded above. The other systems are reviewed and are normal.  She is allergic to morphine, penicillin and cannot take anti-inflammatories.  Past Medical History  Diagnosis Date  . Obesity     gastric Bypass, Roux-en Y    Past Surgical History  Procedure Date  . Gastric bypass 04/20/2011    [place], wt 271lb preop  . Fertility surgery 2009    No abnormalities in female    BP 120/60  Ht 5\' 5"  (1.651 m)  Wt 140 lb (63.504 kg)  BMI 23.30 kg/m2  LMP 06/01/2011  Physical Exam  Nursing note and vitals  reviewed. Constitutional: She is oriented to person, place, and time. She appears well-developed and well-nourished.  HENT:  Head: Normocephalic.  Neck: Normal range of motion and full passive range of motion without pain. Neck supple. No JVD present. No spinous process tenderness and no muscular tenderness present. No tracheal deviation, no edema, no erythema and normal range of motion present.  Cardiovascular: Intact distal pulses.   Pulmonary/Chest: No stridor.  Musculoskeletal:       Left shoulder: She exhibits decreased range of motion, tenderness, bony tenderness and pain. She exhibits no swelling, no effusion, no crepitus, no deformity, no laceration, no spasm, normal pulse and normal strength.       She exhibits signs of adhesive capsulitis with only 30 of external rotation with her arm at her side does not improve actively and is less than the opposite normal side which has 50-60 of external rotation. She has decreased abduction as well as forward elevation. She cannot internally rotate past her sacrum.  The weakness we feel is coming from her pain and stiffness.  The shoulder is stable as tested although testing incomplete because of the capsulitis.  Lower extremities show no evidence of contracture subluxation atrophy tremor or deformity  Lymphadenopathy:    She has no cervical adenopathy.  Neurological: She is alert and oriented to person, place, and time. She has normal reflexes.  Skin: Skin is warm and dry.  Psychiatric: She has a normal mood and affect. Her behavior is normal. Judgment and thought content normal.   X-rays are negative  Diagnosis adhesive capsulitis  I discussed with her pain management options and asked her to call her physician regarding the breast-feeding because I think she will need pain medication to undergo the therapy. I did give her a subacromial injection as well.  Subacromial Shoulder Injection Procedure Note  Pre-operative Diagnosis: left RC  Syndrome  Post-operative Diagnosis: same  Indications: pain   Anesthesia: ethyl chloride   Procedure Details   Verbal consent was obtained for the procedure. The shoulder was prepped withalcohol and the skin was anesthetized. A 20 gauge needle was advanced into the subacromial space through posterior approach without difficulty  The space was then injected with 3 ml 1% lidocaine and 1 ml of depomedrol. The injection site was cleansed with isopropyl alcohol and a dressing was applied.  Complications:  None; patient tolerated the procedure well.

## 2012-08-28 NOTE — ED Notes (Signed)
Pt c/o abdominal pain and nausea that started 7 days prior but has gotten worse. Pt states she had gastric bypass a year ago and feels food going into pouch.

## 2012-08-28 NOTE — Patient Instructions (Addendum)
You have received a steroid shot. 15% of patients experience increased pain at the injection site with in the next 24 hours. This is best treated with ice and tylenol extra strength 2 tabs every 8 hours. If you are still having pain please call the office.   Schedule therapy at [facility]   Over the counter biofreeze apply 3 x a day      Adhesive Capsulitis Sometimes the shoulder becomes stiff and is painful to move. Some people say it feels as if the shoulder is frozen in place. Because of this, the condition is called "frozen shoulder." Its medical name is adhesive capsulitis.   The shoulder joint is made up of strong connective tissue that attaches the ball of the humerus to the shallow shoulder socket. This strong connective tissue is called the joint capsule. This tissue can become stiff and swollen. That is when adhesive capsulitis sets in. CAUSES   It is not always clear just what the cause adhesive capsulitis. Possibilities include:  Injury to the shoulder joint.   Strain. This is a repetitive injury brought about by overuse.   Lack of use. Perhaps your arm or hand was otherwise injured. It might have been in a sling for awhile. Or perhaps you were not using it to avoid pain.   Referred pain. This is a sort of trick the body plays. You feel pain in the shoulder. But, the pain actually comes from an injury somewhere else in the body.   Long-standing health problems. Several diseases can cause adhesive capsulitis. They include diabetes, heart disease, stroke, thyroid problems, rheumatoid arthritis and lung disease.   Being a women older than 40. Anyone can develop adhesive capsulitis but it is most common in women in this age group.  SYMPTOMS    Pain.   It occurs when the arm is moved.   Parts of the shoulder might hurt if they are touched.   Pain is worse at night or when resting.   Soreness. It might not be strong enough to be called pain. But, the shoulder aches.    The shoulder does not move freely.   Muscle spasms.   Trouble sleeping because of shoulder ache or pain.  DIAGNOSIS   To decide if you have adhesive capsulitis, your healthcare provider will probably:  Ask about symptoms you have noticed.   Ask about your history of joint pain and anything that might have caused the pain.   Ask about your overall health.   Use hands to feel your shoulder and neck.   Ask you to move your shoulder in specific directions. This may indicate the origin of the pain.   Order imaging tests; pictures of the shoulder. They help pinpoint the source of the problem. An X-ray might be used. For more detail, an MRI is often used. An MRI details the tendons, muscles and ligaments as well as the joint.  TREATMENT   Adhesive capsulitis can be treated several ways. Most treatments can be done in a clinic or in your healthcare provider's office. Be sure to discuss the different options with your caregiver. They include:  Physical therapy. You will work on specific exercises to get your shoulder moving again. The exercises usually involve stretching. A physical therapist (a caregiver with special training) can show you what to do and what not to do. The exercises will need to be done daily.   Medication.   Over-the-counter medicines may relieve pain and inflammation (the body's way of  reacting to injury or infection).   Corticosteroids. These are stronger drugs to reduce pain and inflammation. They are given by injection (shots) into the shoulder joint. Frequent treatment is not recommended.   Muscle relaxants. Medication may be prescribed to ease muscle spasms.   Treatment of underlying conditions. This means treating another condition that is causing your shoulder problem. This might be a rotator cuff (tendon) problem   Shoulder manipulation. The shoulder will be moved by your healthcare provider. You would be under general anesthesia (given a drug that puts you  to sleep). You would not feel anything. Sometimes the joint will be injected with salt water (saline) at high pressure to break down internal scarring in the joint capsule.   Surgery. This is rarely needed. It may be suggested in advanced cases after all other treatment has failed.  PROGNOSIS   In time, most people recover from adhesive capsulitis. Sometimes, however, the pain goes away but full movement of the shoulder does not return.   HOME CARE INSTRUCTIONS    Take any pain medications recommended by your healthcare provider. Follow the directions carefully.   If you have physical therapy, follow through with the therapist's suggestions. Be sure you understand the exercises you will be doing. You should understand:   How often the exercises should be done.   How many times each exercise should be repeated.   How long they should be done.   What other activities you should do, or not do.   That you should warm up before doing any exercise. Just 5 to 10 minutes will help. Small, gentle movements should get your shoulder ready for more.   Avoid high-demand exercise that involves your shoulder such as throwing. This type of exercise can make pain worse.   Consider using cold packs. Cold may ease swelling and pain. Ask your healthcare provider if a cold pack might help you. If so, get directions on how and when to use them.  SEEK MEDICAL CARE IF:    You have any questions about your medications.   Your pain continues to increase.  Document Released: 10/14/2009 Document Revised: 12/06/2011 Document Reviewed: 10/14/2009 [facility] Patient Information 2012 [place], [place].

## 2012-08-29 MED ORDER — ALUM & MAG HYDROXIDE-SIMETH 200-200-20 MG/5ML PO SUSP
15.0000 mL | Freq: Once | ORAL | Status: AC
  Administered 2012-08-29: 15 mL via ORAL
  Filled 2012-08-29: qty 30

## 2012-08-29 MED ORDER — ONDANSETRON HCL 4 MG/2ML IJ SOLN
4.0000 mg | Freq: Once | INTRAMUSCULAR | Status: AC
  Administered 2012-08-29: 4 mg via INTRAVENOUS
  Filled 2012-08-29: qty 2

## 2012-08-29 MED ORDER — PANTOPRAZOLE SODIUM 20 MG PO TBEC: 40.0000 mg | DELAYED_RELEASE_TABLET | Freq: Every day | ORAL | Status: DC

## 2012-08-29 MED ORDER — PANTOPRAZOLE SODIUM 40 MG IV SOLR
40.0000 mg | Freq: Once | INTRAVENOUS | Status: AC
  Administered 2012-08-29: 40 mg via INTRAVENOUS
  Filled 2012-08-29: qty 40

## 2012-08-29 NOTE — ED Provider Notes (Signed)
History     CSN: [id number]  Arrival date & time 08/28/12  2325   First MD Initiated Contact with Patient 08/29/12 0002      Chief Complaint  Patient presents with  . Abdominal Pain    (Consider location/radiation/quality/duration/timing/severity/associated sxs/prior treatment) HPI  [name] is a 36 y.o. female with a h/o gastric bypass surgery who presents to the Emergency Department complaining of nausea x 1 week with epigastric pain. Pain is burning discomfort associated with nausea and one episode of vomiting. Currently is not on PPI, H2 blocker. She is taking reglan to help with lactation.   PCP Dr. Freida Busman  Past Medical History  Diagnosis Date  . Obesity     gastric Bypass, Roux-en Y    Past Surgical History  Procedure Date  . Gastric bypass 04/20/2011    [place] 271lb preop  . Fertility surgery 2009    No abnormalities in female    Family History  Problem Relation Age of Onset  . Diabetes Father   . Hyperlipidemia Father   . Hypertension Father   . Cancer Father     skin cancer  . Depression Mother     lifelong  . Anesthesia problems Neg Hx   . Arthritis      History  Substance Use Topics  . Smoking status: Never Smoker   . Smokeless tobacco: Never Used  . Alcohol Use: No    OB History    Grav Para Term Preterm Abortions TAB SAB Ect Mult Living   3 3 3  0 0 0 0 0 0 3      Review of Systems  Constitutional: Negative for fever.       10 Systems reviewed and are negative for acute change except as noted in the HPI.  HENT: Negative for congestion.   Eyes: Negative for discharge and redness.  Respiratory: Negative for cough and shortness of breath.   Cardiovascular: Negative for chest pain.  Gastrointestinal: Positive for nausea, vomiting and abdominal pain.  Musculoskeletal: Negative for back pain.  Skin: Negative for rash.  Neurological: Negative for syncope, numbness and headaches.  Psychiatric/Behavioral:       No  behavior change.    Allergies  Morphine and related; Nsaids; and Penicillins  Home Medications   Current Outpatient Rx  Name Route Sig Dispense Refill  . VITAMIN B-12 IJ Injection Inject 1 each as directed every 30 (thirty) days. Vitamin B-12 1032mcg/ml Injection    . FENUGREEK PO Oral Take 3-4 capsules by mouth 4 (four) times daily. Take 4 capsules in the morning and at night, and take 3 capsules in the afternoon and evening (to equal four times a day)    . METOCLOPRAMIDE HCL 10 MG PO TABS Oral Take 10 mg by mouth 3 (three) times daily.    Marland Kitchen PRENATAL MULTIVITAMIN CH Oral Take 1 tablet by mouth daily.      BP 106/72  Pulse 64  Temp 97.7 F (36.5 C) (Oral)  Resp 18  Ht 5' 5.5" (1.664 m)  Wt 135 lb (61.236 kg)  BMI 22.12 kg/m2  SpO2 97%  LMP 06/01/2011  Physical Exam  Nursing note and vitals reviewed. Constitutional:       Awake, alert, nontoxic appearance.  HENT:  Head: Atraumatic.  Eyes: Right eye exhibits no discharge. Left eye exhibits no discharge.  Neck: Neck supple.  Pulmonary/Chest: Effort normal. She exhibits no tenderness.  Abdominal: Soft. There is no tenderness. There is no rebound.  Musculoskeletal:  She exhibits no tenderness.       Baseline ROM, no obvious new focal weakness.  Neurological:       Mental status and motor strength appears baseline for patient and situation.  Skin: No rash noted.  Psychiatric: She has a normal mood and affect.    ED Course  Procedures (including critical care time)  Labs Reviewed - No data to display No results found.   No diagnosis found.    MDM  Patient with h/o gastric bypass here with abdominal pain and burning. Began PPI and mylanta. Referral to GI.Pt stable in ED with no significant deterioration in condition.The patient appears reasonably screened and/or stabilized for discharge and I doubt any other medical condition or other [facility] requiring further screening, evaluation, or treatment in the ED at this time  prior to discharge.  MDM Reviewed: nursing note and vitals           Nicoletta Dress. Colon Branch, MD 08/29/12 9147

## 2012-09-02 ENCOUNTER — Telehealth: Payer: Self-pay | Admitting: Orthopedic Surgery

## 2012-09-02 ENCOUNTER — Other Ambulatory Visit: Payer: Self-pay | Admitting: *Deleted

## 2012-09-02 MED ORDER — HYDROCODONE-ACETAMINOPHEN 5-325 MG PO TABS: 1.0000 | ORAL_TABLET | ORAL | Status: AC | PRN

## 2012-09-02 NOTE — Telephone Encounter (Signed)
[name], please call [name] at [phone].  Question about medicine

## 2012-09-02 NOTE — Telephone Encounter (Signed)
Med sent as directed by dr Romeo Apple, called patient, left voicemail

## 2012-09-02 NOTE — Telephone Encounter (Signed)
Med sent, left voicemail for patient

## 2012-09-02 NOTE — Telephone Encounter (Signed)
MESSAGE HAS ALREADY BEEN SENT TO YOU

## 2012-09-02 NOTE — Telephone Encounter (Signed)
Find out what the question is. 

## 2012-09-02 NOTE — Telephone Encounter (Signed)
Hasnt heard back about pain med, physical therapy starts tomorrow

## 2012-09-03 ENCOUNTER — Ambulatory Visit (HOSPITAL_COMMUNITY)
Admission: RE | Admit: 2012-09-03 | Discharge: 2012-09-03 | Disposition: A | Discharge status: Home / Self Care | Payer: Medicaid Other | Source: Ambulatory Visit | Attending: Orthopedic Surgery | Admitting: Orthopedic Surgery

## 2012-09-03 DIAGNOSIS — IMO0001 Reserved for inherently not codable concepts without codable children: Secondary | ICD-10-CM | POA: Insufficient documentation

## 2012-09-03 DIAGNOSIS — M6281 Muscle weakness (generalized): Secondary | ICD-10-CM | POA: Insufficient documentation

## 2012-09-03 DIAGNOSIS — M7500 Adhesive capsulitis of unspecified shoulder: Secondary | ICD-10-CM

## 2012-09-03 DIAGNOSIS — M25519 Pain in unspecified shoulder: Secondary | ICD-10-CM | POA: Insufficient documentation

## 2012-09-03 DIAGNOSIS — M25619 Stiffness of unspecified shoulder, not elsewhere classified: Secondary | ICD-10-CM | POA: Insufficient documentation

## 2012-09-03 NOTE — Evaluation (Signed)
Occupational Therapy Evaluation  Patient Details  Name: [name] MRN: [id number] Date of Birth: 26-Jan-1976  Today's Date: 09/03/2012 Time: 1191-4782 OT Time Calculation (min): 41 min OT Evaluation 1015-1030 15' Manual Therapy 1030-1050 20' Visit#: 1  of 12   Re-eval: 10/01/12  Assessment Diagnosis: Left Shoulder Adhesive Capsulitis Prior Therapy: none  Authorization: Medicaid   Authorization Time Period: Requesting visits through 10/01/12.  After 3rd visit, patient must sign responsibility of payment form.  Authorization Visit#: 1  of 3    Past Medical History:  Past Medical History  Diagnosis Date  . Obesity     gastric Bypass, Roux-en Y   Past Surgical History:  Past Surgical History  Procedure Date  . Gastric bypass 04/20/2011    [place] 271lb preop  . Fertility surgery 2009    No abnormalities in female    Subjective S:  I have had pain in my left shoulder for about a month and a half.  Lavenia Atlas been to the ER a few times for it.  Pertinent History: Mrs. Nessel states that she woke up one morning in mid July with posterior left shoulder pain.  She went to the ER and urgent care 2 times in the past 6 weeks due to this pain, and has been given cortisone injections.  She was referred to Dr. Romeo Apple, received another injection, and has been referred to occupational therapy for evaluation and treatment.   Special Tests: UEFI:  41/80= 51% Patient Stated Goals: I want to be able to do normal things, like pick up my infant and put on a shirt without it hurting.   Pain Assessment Currently in Pain?: Yes Pain Score: 0-No pain Pain Location: Shoulder Pain Orientation: Left;Posterior Pain Type: Acute pain Pain Radiating Towards: pain radiates to her long finger.  Her pain level is 10/10 without medication.   Pain Onset: More than a month ago Pain Frequency: Intermittent  Precautions/Restrictions  Precautions Precautions: None  Prior Functioning  Home  Living Lives With: Family Prior Function Level of Independence: Independent with basic ADLs;Independent with homemaking with ambulation Driving: Yes Vocation: Unemployed Leisure: Hobbies-yes (Comment) Comments: Walking, hiking, bowling  Assessment ADL/Vision/Perception ADL ADL Comments: Using left arm to pick up infant, put a shirt on, reach overhead, lift laundry baskets, carrying her pocket book  are all quite painful activities.   Dominant Hand: Right Vision - History Baseline Vision: No visual deficits  Cognition/Observation Cognition Overall Cognitive Status: Appears within functional limits for tasks assessed  Sensation/Coordination/Edema Sensation Light Touch: Appears Intact Coordination Gross Motor Movements are Fluid and Coordinated: Yes Fine Motor Movements are Fluid and Coordinated: Yes  Additional Assessments LUE AROM (degrees) LUE Overall AROM Comments: assessed in seated.  ER/IR in adducted position Left Shoulder Flexion: 102 Degrees Left Shoulder ABduction: 81 Degrees Left Shoulder Internal Rotation: 90 Degrees Left Shoulder External Rotation: 35 Degrees LUE PROM (degrees) LUE Overall PROM Comments: assessed in supine.  Approximately 75% LUE Strength Left Shoulder Flexion: 4/5 Left Shoulder ABduction: 4/5 Left Shoulder Internal Rotation: 4/5 Left Shoulder External Rotation: 4/5 Palpation Palpation: Mod-max fascial restrictions noted in her left scapular region and upper arm.     Exercise/Treatments Manual Therapy Manual Therapy: Myofascial release Myofascial Release: MFR and manual stretching to left shoulder region, upper arm, and scapular region to decrease pain and restrictions and increase AROM and strength to WNL.    Occupational Therapy Assessment and Plan OT Assessment and Plan Clinical Impression Statement: A:  Patient is a 36 year old  female with pain and decreased mobility in her left shoulder for approximately 6 weeks.  These deficits are  effecting her ability to use her left upper extremity with daily activities, caring for her son, and participating in leisure activities.  Pt will benefit from skilled therapeutic intervention in order to improve on the following deficits: Decreased range of motion;Decreased strength;Increased muscle spasms;Increased fascial restricitons;Pain Rehab Potential: Excellent OT Frequency: Min 2X/week OT Duration: 6 weeks OT Treatment/Interventions: Self-care/ADL training;Therapeutic exercise;Manual therapy;Modalities;Therapeutic activities;Patient/family education OT Plan: P:  Skilled OT intervention to decrease pain and restrictions and increase AROM and strength to WNL for return to prior level of I with all B/IADLs, leisure activities.  Treatment Plan:  MFR and manual stretching, AAROM and PROM in supine.  seated ball stretches, elev, ext, ret, row, thumbtacks, prot/ret//elev/dep.  Progress as tolerated.     Goals Short Term Goals Time to Complete Short Term Goals: 3 weeks Short Term Goal 1: Patient will be educated on a HEP. Short Term Goal 2: Patient will increase PROM to [facility] for increased I donning a shirt. Short Term Goal 3: Patient will increase strength to 4+/5 for increased ability to pick up her infant. Short Term Goal 4: Patient will decrease pain to 5/10 with activitiy in her left shoulder. Short Term Goal 5: Patient will decrease fascial restrictions to moderate in her left shoulder region.  Long Term Goals Time to Complete Long Term Goals: 6 weeks Long Term Goal 1: Patient will return to her prior level of I with all B/IADLs and leisure activities.   Long Term Goal 2: Patient will increase left shoulder AROM to [facility] for increased ability to reach into overhead cabinets. Long Term Goal 3: Patient will increase left shoulder strength to 5/5 for increased ability to lift laundry baskets. Long Term Goal 4: Patient will decrease pain level to 3/10 in her left shoulder when picking up her  son. Long Term Goal 5: Patient will decrease fascial restrictions in her left shoulder to minimal.    Problem List Patient Active Problem List  Diagnosis  . Obesity  . 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 Frozen shoulder syndrome  . Pain in joint, shoulder region  . Muscle weakness (generalized)    End of Session Activity Tolerance: Patient tolerated treatment well General Behavior During Session: [facility] for tasks performed Cognition: [facility] for tasks performed OT Plan of Care OT Home Exercise Plan: Dowel rod exercises, shoulder stretches, towel slides.   Consulted and Agree with Plan of Care: Patient  GO    Shirlean Mylar, OTR/L  09/03/2012, 11:43 AM  Physician Documentation Your signature is required to indicate approval of the treatment plan as stated above.  Please sign and either send electronically or make a copy of this report for your files and return this physician signed original.  Please mark one 1.__approve of plan  2. ___approve of plan with the following conditions.   ______________________________                                                          _____________________ Physician Signature  Date  

## 2012-09-08 ENCOUNTER — Telehealth: Payer: Self-pay | Admitting: Orthopedic Surgery

## 2012-09-08 NOTE — Telephone Encounter (Signed)
No other recommendations   i dont need to see again   No operatve pathology   Finish therapy   Take ibuprofen

## 2012-09-08 NOTE — Telephone Encounter (Signed)
I called back to patient and relayed.

## 2012-09-08 NOTE — Telephone Encounter (Signed)
Patient called to ask about recommendations for left shoulder, asking about another injection (last injection here 08/28/12).  States she had "2 other cortisone injections within a short time period prior to that one."  States has been going to occupational therapy, and said has 3 visits left, which is # of visits insurance will cover.  Schedule appointment to discuss options, or other recommendation at this time?  Patient ph# [phone].

## 2012-09-09 ENCOUNTER — Telehealth: Payer: Self-pay | Admitting: Radiology

## 2012-09-09 ENCOUNTER — Other Ambulatory Visit: Payer: Self-pay | Admitting: *Deleted

## 2012-09-09 DIAGNOSIS — M25512 Pain in left shoulder: Secondary | ICD-10-CM

## 2012-09-09 NOTE — Telephone Encounter (Signed)
I called to give the patient her MRI appointment at [facility] on 09-11-12 at 1:30. Patient has Medicaid, authorization # [id number] and it expires on 10-08-12. Patient will follow up back in the office.

## 2012-09-11 ENCOUNTER — Ambulatory Visit (HOSPITAL_COMMUNITY)
Admission: RE | Admit: 2012-09-11 | Discharge: 2012-09-11 | Disposition: A | Discharge status: Home / Self Care | Payer: Medicaid Other | Source: Ambulatory Visit | Attending: Nurse Practitioner | Admitting: Nurse Practitioner

## 2012-09-11 ENCOUNTER — Ambulatory Visit (HOSPITAL_COMMUNITY)
Admission: RE | Admit: 2012-09-11 | Discharge: 2012-09-11 | Disposition: A | Discharge status: Home / Self Care | Payer: Medicaid Other | Source: Ambulatory Visit | Attending: Orthopedic Surgery | Admitting: Orthopedic Surgery

## 2012-09-11 DIAGNOSIS — M25519 Pain in unspecified shoulder: Secondary | ICD-10-CM

## 2012-09-11 DIAGNOSIS — M25512 Pain in left shoulder: Secondary | ICD-10-CM

## 2012-09-11 DIAGNOSIS — R937 Abnormal findings on diagnostic imaging of other parts of musculoskeletal system: Secondary | ICD-10-CM | POA: Insufficient documentation

## 2012-09-11 DIAGNOSIS — M25619 Stiffness of unspecified shoulder, not elsewhere classified: Secondary | ICD-10-CM | POA: Insufficient documentation

## 2012-09-11 DIAGNOSIS — M6281 Muscle weakness (generalized): Secondary | ICD-10-CM

## 2012-09-11 NOTE — Progress Notes (Signed)
Occupational Therapy Treatment Patient Details  Name: [name] MRN: [id number] Date of Birth: 12-Oct-1976  Today's Date: 09/11/2012 Time: 1191-4782 OT Time Calculation (min): 54 min Manual Therapy [phone] 15' [place] Exercises [phone] 20' IFES with heat 1014-1029 15' Visit#: 2  of 12   Re-eval: 10/01/12    Authorization: Medicaid   Authorization Time Period: 3 visits approved from 09/03/12-10/15/12, after 3rd visit must sign responsibility of payment form  Authorization Visit#: 2  of 3   Subjective S:  I have an MRI today.   Pain Assessment Currently in Pain?: Yes Pain Score:   3 Pain Location: Shoulder Pain Orientation: Left Pain Type: Acute pain  Precautions/Restrictions   Progress as tolerated  Exercise/Treatments Supine Protraction: PROM;10 reps Horizontal ABduction: PROM;10 reps External Rotation: PROM;10 reps Internal Rotation: PROM;10 reps Flexion: PROM;10 reps ABduction: PROM;10 reps Other Supine Exercises: bridges x 10 Seated Elevation: AROM;10 reps Extension: AROM;10 reps Row: AROM;10 reps Pulleys Flexion: 1 minute;Limitations Flexion Limitations: vg to keep body in midline and scapula depressed ABduction: 1 minute Other Pulley Exercises: vg to keep body in midline and scapula depressed Therapy Ball Flexion: 15 reps;Limitations Flexion Limitations: max vg and mod tactile cues to maintain alignment and depressed scapula ABduction: 15 reps ROM / Strengthening / Isometric Strengthening   Flexion: Supine;3X5" Extension: Supine;3X5" External Rotation: Supine;3X5" Internal Rotation: Supine;3X5" ABduction: Supine;3X5" ADduction: Supine;3X5"      Manual Therapy Manual Therapy: Myofascial release Myofascial Release: MFR and manual stretching to left shoulder region, upper arm, and scapular region to decrease pain and restrictions and increase AROM and strength to [id number] Electrical Stimulation Electrical Stimulation Location: IFES to  left shoulder with heat (no charge for heat) Electrical Stimulation Action: hi/low sweeping Electrical Stimulation Parameters: 8.5  Electrical Stimulation Goals: Pain  Occupational Therapy Assessment and Plan OT Assessment and Plan Clinical Impression Statement: A:  Internal rotation is most restricted movement for [place].  Added IFES and heat for pain control. OT Plan: P:  Decrease amount of cuing required with therapy ball stretches and pulley exercises as patient gains range and becomes more aware of her body movements.   Goals Short Term Goals Time to Complete Short Term Goals: 3 weeks Short Term Goal 1: Patient will be educated on a HEP. Short Term Goal 1 Progress: Progressing toward goal Short Term Goal 2: Patient will increase PROM to [facility] for increased I donning a shirt. Short Term Goal 2 Progress: Progressing toward goal Short Term Goal 3: Patient will increase strength to 4+/5 for increased ability to pick up her infant. Short Term Goal 3 Progress: Progressing toward goal Short Term Goal 4: Patient will decrease pain to 5/10 with activitiy in her left shoulder. Short Term Goal 4 Progress: Progressing toward goal Short Term Goal 5: Patient will decrease fascial restrictions to moderate in her left shoulder region.  Short Term Goal 5 Progress: Progressing toward goal Long Term Goals Time to Complete Long Term Goals: 6 weeks Long Term Goal 1: Patient will return to her prior level of I with all B/IADLs and leisure activities.   Long Term Goal 1 Progress: Progressing toward goal Long Term Goal 2: Patient will increase left shoulder AROM to [facility] for increased ability to reach into overhead cabinets. Long Term Goal 2 Progress: Progressing toward goal Long Term Goal 3: Patient will increase left shoulder strength to 5/5 for increased ability to lift laundry baskets. Long Term Goal 3 Progress: Progressing toward goal Long Term Goal 4: Patient will decrease pain level to 3/10  in her  left shoulder when picking up her son. Long Term Goal 4 Progress: Progressing toward goal Long Term Goal 5: Patient will decrease fascial restrictions in her left shoulder to minimal.   Long Term Goal 5 Progress: Progressing toward goal  Problem List Patient Active Problem List  Diagnosis  . Obesity  . 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 Frozen shoulder syndrome  . Pain in joint, shoulder region  . Muscle weakness (generalized)    End of Session Activity Tolerance: Patient tolerated treatment well General Behavior During Session: [facility] for tasks performed Cognition: [facility] for tasks performed  GO    Shirlean Mylar, OTR/L  09/11/2012, 10:25 AM

## 2012-09-17 ENCOUNTER — Telehealth: Payer: Self-pay | Admitting: Orthopedic Surgery

## 2012-09-17 ENCOUNTER — Other Ambulatory Visit: Payer: Self-pay | Admitting: *Deleted

## 2012-09-17 MED ORDER — CELECOXIB 200 MG PO CAPS: 200.0000 mg | ORAL_CAPSULE | Freq: Every day | ORAL | Status: DC

## 2012-09-17 NOTE — Telephone Encounter (Signed)
Celebrex 200 mg daily #30, 5 refills

## 2012-09-17 NOTE — Telephone Encounter (Signed)
[name] spoke with her gastric surgeon last night and she said he told her it is OK for her to take Celebrex as long as she takes a PPI .  Says she already takes Protonix.  Asking if you will call in a prescription for the Celebrex to [facility] in  [place].  2.  She has had the MRI done, but not scheduled to see you again until 11/26/12.  Asking if you will call her with the results.  Her # [phone]

## 2012-09-18 ENCOUNTER — Ambulatory Visit (HOSPITAL_COMMUNITY)
Admission: RE | Admit: 2012-09-18 | Discharge: 2012-09-18 | Disposition: A | Discharge status: Home / Self Care | Payer: Medicaid Other | Source: Ambulatory Visit | Attending: Nurse Practitioner | Admitting: Nurse Practitioner

## 2012-09-18 ENCOUNTER — Telehealth: Payer: Self-pay | Admitting: Orthopedic Surgery

## 2012-09-18 DIAGNOSIS — M25519 Pain in unspecified shoulder: Secondary | ICD-10-CM

## 2012-09-18 DIAGNOSIS — M6281 Muscle weakness (generalized): Secondary | ICD-10-CM

## 2012-09-18 NOTE — Telephone Encounter (Signed)
[name]'s disc of the MRI is on your desk.  Her # [phone]

## 2012-09-18 NOTE — Progress Notes (Signed)
Occupational Therapy Treatment Patient Details  Name: [name] MRN: [id number] Date of Birth: 07/06/1976  Today's Date: 09/18/2012 Time: 1478-2956 OT Time Calculation (min): 70 min Manual Therapy [phone] 30' Therapeutic Exercises [phone] 64' IFES with heat 1110-1125 15' Visit#: 3  of 12   Re-eval: 10/01/12    Authorization: Medicaid  Authorization Time Period: eval and 3 visits approved from 09/03/12-10/15/12, after 4th visit must sign responsibility of payment form, I have given her [name]'s number to discuss a medical hardship discount  Authorization Visit#: 3  of 4   Subjective S:  I got the results of my MRI.  (Reviewed with no indication of rotator cuff damage or labrum damage). Pain Assessment Currently in Pain?: Yes Pain Score:   4 Pain Location: Shoulder Pain Type: Acute pain  Precautions/Restrictions   progress to AAROM as PROM increases to 90%  Exercise/Treatments Supine Protraction: PROM;10 reps Horizontal ABduction: PROM;10 reps External Rotation: PROM;10 reps Internal Rotation: PROM;10 reps Flexion: PROM;10 reps ABduction: PROM;10 reps Other Supine Exercises: omit today Seated Elevation: AROM;15 reps Extension: AROM;15 reps Row: AROM;15 reps Pulleys Flexion: Limitations (1.5 min ) Flexion Limitations: vg to keep body in midline and scapula depressed ABduction: Limitations (1.5 min ) Other Pulley Exercises: vg to keep body in midline and scapula depressed Therapy Ball Flexion: 15 reps;Limitations Flexion Limitations: max vg and mod tactile cues to maintain alignment and depressed scapula ABduction: 15 reps;Limitations ABduction Limitations: min vg to maintain depressed scapula with abduction Stretches Other Shoulder Stretches: educated and completed  upper trap stretch, neck stretch, neck retraction, and upper thoracic stretch for HEP.  Complete 1 rep in clinic with 5" hold.       Manual Therapy: Myofascial release Myofascial  Release: MFR and manual stretching to left shoulder region, upper arm, and scapular region to decrease pain and restrictions and increase AROM and strength to WNL 1015-1045 Electrical Stimulation Electrical Stimulation Location: IFES to left shoulder with heat (no charge for heat) Electrical Stimulation Action: hi/low sweeping  Electrical Stimulation Parameters: began at 9.0 increased to 10.0 Electrical Stimulation Goals: Pain  Occupational Therapy Assessment and Plan OT Assessment and Plan Clinical Impression Statement: A:  Significant vasomotor response to MFR in cervical region.  Added cervical and thoracic stretches to HEP. OT Plan: P:  Increase rhomboid strength and scapular stability for increased scapular-humeral rhythm.     Goals Short Term Goals Time to Complete Short Term Goals: 3 weeks Short Term Goal 1: Patient will be educated on a HEP. Short Term Goal 1 Progress: Progressing toward goal Short Term Goal 2: Patient will increase PROM to [facility] for increased I donning a shirt. Short Term Goal 2 Progress: Progressing toward goal Short Term Goal 3: Patient will increase strength to 4+/5 for increased ability to pick up her infant. Short Term Goal 3 Progress: Progressing toward goal Short Term Goal 4: Patient will decrease pain to 5/10 with activitiy in her left shoulder. Short Term Goal 4 Progress: Progressing toward goal Short Term Goal 5: Patient will decrease fascial restrictions to moderate in her left shoulder region.  Short Term Goal 5 Progress: Progressing toward goal Long Term Goals Time to Complete Long Term Goals: 6 weeks Long Term Goal 1: Patient will return to her prior level of I with all B/IADLs and leisure activities.   Long Term Goal 1 Progress: Progressing toward goal Long Term Goal 2: Patient will increase left shoulder AROM to [facility] for increased ability to reach into overhead cabinets. Long Term Goal 2  Progress: Progressing toward goal Long Term Goal 3: Patient  will increase left shoulder strength to 5/5 for increased ability to lift laundry baskets. Long Term Goal 3 Progress: Progressing toward goal Long Term Goal 4: Patient will decrease pain level to 3/10 in her left shoulder when picking up her son. Long Term Goal 4 Progress: Progressing toward goal Long Term Goal 5: Patient will decrease fascial restrictions in her left shoulder to minimal.   Long Term Goal 5 Progress: Progressing toward goal  Problem List Patient Active Problem List  Diagnosis  . Obesity  . 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 Frozen shoulder syndrome  . Pain in joint, shoulder region  . Muscle weakness (generalized)    End of Session Activity Tolerance: Patient tolerated treatment well General Behavior During Session: [facility] for tasks performed Cognition: [facility] for tasks performed OT Plan of Care OT Home Exercise Plan: Cervical stretches.  GO    Shirlean Mylar, OTR/L  09/18/2012, 11:32 AM

## 2012-09-23 NOTE — Telephone Encounter (Signed)
Marijean Niemann, did you get this?

## 2012-09-23 NOTE — Telephone Encounter (Signed)
Patient aware.

## 2012-09-25 ENCOUNTER — Ambulatory Visit (HOSPITAL_COMMUNITY): Payer: Medicaid Other | Admitting: Specialist

## 2012-09-30 ENCOUNTER — Telehealth: Payer: Self-pay | Admitting: Orthopedic Surgery

## 2012-09-30 NOTE — Telephone Encounter (Signed)
Patient called to follow up on any correspondence we may have received from [facility], [place], regarding medication Celebrex, originally prescribed 09/17/12.  States that her insurance requires additional approval.  Please advise.  Patient ph# is [phone].

## 2012-10-02 ENCOUNTER — Ambulatory Visit (HOSPITAL_COMMUNITY)
Admission: RE | Admit: 2012-10-02 | Discharge: 2012-10-02 | Disposition: A | Discharge status: Home / Self Care | Payer: Medicaid Other | Source: Ambulatory Visit | Attending: Orthopedic Surgery | Admitting: Orthopedic Surgery

## 2012-10-02 DIAGNOSIS — M25519 Pain in unspecified shoulder: Secondary | ICD-10-CM | POA: Insufficient documentation

## 2012-10-02 DIAGNOSIS — M25619 Stiffness of unspecified shoulder, not elsewhere classified: Secondary | ICD-10-CM | POA: Insufficient documentation

## 2012-10-02 DIAGNOSIS — IMO0001 Reserved for inherently not codable concepts without codable children: Secondary | ICD-10-CM | POA: Insufficient documentation

## 2012-10-02 DIAGNOSIS — M6281 Muscle weakness (generalized): Secondary | ICD-10-CM

## 2012-10-02 NOTE — Telephone Encounter (Signed)
Patient was advised per [name] that we have not had a response from [facility]

## 2012-10-02 NOTE — Progress Notes (Signed)
Occupational Therapy Treatment Patient Details  Name: MALYNDA SMOLINSKI MRN: [id number] Date of Birth: 28-May-1976  Today's Date: 10/02/2012 Time: 2841-3244 OT Time Calculation (min): 69 min Manual Therapy: 109-140 31' Therapeutic Exercise: 140-213 33' IFES:213 -218  Visit#: 4  of 12   Re-eval: 10/15/12 Assessment Diagnosis: Left Shoulder Adhesive Capsulitis  Authorization:    Authorization Time Period: Pt states she has filled out forms for medical hardship discount with unlimited therapy visits. [name] has left message for [place]. Waiting for paperwork and message from [name] to verify further treatments. eval and 3 visits approved from 09/03/12-10/15/12, after 4th visit must sign responsibility of payment form, I have given her [name]'s number to discuss a medical hardship discount   Authorization Visit#: 4  of 4   Subjective Symptoms/Limitations Symptoms: S: Its really hard to get a bra on and reach to comb my hair.  Pain Assessment Pain Score:   6 Pain Location: Shoulder Pain Orientation: Left Pain Type: Acute pain  Precautions/Restrictions  Precautions Precautions: None  Exercise/Treatments Supine Protraction: PROM;10 reps Horizontal ABduction: PROM;10 reps External Rotation: PROM;10 reps Internal Rotation: PROM;10 reps Flexion: PROM;10 reps ABduction: PROM;10 reps Seated Elevation: 20 reps Extension: 20 reps Row: 20 reps   Pulleys Flexion: Limitations Flexion Limitations: vg to keep body in midline and scapula depressed ABduction: Limitations Other Pulley Exercises: vg to keep body in midline and scapula depressed Therapy Ball Flexion: 15 reps;Limitations Flexion Limitations: max vg and mod tactile cues to maintain alignment and depressed scapula ABduction: 15 reps;Limitations ABduction Limitations: min vg to maintain depressed scapula with abduction ROM / Strengthening / Isometric Strengthening   Flexion: 5X5" Extension: 5X5" External  Rotation: 5X5" Internal Rotation: 5X5" ABduction: 5X5" ADduction: 5X5"     Modalities Modalities: Electrical Stimulation Manual Therapy Manual Therapy: Myofascial release Myofascial Release: MFR and manual stretching to left shoulder region, upper arm, and scapular region to decrease pain and restrictions and increase AROM and strength to WNL Electrical Stimulation Electrical Stimulation Location: IFES to left shoulder with heat (no charge for heat)  Electrical Stimulation Action: hi/low sweeping taken  Electrical Stimulation Goals: Pain  Occupational Therapy Assessment and Plan OT Assessment and Plan Clinical Impression Statement: A: Pt tolerated increased reps in therapy ball stretches and AROM exercises. Pt states that today she felt she had better range during pulley exercise. Pt requires max vg cuing for staying midline and keeping shoulder depressed with therapy ball and pulley exercises. Pt requested IFES and heat at end of session; pt stated she was not in pain when she left therapy. Rehab Potential: Excellent OT Plan: P: Continue to work on increasing PROM and decreasing restrictions using MFR. Increase rhomboid strength and scapular stability for increased independence in ADLS.    Goals Short Term Goals Time to Complete Short Term Goals: 3 weeks Short Term Goal 1: Patient will be educated on a HEP. Short Term Goal 2: Patient will increase PROM to [facility] for increased I donning a shirt. Short Term Goal 3: Patient will increase strength to 4+/5 for increased ability to pick up her infant. Short Term Goal 4: Patient will decrease pain to 5/10 with activitiy in her left shoulder. Short Term Goal 5: Patient will decrease fascial restrictions to moderate in her left shoulder region.  Long Term Goals Time to Complete Long Term Goals: 6 weeks Long Term Goal 1: Patient will return to her prior level of I with all B/IADLs and leisure activities.   Long Term Goal 2: Patient will increase  left shoulder AROM to [facility] for increased ability to reach into overhead cabinets. Long Term Goal 3: Patient will increase left shoulder strength to 5/5 for increased ability to lift laundry baskets. Long Term Goal 4: Patient will decrease pain level to 3/10 in her left shoulder when picking up her son. Long Term Goal 5: Patient will decrease fascial restrictions in her left shoulder to minimal.    Problem List Patient Active Problem List  Diagnosis  . Obesity  . 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 Frozen shoulder syndrome  . Pain in joint, shoulder region  . Muscle weakness (generalized)    End of Session Activity Tolerance: Patient tolerated treatment well General Behavior During Session: [facility] for tasks performed Cognition: [facility] for tasks performed  GO    Noralee Stain, Darcey Cardy L 10/02/2012, 2:39 PM

## 2012-10-08 ENCOUNTER — Ambulatory Visit (HOSPITAL_COMMUNITY)
Admission: RE | Admit: 2012-10-08 | Discharge: 2012-10-08 | Disposition: A | Discharge status: Home / Self Care | Payer: Medicaid Other | Source: Ambulatory Visit | Attending: Occupational Therapy | Admitting: Occupational Therapy

## 2012-10-08 DIAGNOSIS — M6281 Muscle weakness (generalized): Secondary | ICD-10-CM

## 2012-10-08 DIAGNOSIS — M25519 Pain in unspecified shoulder: Secondary | ICD-10-CM

## 2012-10-08 NOTE — Progress Notes (Signed)
Occupational Therapy Treatment Patient Details  Name: [name] MRN: [id number] Date of Birth: 1976-02-01  Today's Date: 10/08/2012 Time: 3244-0102 OT Time Calculation (min): 60 min Manual Therapy 501-524 23' Therapeutic Exercise 525-545 20' IFES with Moist Heat 546-601 15'  Visit#: 5  of 12   Re-eval: 10/15/12    Authorization: Medicaid  Authorization Time Period: Pt states she has filled out forms for medical hardship discount with unlimited therapy visits. [name] has left message for [name]. Waiting for paperwork and message from [name] to verify further treatments. eval and 3 visits approved from 09/03/12-10/15/12, after 4th visit must sign responsibility of payment form, I have given her [name]'s number to discuss a medical hardship discount  Authorization Visit#: 5  of 5   Subjective Symptoms/Limitations Symptoms: S:  It is better with some movements.  Precautions/Restrictions     Exercise/Treatments Supine Protraction: PROM;AAROM;10 reps Horizontal ABduction: PROM;AROM;10 reps External Rotation: PROM;AROM;10 reps Internal Rotation: PROM;AROM;10 reps Flexion: PROM;AROM;10 reps ABduction: PROM;AROM;10 reps Seated Elevation: 20 reps Extension: 20 reps Row: 20 reps Pulleys Flexion: 2 minutes ABduction: 2 minutes Therapy Ball Flexion: 20 reps ABduction: 20 reps    Manual Therapy Manual Therapy: Myofascial release Myofascial Release: MFR and manual stretching to left shoulder region, upper arm, and scapular region to decrease pain and restrictions and increase AROM and strength to WNL   Occupational Therapy Assessment and Plan OT Assessment and Plan Clinical Impression Statement: A:  Added AAROM supine.  Pt. required less verbal and tactile cueing to keep shoulder depressed with ball and pulleys.  Pt. stated husband tried to "pop" her shoulder over the weekend and it caused quite a bit of pain but she is better now.. OT Plan: P;  Increase reps  with AAROM.   Goals Short Term Goals Time to Complete Short Term Goals: 3 weeks Short Term Goal 1: Patient will be educated on a HEP. Short Term Goal 2: Patient will increase PROM to [facility] for increased I donning a shirt. Short Term Goal 3: Patient will increase strength to 4+/5 for increased ability to pick up her infant. Short Term Goal 4: Patient will decrease pain to 5/10 with activitiy in her left shoulder. Short Term Goal 5: Patient will decrease fascial restrictions to moderate in her left shoulder region.  Long Term Goals Time to Complete Long Term Goals: 6 weeks Long Term Goal 1: Patient will return to her prior level of I with all B/IADLs and leisure activities.   Long Term Goal 2: Patient will increase left shoulder AROM to [facility] for increased ability to reach into overhead cabinets. Long Term Goal 3: Patient will increase left shoulder strength to 5/5 for increased ability to lift laundry baskets. Long Term Goal 4: Patient will decrease pain level to 3/10 in her left shoulder when picking up her son. Long Term Goal 5: Patient will decrease fascial restrictions in her left shoulder to minimal.    Problem List Patient Active Problem List  Diagnosis  . Obesity  . 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 Frozen shoulder syndrome  . Pain in joint, shoulder region  . Muscle weakness (generalized)    End of Session Activity Tolerance: Patient tolerated treatment well General Behavior During Session: [facility] for tasks performed Cognition: [facility] for tasks performed  GO   [name], COTA/L  10/08/2012, 6:00 PM

## 2012-10-09 ENCOUNTER — Ambulatory Visit (HOSPITAL_COMMUNITY)
Admission: RE | Admit: 2012-10-09 | Discharge: 2012-10-09 | Disposition: A | Discharge status: Home / Self Care | Payer: Medicaid Other | Source: Ambulatory Visit | Attending: Orthopedic Surgery | Admitting: Orthopedic Surgery

## 2012-10-09 DIAGNOSIS — M6281 Muscle weakness (generalized): Secondary | ICD-10-CM

## 2012-10-09 DIAGNOSIS — M25519 Pain in unspecified shoulder: Secondary | ICD-10-CM

## 2012-10-09 NOTE — Progress Notes (Deleted)
Occupational Therapy Treatment Patient Details  Name: [name] MRN: [id number] Date of Birth: 06-26-1976  Today's Date: 10/09/2012 Time: 4401-0272 OT Time Calculation (min): 73 min Manual Therapy 501-524 23' Therapeutic Exercise 525-545 20' IFES with Moist Heat 546-601 15'  Visit#: 6  of 12   Re-eval: 10/15/12 Assessment Diagnosis: Left Shoulder Adhesive Capsulitis  Authorization: Medicaid  Authorization Time Period: Pt states she has filled out forms for medical hardship discount with unlimited therapy visits. [name] has left message for [place]. Waiting for paperwork and message from [name] to verify further treatments. eval and 3 visits approved from 09/03/12-10/15/12, after 4th visit must sign responsibility of payment form, I have given her [name]'s number to discuss a medical hardship discount  Authorization Visit#: 6  of 6   Subjective Symptoms/Limitations Symptoms: S:  It always hurts more in the morning.  I have to take pain meds at 3am Pain Assessment Currently in Pain?: Yes Pain Score:   6 Pain Location: Shoulder Pain Orientation: Left Pain Type: Acute pain  Precautions/Restrictions     Exercise/Treatments Supine Protraction: PROM;AAROM;12 reps Horizontal ABduction: PROM;AROM;12 reps External Rotation: PROM;AROM;12 reps Internal Rotation: PROM;AROM;12 reps Flexion: PROM;AROM;12 reps ABduction: PROM;AROM;12 reps Seated Elevation: 20 reps Extension: 20 reps Row: 20 reps Pulleys Flexion: 2 minutes ABduction: 2 minutes Therapy Ball Flexion: 20 reps ABduction: 20 reps (with green ball on the mat)    Manual Therapy Manual Therapy: Myofascial release Myofascial Release: MFR and manual stretching to left shoulder region, upper arm, and scapular region to decrease pain and restrictions and increase AROM and strength to WNL  Electrical Stimulation Electrical Stimulation Location: IFES to left shoulder with heat (no charge for  heat Electrical Stimulation Action: hi/low sweeping  Electrical Stimulation Goals: Pain  Occupational Therapy Assessment and Plan OT Assessment and Plan Clinical Impression Statement: A:  Changed ball stretch with ABD to using green ball on mat..  Added corner stretch and cross chest stretch. OT Plan: P:  Attempt to increase ROM by 5 degrees.   Goals Short Term Goals Time to Complete Short Term Goals: 3 weeks Short Term Goal 1: Patient will be educated on a HEP. Short Term Goal 2: Patient will increase PROM to [facility] for increased I donning a shirt. Short Term Goal 3: Patient will increase strength to 4+/5 for increased ability to pick up her infant. Short Term Goal 4: Patient will decrease pain to 5/10 with activitiy in her left shoulder. Short Term Goal 5: Patient will decrease fascial restrictions to moderate in her left shoulder region.  Long Term Goals Time to Complete Long Term Goals: 6 weeks Long Term Goal 1: Patient will return to her prior level of I with all B/IADLs and leisure activities.   Long Term Goal 2: Patient will increase left shoulder AROM to [facility] for increased ability to reach into overhead cabinets. Long Term Goal 3: Patient will increase left shoulder strength to 5/5 for increased ability to lift laundry baskets. Long Term Goal 4: Patient will decrease pain level to 3/10 in her left shoulder when picking up her son. Long Term Goal 5: Patient will decrease fascial restrictions in her left shoulder to minimal.    Problem List Patient Active Problem List  Diagnosis  . Obesity  . 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 Frozen shoulder syndrome  . Pain in joint, shoulder region  . Muscle weakness (generalized)    End of Session Activity Tolerance: Patient tolerated treatment well General Behavior During Session:  [facility] for tasks performed Cognition: [facility] for tasks performed  GO   Eylin Pontarelli L. Derriana Oser, COTA/L  10/09/2012, 3:51 PM

## 2012-10-09 NOTE — Progress Notes (Signed)
Patient Details  Name: [name] MRN: [id number] Date of Birth: 02/08/1976   Corrected Note for 10/08/12  Occupational Therapy Treatment  Patient Details   Name: [name] MRN: [id number] Date of Birth: 1976/02/24  Today's Date: 10/08/2012 Time: 7829-5621 OT Time Calculation (min): 60 min Manual Therapy 501-524 23' Therapeutic Exercise 525-545 20' IFES with Moist Heat 546-601 15'  Visit#: 5 of 12   Re-eval: 10/15/12   Authorization: Medicaid  Authorization Time Period: Pt states she has filled out forms for medical hardship discount with unlimited therapy visits. [name] has left message for [place]. Waiting for paperwork and message from [name] to verify further treatments. eval and 3 visits approved from 09/03/12-10/15/12, after 4th visit must sign responsibility of payment form, I have given her [name]'s number to discuss a medical hardship discount  Authorization Visit#: 5 of 5   Subjective Symptoms/Limitations Symptoms: S:  It is better with some movements.  Precautions/Restrictions    Exercise/Treatments Supine Protraction: PROM;AAROM;10 reps Horizontal ABduction: PROM;AROM;10 reps External Rotation: PROM;AROM;10 reps Internal Rotation: PROM;AROM;10 reps Flexion: PROM;AROM;10 reps ABduction: PROM;AROM;10 reps Seated Elevation: 20 reps Extension: 20 reps Row: 20 reps Pulleys Flexion: 2 minutes ABduction: 2 minutes Therapy Ball Flexion: 20 reps ABduction: 20 reps   Manual Therapy Manual Therapy: Myofascial release Myofascial Release: MFR and manual stretching to left shoulder region, upper arm, and scapular region to decrease pain and restrictions and increase AROM and strength to WNL  Electrical Stimulation Electrical Stimulation Location: IFES to left shoulder with heat (no charge for heat Electrical Stimulation Action: hi/low sweeping  Electrical Stimulation Goals: Pain  Occupational Therapy Assessment and Plan  OT  Assessment and Plan Clinical Impression Statement: A:  Added AAROM supine.  Pt. required less verbal and tactile cueing to keep shoulder depressed with ball and pulleys.  Pt. stated husband tried to "pop" her shoulder over the weekend and it caused quite a bit of pain but she is better now.. OT Plan: P;  Increase reps with AAROM.    Goals Short Term Goals Time to Complete Short Term Goals: 3 weeks Short Term Goal 1: Patient will be educated on a HEP. Short Term Goal 2: Patient will increase PROM to [facility] for increased I donning a shirt. Short Term Goal 3: Patient will increase strength to 4+/5 for increased ability to pick up her infant. Short Term Goal 4: Patient will decrease pain to 5/10 with activitiy in her left shoulder. Short Term Goal 5: Patient will decrease fascial restrictions to moderate in her left shoulder region.   Long Term Goals Time to Complete Long Term Goals: 6 weeks Long Term Goal 1: Patient will return to her prior level of I with all B/IADLs and leisure activities.    Long Term Goal 2: Patient will increase left shoulder AROM to [facility] for increased ability to reach into overhead cabinets. Long Term Goal 3: Patient will increase left shoulder strength to 5/5 for increased ability to lift laundry baskets. Long Term Goal 4: Patient will decrease pain level to 3/10 in her left shoulder when picking up her son. Long Term Goal 5: Patient will decrease fascial restrictions in her left shoulder to minimal.    Problem List Patient Active Problem List   Diagnosis   .  Obesity   .  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  Frozen shoulder syndrome   .  Pain in joint, shoulder region   .  Muscle weakness (generalized)  End of Session Activity Tolerance: Patient tolerated treatment well General Behavior During Session: [facility] for tasks performed Cognition: [facility] for tasks performed  GO  Thamas Appleyard L. Chanze Teagle, COTA/L   10/08/2012, 6:00 PM

## 2012-10-09 NOTE — Progress Notes (Signed)
Occupational Therapy Treatment Patient Details  Name: [name] MRN: [id number] Date of Birth: [birth year]  Today's Date: 10/09/2012 Time: 7829-5621 OT Time Calculation (min): 73 min Manual Therapy 225-253 28' Therapeutic Exercise 254-322 41' IFES with moist heat 323-338 15'   Visit#: 6  of 12   Re-eval: 10/15/12 Assessment Diagnosis: Left Shoulder Adhesive Capsulitis  Authorization: Medicaid  Authorization Time Period: Pt states she has filled out forms for medical hardship discount with unlimited therapy visits. [name] has left message for [employer]. Waiting for paperwork and message from [name] to verify further treatments. eval and 3 visits approved from 09/03/12-10/15/12, after 4th visit must sign responsibility of payment form, I have given her [name]'s number to discuss a medical hardship discount  Authorization Visit#: 6  of 6   Subjective Symptoms/Limitations Symptoms: S:  It always hurts more in the morning.  I have to take pain meds at 3am Pain Assessment Currently in Pain?: Yes Pain Score:   6 Pain Location: Shoulder Pain Orientation: Left Pain Type: Acute pain  Precautions/Restrictions     Exercise/Treatments Supine Protraction: PROM;AAROM;12 reps Horizontal ABduction: PROM;AROM;12 reps External Rotation: PROM;AROM;12 reps Internal Rotation: PROM;AROM;12 reps Flexion: PROM;AROM;12 reps ABduction: PROM;AROM;12 reps Seated Elevation: 20 reps Extension: 20 reps Row: 20 reps Pulleys Flexion: 2 minutes ABduction: 2 minutes Therapy Ball Flexion: 20 reps ABduction: 20 reps (with green ball on the mat) Stretches Corner Stretch: 5 reps;10 seconds Cross Chest Stretch: 5 reps;10 seconds      Manual Therapy Manual Therapy: Myofascial release Myofascial Release: MFR and manual stretching to left shoulder region, upper arm, and scapular region to decrease pain and restrictions and increase AROM and strength to WNL  Electrical  Stimulation Electrical Stimulation Location: IFES to left shoulder with heat (no charge for heat Electrical Stimulation Action: hi/low sweeping  Electrical Stimulation Goals: Pain  Occupational Therapy Assessment and Plan OT Assessment and Plan Clinical Impression Statement: A:  Changed ball stretch with ABD to using green ball on mat..  Added corner stretch and cross chest stretch. OT Plan: P:  Attempt to increase ROM by 5 degrees.   Goals Short Term Goals Time to Complete Short Term Goals: 3 weeks Short Term Goal 1: Patient will be educated on a HEP. Short Term Goal 2: Patient will increase PROM to [facility] for increased I donning a shirt. Short Term Goal 3: Patient will increase strength to 4+/5 for increased ability to pick up her infant. Short Term Goal 4: Patient will decrease pain to 5/10 with activitiy in her left shoulder. Short Term Goal 5: Patient will decrease fascial restrictions to moderate in her left shoulder region.  Long Term Goals Time to Complete Long Term Goals: 6 weeks Long Term Goal 1: Patient will return to her prior level of I with all B/IADLs and leisure activities.   Long Term Goal 2: Patient will increase left shoulder AROM to [facility] for increased ability to reach into overhead cabinets. Long Term Goal 3: Patient will increase left shoulder strength to 5/5 for increased ability to lift laundry baskets. Long Term Goal 4: Patient will decrease pain level to 3/10 in her left shoulder when picking up her son. Long Term Goal 5: Patient will decrease fascial restrictions in her left shoulder to minimal.    Problem List Patient Active Problem List  Diagnosis  . Obesity  . 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 Frozen shoulder syndrome  . Pain in joint, shoulder region  . Muscle weakness (generalized)  End of Session Activity Tolerance: Patient tolerated treatment well General Behavior During Session: [facility] for tasks performed Cognition:  [facility] for tasks performed  GO   [name] L. Dosia Yodice, COTA/L  10/09/2012, 3:37 PM

## 2012-10-12 ENCOUNTER — Encounter (HOSPITAL_COMMUNITY): Payer: Self-pay | Admitting: Emergency Medicine

## 2012-10-12 ENCOUNTER — Emergency Department (HOSPITAL_COMMUNITY)
Admission: EM | Admit: 2012-10-12 | Discharge: 2012-10-12 | Disposition: A | Discharge status: Home / Self Care | Payer: Medicaid Other | Attending: Emergency Medicine | Admitting: Emergency Medicine

## 2012-10-12 DIAGNOSIS — M79609 Pain in unspecified limb: Secondary | ICD-10-CM | POA: Insufficient documentation

## 2012-10-12 DIAGNOSIS — Z9884 Bariatric surgery status: Secondary | ICD-10-CM | POA: Insufficient documentation

## 2012-10-12 DIAGNOSIS — M25519 Pain in unspecified shoulder: Secondary | ICD-10-CM | POA: Insufficient documentation

## 2012-10-12 DIAGNOSIS — Z880 Allergy status to penicillin: Secondary | ICD-10-CM | POA: Insufficient documentation

## 2012-10-12 DIAGNOSIS — Z886 Allergy status to analgesic agent status: Secondary | ICD-10-CM | POA: Insufficient documentation

## 2012-10-12 DIAGNOSIS — M79603 Pain in arm, unspecified: Secondary | ICD-10-CM

## 2012-10-12 MED ORDER — OXYCODONE-ACETAMINOPHEN 5-325 MG PO TABS
1.0000 | ORAL_TABLET | Freq: Once | ORAL | Status: AC
  Administered 2012-10-12: 1 via ORAL
  Filled 2012-10-12: qty 1

## 2012-10-12 NOTE — ED Provider Notes (Signed)
History     CSN: [id number]  Arrival date & time 10/12/12  4098   First MD Initiated Contact with Patient 10/12/12 0831      Chief Complaint  Patient presents with  . Shoulder Pain    (Consider location/radiation/quality/duration/timing/severity/associated sxs/prior treatment) HPI Comments: Pt c/o diffuse pain in B arms especially in the mornings.  She is currently being treated for a frozeen shoulder by dr. Romeo Apple and has been undergoing therapy.  Dr. Tanya Nones is her PCP.  Dr. Romeo Apple told her she must get another referral from her PCP to continue seeing him.  Also he was authorized only for the shoulder tx and not the other extremity complaints.  She recently had x-rays of the c-spine to evaluate her complaint but was not given information on the results.  She is requesting an MRI of her neck.  She has refills for more hydrocodone.  It is ~ 2 weeks until she sees her PCP and ? Til she sees dr. Romeo Apple.  Patient is a 36 y.o. female presenting with shoulder pain. The history is provided by the patient. No language interpreter was used.  Shoulder Pain This is a chronic problem. Episode onset: ~ 4 months ago. The problem occurs intermittently. The problem has been unchanged. Associated symptoms include arthralgias and numbness. Pertinent negatives include no joint swelling, neck pain or weakness. Nothing aggravates the symptoms. She has tried nothing for the symptoms.    Past Medical History  Diagnosis Date  . Obesity     gastric Bypass, Roux-en Y    Past Surgical History  Procedure Date  . Gastric bypass 04/20/2011    [place], wt 271lb preop  . Fertility surgery 2009    No abnormalities in female    Family History  Problem Relation Age of Onset  . Diabetes Father   . Hyperlipidemia Father   . Hypertension Father   . Cancer Father     skin cancer  . Depression Mother     lifelong  . Anesthesia problems Neg Hx   . Arthritis      History  Substance Use Topics    . Smoking status: Never Smoker   . Smokeless tobacco: Never Used  . Alcohol Use: No    OB History    Grav Para Term Preterm Abortions TAB SAB Ect Mult Living   3 3 3  0 0 0 0 0 0 3      Review of Systems  HENT: Negative for neck pain.   Musculoskeletal: Positive for arthralgias. Negative for joint swelling.  Neurological: Positive for numbness. Negative for weakness.  All other systems reviewed and are negative.    Allergies  Morphine and related; Nsaids; and Penicillins  Home Medications   Current Outpatient Rx  Name Route Sig Dispense Refill  . CELECOXIB 200 MG PO CAPS Oral Take 1 capsule (200 mg total) by mouth daily. 30 capsule 5  . VITAMIN B-12 IJ Injection Inject 1 each as directed every 30 (thirty) days. Vitamin B-12 1087mcg/ml Injection    . FENUGREEK PO Oral Take 3-4 capsules by mouth 4 (four) times daily. Take 4 capsules in the morning and at night, and take 3 capsules in the afternoon and evening (to equal four times a day)    . METOCLOPRAMIDE HCL 10 MG PO TABS Oral Take 10 mg by mouth 3 (three) times daily.    Marland Kitchen PANTOPRAZOLE SODIUM 20 MG PO TBEC Oral Take 2 tablets (40 mg total) by mouth daily. 30 tablet 0  .  PRENATAL MULTIVITAMIN CH Oral Take 1 tablet by mouth daily.      BP 110/75  Pulse 66  Temp 97.7 F (36.5 C)  Resp 18  Ht 5\' 5"  (1.651 m)  Wt 135 lb (61.236 kg)  BMI 22.47 kg/m2  SpO2 96%  Physical Exam  Nursing note and vitals reviewed. Constitutional: She is oriented to person, place, and time. She appears well-developed and well-nourished. No distress.  HENT:  Head: Normocephalic and atraumatic.  Eyes: EOM are normal.  Neck: Normal range of motion.  Cardiovascular: Normal rate, regular rhythm and normal heart sounds.   Pulmonary/Chest: Effort normal and breath sounds normal.  Abdominal: Soft. She exhibits no distension. There is no tenderness.  Musculoskeletal: Normal range of motion.       Diffuse muscle and joint pain in B upper  extremities.  No reduced ROM .  No visible swelling or redness in any joints.    Skin intact.  Neurological: She is alert and oriented to person, place, and time.  Skin: Skin is warm and dry.  Psychiatric: She has a normal mood and affect. Judgment normal.    ED Course  Procedures (including critical care time)  Labs Reviewed - No data to display No results found.   1. Upper extremity pain, diffuse       MDM  Take you pain med as prescribed by dr. Romeo Apple.  Follow up with him and dr. Tanya Nones ASAP.        Evalina Field, [place] 10/12/12 [phone]

## 2012-10-12 NOTE — ED Notes (Signed)
Pt c/o bilateral shoulder/arm pain since 0200. Pt states she has h/s left frozen shoulder syndrome.

## 2012-10-13 ENCOUNTER — Other Ambulatory Visit: Payer: Self-pay | Admitting: Physician Assistant

## 2012-10-13 ENCOUNTER — Ambulatory Visit (HOSPITAL_COMMUNITY)
Admission: RE | Admit: 2012-10-13 | Discharge: 2012-10-13 | Disposition: A | Discharge status: Home / Self Care | Payer: Medicaid Other | Source: Ambulatory Visit | Attending: Physician Assistant | Admitting: Physician Assistant

## 2012-10-13 DIAGNOSIS — M542 Cervicalgia: Secondary | ICD-10-CM | POA: Insufficient documentation

## 2012-10-13 DIAGNOSIS — M79609 Pain in unspecified limb: Secondary | ICD-10-CM | POA: Insufficient documentation

## 2012-10-14 ENCOUNTER — Ambulatory Visit (HOSPITAL_COMMUNITY)
Admission: RE | Admit: 2012-10-14 | Discharge: 2012-10-14 | Disposition: A | Discharge status: Home / Self Care | Payer: Medicaid Other | Source: Ambulatory Visit | Attending: Orthopedic Surgery | Admitting: Orthopedic Surgery

## 2012-10-14 DIAGNOSIS — M6281 Muscle weakness (generalized): Secondary | ICD-10-CM

## 2012-10-14 DIAGNOSIS — M25519 Pain in unspecified shoulder: Secondary | ICD-10-CM

## 2012-10-14 NOTE — ED Provider Notes (Signed)
Medical screening examination/treatment/procedure(s) were performed by non-physician practitioner and as supervising physician I was immediately available for consultation/collaboration.   Alishba Naples M Waino Mounsey, DO 10/14/12 2048 

## 2012-10-14 NOTE — Progress Notes (Addendum)
Occupational Therapy Treatment Patient Details  Name: [name] MRN: [id number] Date of Birth: 1976-12-23  Today's Date: 10/14/2012 Time: 4098-1191 OT Time Calculation (min): 88 min Reassess 146-205 19' Manual Therapy 206-233 27'  Therapeutic Exercise 234-258 14' IFES with Moist Heat 259-314 15'  Visit#: 7  of 12   Re-eval: 10/24/12    Authorization: Medicaid  Authorization Time Period:    Authorization Visit#: 7  of 7   Subjective Symptoms/Limitations Symptoms: S:  The doctor took an x-ray of my neck he thinks I have a neck thing going on too. Special Tests: UEFI was 51% and is currently 58%  Precautions/Restrictions     Exercise/Treatments Supine Protraction: PROM;AAROM;15 reps Horizontal ABduction: PROM;AAROM;15 reps External Rotation: PROM;AAROM;15 reps Internal Rotation: PROM;AAROM;15 reps Flexion: PROM;AAROM;15 reps ABduction: PROM;AAROM;15 reps Seated Elevation: 20 reps;AROM Extension: 20 reps;AROM Row: 20 reps;AROM Standing Extension: Theraband;10 reps Theraband Level (Shoulder Extension): Level 2 (Red) Row: Theraband;10 reps Theraband Level (Shoulder Row): Level 2 (Red) Retraction: Theraband;10 reps Theraband Level (Shoulder Retraction): Level 2 (Red) Pulleys Flexion: 2 minutes ABduction: 2 minutes Therapy Ball Flexion: 20 reps ABduction: 20 reps Right/Left: Other (comment) (attempted, patient unable) ROM / Strengthening / Isometric Strengthening Thumb Tacks: 1'   Teacher, music: 5 reps;10 seconds Cross Chest Stretch: 5 reps;10 seconds  Manual Therapy Manual Therapy: Myofascial release Myofascial Release: MFR and manual stretching to left shoulder region, upper arm, and scapular region to decrease pain and restrictions and increase AROM and strength to WNL   Occupational Therapy Assessment and Plan OT Assessment and Plan Clinical Impression Statement: A:  See progress note.  Added thumbtacks and scapular strengthening  with tband.  Patient with very forward round shoulders.  Education to patient on keeping shoulders retracted. Rehab Potential: Excellent OT Plan: P: Add wall wash.   Goals Short Term Goals Time to Complete Short Term Goals: 3 weeks Short Term Goal 1: Patient will be educated on a HEP. Short Term Goal 2: Patient will increase PROM to [facility] for increased I donning a shirt. Short Term Goal 3: Patient will increase strength to 4+/5 for increased ability to pick up her infant. Short Term Goal 4: Patient will decrease pain to 5/10 with activitiy in her left shoulder. Short Term Goal 5: Patient will decrease fascial restrictions to moderate in her left shoulder region.  Long Term Goals Time to Complete Long Term Goals: 6 weeks Long Term Goal 1: Patient will return to her prior level of I with all B/IADLs and leisure activities.   Long Term Goal 2: Patient will increase left shoulder AROM to [facility] for increased ability to reach into overhead cabinets. Long Term Goal 3: Patient will increase left shoulder strength to 5/5 for increased ability to lift laundry baskets. Long Term Goal 4: Patient will decrease pain level to 3/10 in her left shoulder when picking up her son. Long Term Goal 5: Patient will decrease fascial restrictions in her left shoulder to minimal.    Problem List Patient Active Problem List  Diagnosis  . Obesity  . 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 Frozen shoulder syndrome  . Pain in joint, shoulder region  . Muscle weakness (generalized)    End of Session Activity Tolerance: Patient tolerated treatment well General Behavior During Session: [facility] for tasks performed Cognition: [facility] for tasks performed  GO    Yandel Zeiner L. Tamiko Leopard, COTA/L  10/14/2012, 5:03 PM

## 2012-10-16 ENCOUNTER — Ambulatory Visit (HOSPITAL_COMMUNITY)
Admission: RE | Admit: 2012-10-16 | Discharge: 2012-10-16 | Disposition: A | Discharge status: Home / Self Care | Payer: Medicaid Other | Source: Ambulatory Visit | Attending: Orthopedic Surgery | Admitting: Orthopedic Surgery

## 2012-10-16 DIAGNOSIS — M25519 Pain in unspecified shoulder: Secondary | ICD-10-CM

## 2012-10-16 DIAGNOSIS — M6281 Muscle weakness (generalized): Secondary | ICD-10-CM

## 2012-10-16 NOTE — Progress Notes (Signed)
Occupational Therapy Treatment Patient Details  Name: [name] MRN: [id number] Date of Birth: 1976/12/31  Today's Date: 10/16/2012 Time: 1191-4782 OT Time Calculation (min): 72 min Manual Therapy 147-210 23' Therapeutic Exercise 211-243 39' IFES with moist heat 244-259 15' Visit#: 8  of 12   Re-eval: 10/24/12 Assessment Diagnosis: Left Shoulder Adhesive Capsulitis  Authorization: Medicaid  Authorization Time Period: awaiting approved visits from [facility].  Authorization Visit#: 8  of 8   Subjective Symptoms/Limitations Symptoms: S:  I don't have any pain now but I was a 6/10.  I have double pain meds in me.  Precautions/Restrictions     Exercise/Treatments Supine Protraction: PROM;AAROM;15 reps Horizontal ABduction: PROM;AAROM;15 reps External Rotation: PROM;AAROM;15 reps Internal Rotation: PROM;AAROM;15 reps Flexion: PROM;AAROM;15 reps ABduction: PROM;AAROM;15 reps Seated Elevation: 20 reps;AROM Extension: 20 reps;AROM Row: 20 reps;AROM Standing External Rotation: Theraband;12 reps Theraband Level (Shoulder External Rotation): Level 2 (Red) Internal Rotation: Theraband;12 reps Theraband Level (Shoulder Internal Rotation): Level 2 (Red) Extension: Theraband;12 reps Theraband Level (Shoulder Extension): Level 2 (Red) Row: Theraband;12 reps Theraband Level (Shoulder Row): Level 2 (Red) Retraction: Theraband;12 reps Theraband Level (Shoulder Retraction): Level 2 (Red) Pulleys Flexion: 2 minutes ABduction: 2 minutes Therapy Ball Flexion: 25 reps ABduction: 25 reps     Modalities Modalities: Electrical Stimulation;Moist Heat Manual Therapy Manual Therapy: Myofascial release Myofascial Release: MFR and manual stretching to left shoulder region, upper arm, and scapular region to decrease pain and restrictions and increase AROM and strength to WNL Moist Heat Therapy Number Minutes Moist Heat: 15 Minutes Moist Heat Location: Shoulder Electrical  Stimulation Electrical Stimulation Location: IFES with moist heat (no charge for heat) Electrical Stimulation Action: hi/low sweep Electrical Stimulation Parameters: to patients comfort level Electrical Stimulation Goals: Pain  Occupational Therapy Assessment and Plan OT Assessment and Plan Clinical Impression Statement: A:  Increased tolerance to PROM and increased PROM today.  Patient did take extra pain meds before coming to therapy today. OT Plan: P:  Add wall wash to increase AROM.  If unable to do wall wash do wedge wash.   Goals Short Term Goals Time to Complete Short Term Goals: 3 weeks Short Term Goal 1: Patient will be educated on a HEP. Short Term Goal 2: Patient will increase PROM to [facility] for increased I donning a shirt. Short Term Goal 3: Patient will increase strength to 4+/5 for increased ability to pick up her infant. Short Term Goal 4: Patient will decrease pain to 5/10 with activitiy in her left shoulder. Short Term Goal 5: Patient will decrease fascial restrictions to moderate in her left shoulder region.  Long Term Goals Time to Complete Long Term Goals: 6 weeks Long Term Goal 1: Patient will return to her prior level of I with all B/IADLs and leisure activities.   Long Term Goal 2: Patient will increase left shoulder AROM to [facility] for increased ability to reach into overhead cabinets. Long Term Goal 3: Patient will increase left shoulder strength to 5/5 for increased ability to lift laundry baskets. Long Term Goal 4: Patient will decrease pain level to 3/10 in her left shoulder when picking up her son. Long Term Goal 5: Patient will decrease fascial restrictions in her left shoulder to minimal.    Problem List Patient Active Problem List  Diagnosis  . Obesity  . 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 Frozen shoulder syndrome  . Pain in joint, shoulder region  . Muscle weakness (generalized)    End of Session Activity Tolerance: Patient  tolerated treatment well General Behavior During Session: [facility] for tasks performed Cognition: [facility] for tasks performed  GO   Bryon Parker L. Barbie Croston, COTA/L  10/16/2012, 4:36 PM

## 2012-10-21 ENCOUNTER — Ambulatory Visit (HOSPITAL_COMMUNITY)
Admission: RE | Admit: 2012-10-21 | Discharge: 2012-10-21 | Disposition: A | Discharge status: Home / Self Care | Payer: Medicaid Other | Source: Ambulatory Visit | Attending: Orthopedic Surgery | Admitting: Orthopedic Surgery

## 2012-10-21 DIAGNOSIS — M6281 Muscle weakness (generalized): Secondary | ICD-10-CM

## 2012-10-21 DIAGNOSIS — M25519 Pain in unspecified shoulder: Secondary | ICD-10-CM

## 2012-10-21 NOTE — Progress Notes (Signed)
Occupational Therapy Treatment Patient Details  Name: [name] MRN: [id number] Date of Birth: 10-04-1976  Today's Date: 10/21/2012 Time: 7829-5621 OT Time Calculation (min): 74 min Manual Therapy 239-301 22' Therapeutic Exercise 302-334 23' IFES with Moist Heat 335-350 15'   Visit#: 9  of 12   Re-eval: 10/24/12    Authorization: Medicaid  Authorization Time Period: awaiting approved visits from [employer]  Authorization Visit#: 9  of 9   Subjective Symptoms/Limitations Symptoms: S:  I hurts mostly after therapy then it dies down and I don't need meds till I go to bed. Pain Assessment Currently in Pain?: Other (Comment) (no pain because on pain meds for therapy)  Precautions/Restrictions     Exercise/Treatments Supine Protraction: PROM;AAROM;15 reps Horizontal ABduction: PROM;AAROM;15 reps External Rotation: PROM;AAROM;15 reps Internal Rotation: PROM;AAROM;15 reps Flexion: PROM;AAROM;15 reps ABduction: PROM;AAROM;15 reps Seated Elevation: 20 reps;AROM Extension: 20 reps;AROM Row: 20 reps;AROM   Standing External Rotation: Theraband;12 reps Theraband Level (Shoulder External Rotation): Level 2 (Red) Internal Rotation: Theraband;12 reps Theraband Level (Shoulder Internal Rotation): Level 2 (Red) Extension: Theraband;12 reps Theraband Level (Shoulder Extension): Level 2 (Red) Row: Theraband;12 reps Theraband Level (Shoulder Row): Level 2 (Red) Retraction: Theraband;12 reps Theraband Level (Shoulder Retraction): Level 2 (Red) Other Standing Exercises: elbow corner presses to facilitate scapular retraction. Pulleys Flexion: 2 minutes ABduction: 2 minutes Therapy Ball Flexion: 25 reps ABduction: 25 reps Right/Left: 5 reps ROM / Strengthening / Isometric Strengthening Wall Wash: 2' Thumb Tacks: 1' Prot/Ret//Elev/Dep: 1'     Modalities Modalities: Moist Heat;Electrical Stimulation Manual Therapy Manual Therapy: Myofascial release Myofascial  Release: MFR and manual stretching to left shoulder region, upper arm, and scapular region to decrease pain and restrictions and increase AROM and strength to WNL  Moist Heat Therapy Number Minutes Moist Heat: 15 Minutes Moist Heat Location: Shoulder Electrical Stimulation Electrical Stimulation Location: IFES with moist heat (no charge for heat) Electrical Stimulation Action: hi/low sweep  Electrical Stimulation Parameters: to patients comfort level  Electrical Stimulation Goals: Pain  Occupational Therapy Assessment and Plan OT Assessment and Plan Clinical Impression Statement: A: Added wall wash and elbow corner presses. Patient was unable to retract shoulders and squeeze scapula together without assist because of a combination of very weak scapular musclature and tight across clavicle area. OT Plan: P:  Facilitate increased retraction by strengthening scapula stabalizers.   Goals Short Term Goals Time to Complete Short Term Goals: 3 weeks Short Term Goal 1: Patient will be educated on a HEP. Short Term Goal 2: Patient will increase PROM to [facility] for increased I donning a shirt. Short Term Goal 3: Patient will increase strength to 4+/5 for increased ability to pick up her infant. Short Term Goal 4: Patient will decrease pain to 5/10 with activitiy in her left shoulder. Short Term Goal 5: Patient will decrease fascial restrictions to moderate in her left shoulder region.  Long Term Goals Time to Complete Long Term Goals: 6 weeks Long Term Goal 1: Patient will return to her prior level of I with all B/IADLs and leisure activities.   Long Term Goal 2: Patient will increase left shoulder AROM to [facility] for increased ability to reach into overhead cabinets. Long Term Goal 3: Patient will increase left shoulder strength to 5/5 for increased ability to lift laundry baskets. Long Term Goal 4: Patient will decrease pain level to 3/10 in her left shoulder when picking up her son. Long Term Goal 5:  Patient will decrease fascial restrictions in her left shoulder to minimal.  Problem List Patient Active Problem List  Diagnosis  . Obesity  . 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 Frozen shoulder syndrome  . Pain in joint, shoulder region  . Muscle weakness (generalized)    End of Session Activity Tolerance: Patient tolerated treatment well General Behavior During Session: [facility] for tasks performed Cognition: [facility] for tasks performed  GO   Dionne Knoop L. Noralee Stain, COTA/L  10/21/2012, 5:35 PM

## 2012-10-23 ENCOUNTER — Ambulatory Visit (HOSPITAL_COMMUNITY)
Admission: RE | Admit: 2012-10-23 | Discharge: 2012-10-23 | Disposition: A | Discharge status: Home / Self Care | Payer: Medicaid Other | Source: Ambulatory Visit | Attending: Orthopedic Surgery | Admitting: Orthopedic Surgery

## 2012-10-23 DIAGNOSIS — M25519 Pain in unspecified shoulder: Secondary | ICD-10-CM

## 2012-10-23 DIAGNOSIS — M6281 Muscle weakness (generalized): Secondary | ICD-10-CM

## 2012-10-23 NOTE — Progress Notes (Signed)
Occupational Therapy Treatment Patient Details  Name: [name] MRN: [id number] Date of Birth: 1976/05/20  Today's Date: 10/23/2012 Time: 4098-1191 OT Time Calculation (min): 63 min Manual Therapy 147-209 Therapeutic Exercise 210-234 24' Moist heat 235-250 15'  Visit#: 10  of 12   Re-eval: 11/18/12    Authorization: Medicaid  Authorization Time Period: awaiting approved visits from [employer]   Authorization Visit#: 10  of 10   Subjective Symptoms/Limitations Symptoms: S:  I am a little achy, I forgot to take my pain meds. Pain Assessment Currently in Pain?: Yes Pain Score:   5 Pain Location: Shoulder Pain Orientation: Left Pain Type: Acute pain  Precautions/Restrictions     Exercise/Treatments Supine Protraction: PROM;10 reps;AAROM;20 reps Horizontal ABduction: PROM;AAROM;20 reps External Rotation: PROM;AAROM;20 reps Internal Rotation: PROM;AAROM;20 reps Flexion: PROM;AAROM;20 reps ABduction: PROM;AAROM;20 reps Seated Elevation: 20 reps;AROM Extension:  (tband) Row:  (tband) Standing External Rotation: Theraband;12 reps Theraband Level (Shoulder External Rotation): Level 2 (Red) Internal Rotation: Theraband;12 reps Theraband Level (Shoulder Internal Rotation): Level 2 (Red) Extension: Theraband;12 reps Theraband Level (Shoulder Extension): Level 2 (Red) Row: Theraband;12 reps Theraband Level (Shoulder Row): Level 2 (Red) Retraction: Theraband;12 reps Theraband Level (Shoulder Retraction): Level 2 (Red) Pulleys Flexion: 3 minutes ABduction: 3 minutes Therapy Ball Flexion: 25 reps ABduction: 25 reps Right/Left: 5 reps ROM / Strengthening / Isometric Strengthening Wall Wash: 3' Thumb Tacks: 1' Prot/Ret//Elev/Dep: 1'     Modalities Modalities: Moist Heat Manual Therapy Manual Therapy: Myofascial release Myofascial Release: MFR and manual stretching to left shoulder region, upper arm, and scapular region to decrease pain and  restrictions and increase AROM and strength to WNL Moist Heat Therapy Number Minutes Moist Heat: 15 Minutes Moist Heat Location: Shoulder  Occupational Therapy Assessment and Plan OT Assessment and Plan Clinical Impression Statement: A:  Not as much PROM today as previous visit but may be related to patient forgot to take her pain meds before therapy today. OT Plan: P:  Continue to strengthen scapular retraction   Goals Short Term Goals Time to Complete Short Term Goals: 3 weeks Short Term Goal 1: Patient will be educated on a HEP. Short Term Goal 2: Patient will increase PROM to [facility] for increased I donning a shirt. Short Term Goal 3: Patient will increase strength to 4+/5 for increased ability to pick up her infant. Short Term Goal 4: Patient will decrease pain to 5/10 with activitiy in her left shoulder. Short Term Goal 5: Patient will decrease fascial restrictions to moderate in her left shoulder region.  Long Term Goals Time to Complete Long Term Goals: 6 weeks Long Term Goal 1: Patient will return to her prior level of I with all B/IADLs and leisure activities.   Long Term Goal 2: Patient will increase left shoulder AROM to [facility] for increased ability to reach into overhead cabinets. Long Term Goal 3: Patient will increase left shoulder strength to 5/5 for increased ability to lift laundry baskets. Long Term Goal 4: Patient will decrease pain level to 3/10 in her left shoulder when picking up her son. Long Term Goal 5: Patient will decrease fascial restrictions in her left shoulder to minimal.    Problem List Patient Active Problem List  Diagnosis  . Obesity  . 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 Frozen shoulder syndrome  . Pain in joint, shoulder region  . Muscle weakness (generalized)    End of Session Activity Tolerance: Patient tolerated treatment well General Behavior During Session: [facility] for tasks performed Cognition:  WFL for tasks  performed  GO   Morrell Fluke L. Suriah Peragine, COTA/L  10/23/2012, 4:31 PM

## 2012-10-30 ENCOUNTER — Emergency Department (HOSPITAL_COMMUNITY): Payer: Medicaid Other

## 2012-10-30 ENCOUNTER — Encounter (HOSPITAL_COMMUNITY): Payer: Self-pay | Admitting: *Deleted

## 2012-10-30 ENCOUNTER — Emergency Department (HOSPITAL_COMMUNITY)
Admission: EM | Admit: 2012-10-30 | Discharge: 2012-10-30 | Disposition: A | Discharge status: Home / Self Care | Payer: Medicaid Other | Attending: Emergency Medicine | Admitting: Emergency Medicine

## 2012-10-30 DIAGNOSIS — R5381 Other malaise: Secondary | ICD-10-CM | POA: Insufficient documentation

## 2012-10-30 DIAGNOSIS — R209 Unspecified disturbances of skin sensation: Secondary | ICD-10-CM | POA: Insufficient documentation

## 2012-10-30 DIAGNOSIS — E669 Obesity, unspecified: Secondary | ICD-10-CM | POA: Insufficient documentation

## 2012-10-30 DIAGNOSIS — M7500 Adhesive capsulitis of unspecified shoulder: Secondary | ICD-10-CM | POA: Insufficient documentation

## 2012-10-30 DIAGNOSIS — M5412 Radiculopathy, cervical region: Secondary | ICD-10-CM | POA: Insufficient documentation

## 2012-10-30 HISTORY — DX: Adhesive capsulitis of unspecified shoulder: M75.00

## 2012-10-30 HISTORY — DX: Migraine, unspecified, not intractable, without status migrainosus: G43.909

## 2012-10-30 MED ORDER — HYDROMORPHONE HCL PF 1 MG/ML IJ SOLN
1.0000 mg | Freq: Once | INTRAMUSCULAR | Status: AC
  Administered 2012-10-30: 1 mg via INTRAMUSCULAR
  Filled 2012-10-30: qty 1

## 2012-10-30 MED ORDER — OXYCODONE-ACETAMINOPHEN 5-325 MG PO TABS: 1.0000 | ORAL_TABLET | ORAL | Status: AC | PRN

## 2012-10-30 NOTE — ED Provider Notes (Signed)
History     CSN: [id number]  Arrival date & time 10/30/12  1523   First MD Initiated Contact with Patient 10/30/12 1607      Chief Complaint  Patient presents with  . Migraine    (Consider location/radiation/quality/duration/timing/severity/associated sxs/prior treatment) HPI Comments: [name] presents with bilateral neck pain which radiates behind her eyes in association with neck pain and photophobia.  She has a history of frozen shoulder syndrome and is actively receiving physical therapy for this problem, and states her pain tends to be worse the day after PT.  She reports intermittent numbness and weakness in her bilateral arms to her hands.  In fact,  She describes dropping her 53 month old baby 2 weeks ago due to weakness.  She is desirous of an MRI.  She denies weakness or numbness in her arms or legs at this time.  She does take hydrocodone prn which has not been relieving her pain,  Describes was awake all night last night due to pain.  She is breast feeding and therefore is limited in meds she can take.  Also has a history of gastric bypass and is supposed to avoid NSaids.   She denies any known trauma to her neck and shoulder.  The history is provided by the patient.    Past Medical History  Diagnosis Date  . Obesity     gastric Bypass, Roux-en Y  . Migraines   . Frozen shoulder syndrome     Past Surgical History  Procedure Date  . Gastric bypass 04/20/2011    [place] 271lb preop  . Fertility surgery 2009    No abnormalities in female    Family History  Problem Relation Age of Onset  . Diabetes Father   . Hyperlipidemia Father   . Hypertension Father   . Cancer Father     skin cancer  . Depression Mother     lifelong  . Anesthesia problems Neg Hx   . Arthritis      History  Substance Use Topics  . Smoking status: Never Smoker   . Smokeless tobacco: Never Used  . Alcohol Use: No    OB History    Grav Para Term Preterm Abortions  TAB SAB Ect Mult Living   3 3 3  0 0 0 0 0 0 3      Review of Systems  Constitutional: Negative for fever and chills.  HENT: Positive for neck pain. Negative for neck stiffness.   Eyes: Negative for pain and visual disturbance.  Respiratory: Negative for shortness of breath.   Cardiovascular: Negative for chest pain and leg swelling.  Gastrointestinal: Negative for abdominal pain, constipation and abdominal distention.  Genitourinary: Negative for dysuria, urgency, frequency, flank pain and difficulty urinating.  Musculoskeletal: Negative for joint swelling and gait problem.  Skin: Negative for rash.  Neurological: Positive for weakness and numbness.    Allergies  Morphine and related; Nsaids; and Penicillins  Home Medications   Current Outpatient Rx  Name Route Sig Dispense Refill  . VITAMIN B-12 IJ Injection Inject 1 each as directed every 30 (thirty) days. Vitamin B-12 1025mcg/ml Injection    . HYDROCODONE-ACETAMINOPHEN 5-325 MG PO TABS Oral Take 1-2 tablets by mouth every 6 (six) hours as needed. For pain    . PRENATAL MULTIVITAMIN CH Oral Take 1 tablet by mouth daily.    . OXYCODONE-ACETAMINOPHEN 5-325 MG PO TABS Oral Take 1 tablet by mouth every 4 (four) hours as needed for pain. 12  tablet 0  . PANTOPRAZOLE SODIUM 20 MG PO TBEC Oral Take 40 mg by mouth daily as needed. When taking NSAIDS      BP 112/78  Pulse 79  Temp 97.6 F (36.4 C) (Oral)  Resp 16  Ht 5\' 5"  (1.651 m)  Wt 135 lb (61.236 kg)  BMI 22.47 kg/m2  SpO2 99%  LMP 06/29/2012  Physical Exam  Nursing note and vitals reviewed. Constitutional: She appears well-developed and well-nourished.  HENT:  Head: Normocephalic.  Eyes: Conjunctivae normal are normal.  Neck: Normal range of motion. Neck supple.  Cardiovascular: Normal rate and intact distal pulses.        Pedal pulses normal.  Pulmonary/Chest: Effort normal.  Abdominal: Soft. Bowel sounds are normal. She exhibits no distension and no mass.    Musculoskeletal: Normal range of motion. She exhibits no edema.       Cervical back: She exhibits bony tenderness. She exhibits no swelling, no edema, no deformity, no spasm and normal pulse.       Lumbar back: She exhibits no edema.       TTP midline at C7.  Soreness of bilateral shoulder and bilateral neck musculature.   Neurological: She is alert. She has normal strength. She displays no atrophy and no tremor. No sensory deficit. Gait normal.  Reflex Scores:      Bicep reflexes are 1+ on the right side and 1+ on the left side.      Equal grip strength.    Skin: Skin is warm and dry.  Psychiatric: She has a normal mood and affect.    ED Course  Procedures (including critical care time)  Labs Reviewed - No data to display No results found.   1. Cervical radiculopathy       MDM  Spoke with Dr. Estell Harpin who suggests outpt MRI.  Ordered as outpt.  Call placed to MRI - who is available at this time.  Pt discharged and moved to radiology.  Percocet prescribed - pt to f/u with pcp tomorrow afternoon.  No neuro deficit on exam or by history to suggest emergent or surgical presentation.  Also discussed worsened sx that should prompt immediate re-evaluation including distal weakness, bowel/bladder retention/incontinence.              Burgess Amor, PA 10/30/12 1736  Burgess Amor, PA 10/30/12 1737

## 2012-10-30 NOTE — ED Notes (Signed)
Pt states neck pain and migraine began this morning. Denies vomiting.

## 2012-10-30 NOTE — ED Provider Notes (Signed)
Medical screening examination/treatment/procedure(s) were performed by non-physician practitioner and as supervising physician I was immediately available for consultation/collaboration.   Woody Kronberg L Georgiana Spillane, MD 10/30/12 2154 

## 2012-11-05 ENCOUNTER — Ambulatory Visit (HOSPITAL_COMMUNITY)
Admission: RE | Admit: 2012-11-05 | Discharge: 2012-11-05 | Disposition: A | Discharge status: Home / Self Care | Payer: Medicaid Other | Source: Ambulatory Visit | Attending: Orthopedic Surgery | Admitting: Orthopedic Surgery

## 2012-11-05 DIAGNOSIS — IMO0001 Reserved for inherently not codable concepts without codable children: Secondary | ICD-10-CM | POA: Insufficient documentation

## 2012-11-05 DIAGNOSIS — M6281 Muscle weakness (generalized): Secondary | ICD-10-CM

## 2012-11-05 DIAGNOSIS — M25519 Pain in unspecified shoulder: Secondary | ICD-10-CM

## 2012-11-05 DIAGNOSIS — M25619 Stiffness of unspecified shoulder, not elsewhere classified: Secondary | ICD-10-CM | POA: Insufficient documentation

## 2012-11-05 NOTE — Progress Notes (Signed)
Occupational Therapy Treatment Patient Details  Name: [name] MRN: [id number] Date of Birth: 09/29/1976  Today's Date: 11/05/2012 Time: 4098-1191 OT Time Calculation (min): 55 min Manual Therapy 103-131 28' Therapeutic Exercise 132-158 26'  Visit#: 11  of 12   Re-eval: 11/18/12    Authorization: Medicaid  Authorization Time Period: authorized cone discount 100%  Authorization Visit#:   of    Subjective Symptoms/Limitations Symptoms: S:  I finally got an appointment for my right shoulder. Pain Assessment Currently in Pain?: Yes Pain Score:   7 Pain Location: Shoulder Pain Orientation: Left Pain Type: Acute pain  Precautions/Restrictions     Exercise/Treatments Supine Protraction: PROM;10 reps;AAROM;20 reps (with 2# dowel) Horizontal ABduction: PROM;AAROM;20 reps (with 2# dowel) External Rotation: PROM;AAROM;20 reps (with 2# dowel) Internal Rotation: PROM;AAROM;20 reps (with 2# dowel) Flexion: PROM;AAROM;20 reps (with 2# dowel) ABduction: PROM;AAROM;20 reps (with 2# dowel) Standing External Rotation: Theraband;12 reps Theraband Level (Shoulder External Rotation): Level 2 (Red) Internal Rotation: Theraband;12 reps Theraband Level (Shoulder Internal Rotation): Level 2 (Red) Extension: Theraband;12 reps Theraband Level (Shoulder Extension): Level 2 (Red) Row: Theraband;12 reps Theraband Level (Shoulder Row): Level 2 (Red) Retraction: Theraband;12 reps Theraband Level (Shoulder Retraction): Level 2 (Red) Pulleys Flexion: 3 minutes ABduction: 3 minutes Therapy Ball Flexion: 25 reps ABduction: 25 reps Right/Left: 5 reps ROM / Strengthening / Isometric Strengthening Wall Wash: 4' Thumb Tacks: 1' Prot/Ret//Elev/Dep: 1'      Manual Therapy Manual Therapy: Myofascial release Myofascial Release: MFR and manual stretching to left shoulder region, upper arm, and scapular region to decrease pain and restrictions and increase AROM and strength to WNL    Occupational Therapy Assessment and Plan OT Assessment and Plan Clinical Impression Statement: A:  Added posterior mob with towel under shoulder blade.  Tolerated increase in PROM today but patient stated she did take pain meds today prior to therapy . OT Plan: P:  Continue with mobilizations to free up scapula for increase in PROM   Goals Short Term Goals Time to Complete Short Term Goals: 3 weeks Short Term Goal 1: Patient will be educated on a HEP. Short Term Goal 2: Patient will increase PROM to [facility] for increased I donning a shirt. Short Term Goal 3: Patient will increase strength to 4+/5 for increased ability to pick up her infant. Short Term Goal 4: Patient will decrease pain to 5/10 with activitiy in her left shoulder. Short Term Goal 5: Patient will decrease fascial restrictions to moderate in her left shoulder region.  Long Term Goals Time to Complete Long Term Goals: 6 weeks Long Term Goal 1: Patient will return to her prior level of I with all B/IADLs and leisure activities.   Long Term Goal 2: Patient will increase left shoulder AROM to [facility] for increased ability to reach into overhead cabinets. Long Term Goal 3: Patient will increase left shoulder strength to 5/5 for increased ability to lift laundry baskets. Long Term Goal 4: Patient will decrease pain level to 3/10 in her left shoulder when picking up her son. Long Term Goal 5: Patient will decrease fascial restrictions in her left shoulder to minimal.    Problem List Patient Active Problem List  Diagnosis  . Obesity  . 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 Frozen shoulder syndrome  . Pain in joint, shoulder region  . Muscle weakness (generalized)    End of Session Activity Tolerance: Patient tolerated treatment well General Behavior During Session: [facility] for tasks performed Cognition: [facility] for tasks performed  GO  Noralee Stain, Ronon Ferger L 11/05/2012, 4:00 PM

## 2012-11-06 ENCOUNTER — Ambulatory Visit (HOSPITAL_COMMUNITY)
Admission: RE | Admit: 2012-11-06 | Discharge: 2012-11-06 | Disposition: A | Discharge status: Home / Self Care | Payer: Medicaid Other | Source: Ambulatory Visit | Attending: Orthopedic Surgery | Admitting: Orthopedic Surgery

## 2012-11-06 DIAGNOSIS — M6281 Muscle weakness (generalized): Secondary | ICD-10-CM

## 2012-11-06 DIAGNOSIS — M25519 Pain in unspecified shoulder: Secondary | ICD-10-CM

## 2012-11-06 NOTE — Progress Notes (Signed)
Occupational Therapy Treatment Patient Details  Name: [name] MRN: [id number] Date of Birth: 03/30/1976  Today's Date: 11/06/2012 Time: 1525-1610 OT Time Calculation (min): 45 min Manual Therapy 30'  Visit#: 12  of 16   Re-eval: 11/18/12     Authorization Time Period: authorized [employer] discount 100%   Subjective Symptoms/Limitations Symptoms: S:  I am going to Dr. Romeo Apple regarding my right shoulder on Tuesday. Pain Assessment Currently in Pain?: Yes Pain Score:   7 Pain Location: Shoulder Pain Orientation: Right;Left Pain Type: Acute pain  Precautions/Restrictions     Exercise/Treatments Supine Protraction: PROM;Both;AAROM;10 reps Horizontal ABduction: PROM;AAROM;10 reps;Both External Rotation: PROM;AAROM;10 reps;Both Internal Rotation: PROM;AAROM;10 reps;Both Flexion: PROM;AAROM;10 reps;Both ABduction: PROM;AAROM;10 reps;Both    Manual Therapy Myofascial Release: MFR and manual stretching to left shoulder region, upper arm, and scapular region and associated areas, cervical region, and SCM to decrease pain and restrictions and increase pain free mobility.    Occupational Therapy Assessment and Plan OT Assessment and Plan Clinical Impression Statement: A:  Discussed shoulder mechanics, posture, at length today.  Added scapular strengthening and postural exercises to HEP.   OT Plan: P:  Focus on postural control and scapular strengthening in order for shoulder alignment to improve and experience less pain with daily activities.   Goals Short Term Goals Time to Complete Short Term Goals: 3 weeks Short Term Goal 1: Patient will be educated on a HEP. Short Term Goal 1 Progress: Progressing toward goal Short Term Goal 2: Patient will increase PROM to [facility] for increased I donning a shirt. Short Term Goal 2 Progress: Progressing toward goal Short Term Goal 3: Patient will increase strength to 4+/5 for increased ability to pick up her infant. Short Term Goal 3  Progress: Progressing toward goal Short Term Goal 4: Patient will decrease pain to 5/10 with activitiy in her left shoulder. Short Term Goal 4 Progress: Progressing toward goal Short Term Goal 5: Patient will decrease fascial restrictions to moderate in her left shoulder region.  Short Term Goal 5 Progress: Progressing toward goal Long Term Goals Time to Complete Long Term Goals: 6 weeks Long Term Goal 1: Patient will return to her prior level of I with all B/IADLs and leisure activities.   Long Term Goal 1 Progress: Progressing toward goal Long Term Goal 2: Patient will increase left shoulder AROM to [facility] for increased ability to reach into overhead cabinets. Long Term Goal 2 Progress: Progressing toward goal Long Term Goal 3: Patient will increase left shoulder strength to 5/5 for increased ability to lift laundry baskets. Long Term Goal 3 Progress: Progressing toward goal Long Term Goal 4: Patient will decrease pain level to 3/10 in her left shoulder when picking up her son. Long Term Goal 4 Progress: Progressing toward goal Long Term Goal 5: Patient will decrease fascial restrictions in her left shoulder to minimal.   Long Term Goal 5 Progress: Progressing toward goal  Problem List Patient Active Problem List  Diagnosis  . Obesity  . 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 Frozen shoulder syndrome  . Pain in joint, shoulder region  . Muscle weakness (generalized)    End of Session Activity Tolerance: Patient tolerated treatment well General Behavior During Session: [facility] for tasks performed Cognition: [facility] for tasks performed OT Plan of Care OT Home Exercise Plan: tband for scapular strengthening and posture  GO    Shirlean Mylar, OTR/L  11/06/2012, 4:53 PM

## 2012-11-14 ENCOUNTER — Ambulatory Visit (HOSPITAL_COMMUNITY)
Admission: RE | Admit: 2012-11-14 | Discharge: 2012-11-14 | Disposition: A | Discharge status: Home / Self Care | Payer: Medicaid Other | Source: Ambulatory Visit | Attending: Orthopedic Surgery | Admitting: Orthopedic Surgery

## 2012-11-14 DIAGNOSIS — M25519 Pain in unspecified shoulder: Secondary | ICD-10-CM

## 2012-11-14 DIAGNOSIS — M6281 Muscle weakness (generalized): Secondary | ICD-10-CM

## 2012-11-18 ENCOUNTER — Encounter: Payer: Self-pay | Admitting: Orthopedic Surgery

## 2012-11-18 ENCOUNTER — Ambulatory Visit (INDEPENDENT_AMBULATORY_CARE_PROVIDER_SITE_OTHER): Payer: Medicaid Other | Admitting: Orthopedic Surgery

## 2012-11-18 VITALS — Ht 65.0 in | Wt 135.0 lb

## 2012-11-18 DIAGNOSIS — M25519 Pain in unspecified shoulder: Secondary | ICD-10-CM

## 2012-11-18 MED ORDER — NABUMETONE 500 MG PO TABS: 500.0000 mg | ORAL_TABLET | Freq: Two times a day (BID) | ORAL | Status: DC

## 2012-11-18 NOTE — Progress Notes (Signed)
Occupational Therapy Treatment Patient Details  Name: [name] MRN: [id number] Date of Birth: 03/30/1976  Today's Date: 11/18/2012 Time: 4098-1191 Manual Therapy 107-136 29'  Therapeutic Exercise 137-210 34  Visit#:13   of   16 Re-eval: 11/18/12    Authorization: Medicaid  Authorization Time Period: authorized cone discount 100%  Authorization Visit#:   of    Subjective Symptoms/Limitations Symptoms: S:  Both shoulders hurt today but more my left. Pain Assessment Currently in Pain?: Yes Pain Score:   6 Pain Location: Shoulder Pain Orientation: Right;Left Pain Type: Acute pain  Precautions/Restrictions  Precautions Precautions: None  Exercise/Treatments   11/14/12 1300 Shoulder Exercises: Supine Protraction PROM;Both;AAROM;12 reps Horizontal ABduction PROM;AAROM;Both;12 reps External Rotation PROM;AAROM;Both;12 reps Internal Rotation PROM;AAROM;Both;12 reps Flexion PROM;AAROM;Both;12 reps ABduction PROM;AAROM;Both;12 reps Shoulder Exercises: Seated Elevation 20 reps;AROM Shoulder Exercises: Pulleys Flexion 3 minutes ABduction 3 minutes Shoulder Exercises: Therapy Ball Flexion 25 reps ABduction 25 reps Right/Left 5 reps Shoulder Exercises: ROM/Strengthening Wall Wash 4' Thumb Tacks 1' Prot/Ret//Elev/Dep 1'            11/14/12 0907  Manual Therapy  Manual Therapy Myofascial release  Myofascial Release MFR and manual stretching to left shoulder region, upper arm, and scapular region and associated areas, cervical region, and SCM to decrease pain and restrictions and increase pain free mobility.    Occupational Therapy Assessment and Plan    A: Began MFR and ther-ex to right arm per new MD orders. Patient tolerated all new exercise well.   P: Increase supine and seated reps and continue with posterior cap stretch.  Problem List Patient Active Problem List  Diagnosis  . Obesity  . Pregnancy complicated by previous gastric bypass,  antepartum  . Frozen shoulder syndrome  . Pain in joint, shoulder region  . Muscle weakness (generalized)  . Shoulder pain       GO    Noralee Stain, Luwanna Brossman L 11/18/2012, 6:40 PM

## 2012-11-18 NOTE — Progress Notes (Signed)
Patient ID: [name], female   DOB: 16-Apr-1976, 36 y.o.   MRN: [id number] Chief Complaint  Patient presents with  . Shoulder Pain    Right shoulder pain, no injury.    New complaint of RIGHT shoulder pain, stiffness, and referral from primary care for evaluation of frozen shoulder tear. Patient currently has been evaluated for LEFT frozen shoulder and is in physical therapy with improvement in her range of motion.  She had an MRI of the LEFT shoulder, which showed capsular thickening, with no tear.  RIGHT shoulder. No trauma. RIGHT shoulder aching, throbbing, shoulder pain with decreased range of motion and painful forward elevation.  The patient has had gastric bypass surgery and recent pregnancy with delivery and is breast-feeding and does not want to take any oral medications that might interfere.  She does note that she can take anti-inflammatories with a proton pump inhibitor. She started a pro time pump inhibitor. Yesterday.  She has gone to the emergency room for RIGHT shoulder pain.  Past Medical History  Diagnosis Date  . Obesity     gastric Bypass, Roux-en Y  . Migraines   . Frozen shoulder syndrome     Past Surgical History  Procedure Date  . Gastric bypass 04/20/2011    [place], wt 271lb preop  . Fertility surgery 2009    No abnormalities in female    Review of systems is notable for tingling and tremors. She was evaluated with a cervical MRI, which was normal.  Vital signs are stable as recorded  General appearance is normal  The patient is alert and oriented x3  The patient's mood and affect are normal  Gait assessment: Normal The cardiovascular exam reveals normal pulses and temperature without edema swelling.  The lymphatic system is negative for palpable lymph nodes  The sensory exam is normal.  There are no pathologic reflexes.  Balance is normal.   Exam of the RIGHT shoulder shows that she does not have frozen shoulder. She  has normal passive forward elevation in terms of overall range. She has normal range in terms of external rotation and abduction. She does have some decreased internal rotation, consistent with impingement syndrome.  LEFT shoulder reveals normal passive range of motion except for a painful arc of motion. She does have decreased internal rotation.  Both shoulders are stable.  No swelling in either shoulder.  Skin normal. Both shoulders. There is some weakness in the rotator cuff, but not consistent with tearing.  Recommend bilateral shoulder injections.  Start Relafen 500 mg twice a day.  There's really not much I can do for her at this point. This needs to be worked Out in therapy.  Continue physical therapy. Follow up as needed, start Relafen 500 twice a day.  Subacromial Shoulder Injection Procedure Note  Pre-operative Diagnosis: left RC Syndrome  Post-operative Diagnosis: same  Indications: pain   Anesthesia: ethyl chloride   Procedure Details   Verbal consent was obtained for the procedure. The shoulder was prepped withalcohol and the skin was anesthetized. A 20 gauge needle was advanced into the subacromial space through posterior approach without difficulty  The space was then injected with 3 ml 1% lidocaine and 1 ml of depomedrol. The injection site was cleansed with isopropyl alcohol and a dressing was applied.  Complications:  None; patient tolerated the procedure well.  Shoulder Injection Procedure Note   Pre-operative Diagnosis: right  RC Syndrome  Post-operative Diagnosis: same  Indications: pain   Anesthesia: ethyl chloride  Procedure Details   Verbal consent was obtained for the procedure. The shoulder was prepped withalcohol and the skin was anesthetized. A 20 gauge needle was advanced into the subacromial space through posterior approach without difficulty  The space was then injected with 3 ml 1% lidocaine and 1 ml of depomedrol. The injection site was  cleansed with isopropyl alcohol and a dressing was applied.  Complications:  None; patient tolerated the procedure well.

## 2012-11-18 NOTE — Patient Instructions (Addendum)
Continue physical therapy   Start relafen 500 mg twice a day with protonix  You have received a steroid shot. 15% of patients experience increased pain at the injection site with in the next 24 hours. This is best treated with ice and tylenol extra strength 2 tabs every 8 hours. If you are still having pain please call the office.

## 2012-11-19 ENCOUNTER — Ambulatory Visit (HOSPITAL_COMMUNITY)
Admission: RE | Admit: 2012-11-19 | Discharge: 2012-11-19 | Disposition: A | Discharge status: Home / Self Care | Payer: Medicaid Other | Source: Ambulatory Visit | Attending: Orthopedic Surgery | Admitting: Orthopedic Surgery

## 2012-11-19 DIAGNOSIS — M6281 Muscle weakness (generalized): Secondary | ICD-10-CM

## 2012-11-19 DIAGNOSIS — M25519 Pain in unspecified shoulder: Secondary | ICD-10-CM

## 2012-11-19 NOTE — Progress Notes (Signed)
Occupational Therapy Treatment Patient Details  Name: [name] MRN: [id number] Date of Birth: [birth year]  Today's Date: 11/19/2012 Time: 1433-1600 OT Time Calculation (min): 87 min Manual therapy 233-301 28' Reassess 302-312 10' Therapeutic Exercise 313-344 73' IFES with MH [phone] 15'  Visit#: 14  of 16   Re-eval: 12/17/12    Authorization: medicaid  Authorization Time Period: authorized cone discount 100%  Authorization Visit#: 10  of 16   Subjective Symptoms/Limitations Symptoms: S:  I didn't sleep at all last night my arm hurt so much it kept me up all night Pain Assessment Currently in Pain?: Yes Pain Score:   6 Pain Location: Shoulder Pain Orientation: Right;Left Pain Type: Acute pain Pain Onset: More than a month ago  Exercise/Treatments Supine Protraction: PROM;Both;10 reps Horizontal ABduction: PROM;Both;10 reps External Rotation: PROM;Both;10 reps Internal Rotation: PROM;Both;10 reps Flexion: PROM;Both;10 reps ABduction: PROM;Both;10 reps Pulleys Flexion: 3 minutes ABduction: 3 minutes Therapy Ball Flexion: 25 reps ABduction: 25 reps Right/Left: 5 reps ROM / Strengthening / Isometric Strengthening Wall Wash: 4' bilateral Thumb Tacks: 1' Prot/Ret//Elev/Dep: 1'       Modalities Modalities: Moist Heat;Electrical Stimulation Manual Therapy Myofascial Release: MFR and manual stretching to right & left shoulder region, upper arm, and scapular region and associated areas, cervical region, and SCM to decrease pain and restrictions and increase pain free mobility. Moist Heat Therapy Number Minutes Moist Heat: 15 Minutes Moist Heat Location: Shoulder;Other (comment) (left) Museum/gallery exhibitions officer Stimulation Location: IFES with moist heat (no charge for heat) Electrical Stimulation Action: hi/low sweep  Electrical Stimulation Parameters: to patients comfort level  Electrical Stimulation Goals: Pain  Occupational Therapy  Assessment and Plan OT Assessment and Plan Clinical Impression Statement: A:  See progress note.  Secondary to no progress with the left we will d/c therapy to the left but continue with the right.  Will continue with the right 2 x a week for 4 weeks . OT Plan: P:  Continue 2x a week for 4 weeks.  Resume supine and seated AAROM.   Goals Short Term Goals Time to Complete Short Term Goals: 3 weeks Short Term Goal 1: Patient will be educated on a HEP. Short Term Goal 1 Progress: Met Short Term Goal 2: Patient will increase PROM to [facility] for increased I donning a shirt. Short Term Goal 3: Patient will increase strength to 4+/5 for increased ability to pick up her infant. Short Term Goal 4: Patient will decrease pain to 5/10 with activitiy in her right shoulder. Short Term Goal 5: Patient will decrease fascial restrictions to moderate in her right shoulder region.  Long Term Goals Time to Complete Long Term Goals: 6 weeks Long Term Goal 1: Patient will return to her prior level of I with all B/IADLs and leisure activities.   Long Term Goal 2: Patient will increase right shoulder AROM to [facility] for increased ability to reach into overhead cabinets. Long Term Goal 3: Patient will increase right shoulder strength to 5/5 for increased ability to lift laundry baskets. Long Term Goal 4: Patient will decrease pain level to 3/10 in her right shoulder when picking up her son. Long Term Goal 5: Patient will decrease fascial restrictions in her right shoulder to minimal.    Problem List Patient Active Problem List  Diagnosis  . Obesity  . 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 Frozen shoulder syndrome  . Pain in joint, shoulder region  . Muscle weakness (generalized)  . Shoulder pain    End of  Session Activity Tolerance: Patient tolerated treatment well General Behavior During Session: [facility] for tasks performed Cognition: [facility] for tasks performed  GO    [name], Darlene Gaudin  Bernard 11/19/2012, 3:52 PM

## 2012-11-20 ENCOUNTER — Ambulatory Visit (HOSPITAL_COMMUNITY)
Admission: RE | Admit: 2012-11-20 | Discharge: 2012-11-20 | Disposition: A | Discharge status: Home / Self Care | Payer: Medicaid Other | Source: Ambulatory Visit | Attending: Orthopedic Surgery | Admitting: Orthopedic Surgery

## 2012-11-20 DIAGNOSIS — M25519 Pain in unspecified shoulder: Secondary | ICD-10-CM

## 2012-11-20 DIAGNOSIS — M6281 Muscle weakness (generalized): Secondary | ICD-10-CM

## 2012-11-20 NOTE — Progress Notes (Signed)
Occupational Therapy Treatment Patient Details  Name: [name] MRN: [id number] Date of Birth: 1976-06-07  Today's Date: 11/20/2012 Time: 4098-1191 OT Time Calculation (min): 49 min Massage 1302-1326 24' Therex 4782-9562 25'  Visit#: 3  of 10   Re-eval: 12/17/12    Authorization: medicaid  Authorization Time Period: authorized cone discount 100%   Authorization Visit#: 3  of 10   Subjective Symptoms/Limitations Symptoms: S: My shoulder doesn't hurt too bad today. Pain Assessment Currently in Pain?: Yes Pain Score:   5 Pain Location: Shoulder Pain Orientation: Right Pain Type: Acute pain  Precautions/Restrictions  Precautions Precautions: None  Exercise/Treatments Supine Protraction: PROM;Both;10 reps;AAROM Horizontal ABduction: PROM;Both;10 reps;AAROM External Rotation: PROM;Both;10 reps;AAROM Internal Rotation: PROM;Both;10 reps;AAROM Flexion: PROM;Both;10 reps;AAROM ABduction: PROM;Both;10 reps;AAROM Seated Elevation: 20 reps;AROM Pulleys Flexion: 3 minutes ABduction: 3 minutes Therapy Ball Flexion: 25 reps ABduction: 25 reps Right/Left: 5 reps     Manual Therapy Manual Therapy: Massage Massage: Massage and manual stretching to right & left shoulder region, upper arm, and scapular region and associated areas, cervical region.  Occupational Therapy Assessment and Plan OT Assessment and Plan Clinical Impression Statement: A: Still experiencing limited joint mobility with internal and external rotation. Was able to put right hand on hip with stretching and muscle manipulation and patient was pleased.. OT Plan: P: Resume exercises for right arm.   Goals Short Term Goals Time to Complete Short Term Goals: 3 weeks Short Term Goal 1: Patient will be educated on a HEP. Short Term Goal 2: Patient will increase PROM to [facility] for increased I donning a shirt. Short Term Goal 2 Progress: Progressing toward goal Short Term Goal 3: Patient will increase  strength to 4+/5 for increased ability to pick up her infant. Short Term Goal 3 Progress: Progressing toward goal Short Term Goal 4: Patient will decrease pain to 5/10 with activitiy in her right shoulder. Short Term Goal 4 Progress: Progressing toward goal Short Term Goal 5: Patient will decrease fascial restrictions to moderate in her right shoulder region.  Short Term Goal 5 Progress: Progressing toward goal Long Term Goals Time to Complete Long Term Goals: 6 weeks Long Term Goal 1: Patient will return to her prior level of I with all B/IADLs and leisure activities.   Long Term Goal 1 Progress: Progressing toward goal Long Term Goal 2: Patient will increase right shoulder AROM to [facility] for increased ability to reach into overhead cabinets. Long Term Goal 2 Progress: Progressing toward goal Long Term Goal 3: Patient will increase right shoulder strength to 5/5 for increased ability to lift laundry baskets. Long Term Goal 3 Progress: Progressing toward goal Long Term Goal 4: Patient will decrease pain level to 3/10 in her right shoulder when picking up her son. Long Term Goal 4 Progress: Progressing toward goal Long Term Goal 5: Patient will decrease fascial restrictions in her right shoulder to minimal.   Long Term Goal 5 Progress: Progressing toward goal  Problem List Patient Active Problem List  Diagnosis  . Obesity  . 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 Frozen shoulder syndrome  . Pain in joint, shoulder region  . Muscle weakness (generalized)  . Shoulder pain    End of Session Activity Tolerance: Patient tolerated treatment well;Patient limited by pain General Behavior During Session: [facility] for tasks performed Cognition: [facility] for tasks performed   Limmie Patricia, OTR/L 11/20/2012, 1:57 PM

## 2012-11-26 ENCOUNTER — Ambulatory Visit (HOSPITAL_COMMUNITY)
Admission: RE | Admit: 2012-11-26 | Discharge: 2012-11-26 | Disposition: A | Discharge status: Home / Self Care | Payer: Medicaid Other | Source: Ambulatory Visit | Attending: Orthopedic Surgery | Admitting: Orthopedic Surgery

## 2012-11-26 ENCOUNTER — Ambulatory Visit: Payer: Medicaid Other | Admitting: Orthopedic Surgery

## 2012-11-26 DIAGNOSIS — IMO0001 Reserved for inherently not codable concepts without codable children: Secondary | ICD-10-CM | POA: Insufficient documentation

## 2012-11-26 DIAGNOSIS — M25619 Stiffness of unspecified shoulder, not elsewhere classified: Secondary | ICD-10-CM | POA: Insufficient documentation

## 2012-11-26 DIAGNOSIS — M25519 Pain in unspecified shoulder: Secondary | ICD-10-CM | POA: Insufficient documentation

## 2012-11-26 DIAGNOSIS — M6281 Muscle weakness (generalized): Secondary | ICD-10-CM | POA: Insufficient documentation

## 2012-11-26 NOTE — Progress Notes (Signed)
Occupational Therapy Treatment Patient Details  Name: DEIRDRE GRYDER MRN: [id number] Date of Birth: June 30, 1976  Today's Date: 11/26/2012 Time: 4098-1191 OT Time Calculation (min): 24 min Manual Therapy 24'  Visit#: 4  of 10   Re-eval: 12/17/12 Assessment Diagnosis: Right Shoulder Adhesive Capsulitis  Authorization: medicaid  Authorization Time Period: authorized cone discount 100%  Authorization Visit#: 4  of 10   Subjective Symptoms/Limitations Symptoms: S:  I have a house full of people I have to get back to, can we skip the exercises. Pain Assessment Currently in Pain?: Yes Pain Score:   2 Pain Location: Shoulder Pain Orientation: Right Pain Type: Acute pain  Precautions/Restrictions     Exercise/Treatments Supine Protraction: PROM;10 reps;Right Horizontal ABduction: PROM;10 reps;Right External Rotation: PROM;10 reps;Right Internal Rotation: PROM;10 reps;Right Flexion: PROM;10 reps;Right ABduction: PROM;10 reps;Right       Manual Therapy Manual Therapy: Myofascial release Myofascial Release: MFR and manual stretching to right shoulder region, upper arm, and scapular region and associated areas, cervical region, and SCM to decrease pain and restrictions and increase pain free mobility  Occupational Therapy Assessment and Plan OT Assessment and Plan Clinical Impression Statement: A:  Hold on exercises today per patient request, she has a lot of family from out of town at her house.  Good PROM today to right shoulder.  No pain after manual just fatigue. Rehab Potential: Good OT Plan: P:  Resume exercises missed this session.   Goals Short Term Goals Time to Complete Short Term Goals: 3 weeks Short Term Goal 1: Patient will be educated on a HEP. Short Term Goal 2: Patient will increase PROM to [facility] for increased I donning a shirt. Short Term Goal 3: Patient will increase strength to 4+/5 for increased ability to pick up her infant. Short Term Goal 4:  Patient will decrease pain to 5/10 with activitiy in her right shoulder. Short Term Goal 5: Patient will decrease fascial restrictions to moderate in her right shoulder region.  Long Term Goals Time to Complete Long Term Goals: 6 weeks Long Term Goal 1: Patient will return to her prior level of I with all B/IADLs and leisure activities.   Long Term Goal 2: Patient will increase right shoulder AROM to [facility] for increased ability to reach into overhead cabinets. Long Term Goal 3: Patient will increase right shoulder strength to 5/5 for increased ability to lift laundry baskets. Long Term Goal 4: Patient will decrease pain level to 3/10 in her right shoulder when picking up her son. Long Term Goal 5: Patient will decrease fascial restrictions in her right shoulder to minimal.    Problem List Patient Active Problem List  Diagnosis  . Obesity  . 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 Frozen shoulder syndrome  . Pain in joint, shoulder region  . Muscle weakness (generalized)  . Shoulder pain    End of Session Activity Tolerance: Patient tolerated treatment well General Behavior During Session: [facility] for tasks performed Cognition: [facility] for tasks performed  GO    Noralee Stain, Helen Cuff L 11/26/2012, 5:34 PM

## 2012-12-03 ENCOUNTER — Ambulatory Visit (HOSPITAL_COMMUNITY)
Admission: RE | Admit: 2012-12-03 | Discharge: 2012-12-03 | Disposition: A | Discharge status: Home / Self Care | Payer: Medicaid Other | Source: Ambulatory Visit | Attending: Orthopedic Surgery | Admitting: Orthopedic Surgery

## 2012-12-03 DIAGNOSIS — M25619 Stiffness of unspecified shoulder, not elsewhere classified: Secondary | ICD-10-CM | POA: Insufficient documentation

## 2012-12-03 DIAGNOSIS — M25519 Pain in unspecified shoulder: Secondary | ICD-10-CM | POA: Insufficient documentation

## 2012-12-03 DIAGNOSIS — M6281 Muscle weakness (generalized): Secondary | ICD-10-CM

## 2012-12-03 DIAGNOSIS — IMO0001 Reserved for inherently not codable concepts without codable children: Secondary | ICD-10-CM | POA: Insufficient documentation

## 2012-12-03 NOTE — Progress Notes (Signed)
Occupational Therapy Treatment Patient Details  Name: [name] MRN: [id number] Date of Birth: [birth year]  Today's Date: 12/03/2012 Time: 6295-2841 OT Time Calculation (min): 46 min Manual Therapy 101-119 18' Therapeutic Exercise 120-147 27'  Visit#: 5  of 10   Re-eval: 12/17/12    Authorization: medicaid  Authorization Time Period: authorized cone discount 100%  Authorization Visit#: 5  of 10   Subjective Symptoms/Limitations Symptoms: S:  I have had a good week, there hasn't been any pain except when I wake up in the morning. Pain Assessment Currently in Pain?: No/denies Pain Score: 0-No pain  Precautions/Restrictions     Exercise/Treatments Supine Protraction: PROM;AAROM;15 reps Horizontal ABduction: PROM;AAROM;10 reps External Rotation: PROM;AAROM;10 reps Internal Rotation: PROM;AAROM;10 reps Flexion: PROM;AAROM;10 reps ABduction: PROM;AAROM;10 reps Seated Protraction: AAROM;10 reps Horizontal ABduction: AAROM;10 reps External Rotation: AAROM;10 reps;Other (comment) (with towel under arm to keep in correct position.) Internal Rotation: AAROM;10 reps Flexion: AAROM;10 reps Abduction: AAROM;10 reps Standing External Rotation: Theraband;12 reps Theraband Level (Shoulder External Rotation): Level 2 (Red) Internal Rotation: Theraband;12 reps Theraband Level (Shoulder Internal Rotation): Level 2 (Red) Extension: Theraband;12 reps Theraband Level (Shoulder Extension): Level 2 (Red) Row: Theraband;12 reps Theraband Level (Shoulder Row): Level 2 (Red) Retraction: Theraband;12 reps Theraband Level (Shoulder Retraction): Level 2 (Red) Pulleys Flexion: 3 minutes ABduction: 3 minutes Therapy Ball Flexion: 25 reps ABduction: 25 reps Right/Left: 5 reps ROM / Strengthening / Isometric Strengthening Wall Wash: 4' right only Thumb Tacks: 1'     Manual Therapy Manual Therapy: Myofascial release Myofascial Release: MFR and manual stretching to right  shoulder region, upper arm, and scapular region and associated areas, cervical region, and SCM to decrease pain and restrictions and increase pain free mobility   Occupational Therapy Assessment and Plan OT Assessment and Plan Clinical Impression Statement: A; Resumed exercises.  Added AAROM in seated.  ROM today seated right flexion 136, abduction 132, ER 55 OT Plan: P:  Change to AROM in supine to further increase ROM of RUE   Goals Short Term Goals Time to Complete Short Term Goals: 3 weeks Short Term Goal 1: Patient will be educated on a HEP. Short Term Goal 2: Patient will increase PROM to [facility] for increased I donning a shirt. Short Term Goal 3: Patient will increase strength to 4+/5 for increased ability to pick up her infant. Short Term Goal 4: Patient will decrease pain to 5/10 with activitiy in her right shoulder. Short Term Goal 5: Patient will decrease fascial restrictions to moderate in her right shoulder region.  Long Term Goals Time to Complete Long Term Goals: 6 weeks Long Term Goal 1: Patient will return to her prior level of I with all B/IADLs and leisure activities.   Long Term Goal 2: Patient will increase right shoulder AROM to [facility] for increased ability to reach into overhead cabinets. Long Term Goal 3: Patient will increase right shoulder strength to 5/5 for increased ability to lift laundry baskets. Long Term Goal 4: Patient will decrease pain level to 3/10 in her right shoulder when picking up her son. Long Term Goal 5: Patient will decrease fascial restrictions in her right shoulder to minimal.    Problem List Patient Active Problem List  Diagnosis  . Obesity  . 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 Frozen shoulder syndrome  . Pain in joint, shoulder region  . Muscle weakness (generalized)  . Shoulder pain    End of Session Activity Tolerance: Patient tolerated treatment well General Behavior During Session: [facility]  for tasks  performed Cognition: [facility] for tasks performed  GO   Yvonna Brun L. Noralee Stain, COTA/L  12/03/2012, 2:08 PM

## 2012-12-11 ENCOUNTER — Ambulatory Visit (HOSPITAL_COMMUNITY)
Admission: RE | Admit: 2012-12-11 | Discharge: 2012-12-11 | Disposition: A | Discharge status: Home / Self Care | Payer: Medicaid Other | Source: Ambulatory Visit | Attending: Orthopedic Surgery | Admitting: Orthopedic Surgery

## 2012-12-11 DIAGNOSIS — M6281 Muscle weakness (generalized): Secondary | ICD-10-CM

## 2012-12-11 DIAGNOSIS — M25519 Pain in unspecified shoulder: Secondary | ICD-10-CM

## 2012-12-11 NOTE — Progress Notes (Signed)
Occupational Therapy Treatment Patient Details  Name: [name] MRN: [id number] Date of Birth: [birth year]  Today's Date: 12/11/2012 Time: 4098-1191 OT Time Calculation (min): 37 min Manual Therapy 4782-9562 27' Heat 10' Visit#: 6  of 10   Re-eval: 12/17/12    Authorization: medicaid  Authorization Time Period: authorized cone discount 100%  Authorization Visit#: 6  of 10   Subjective S:  My right shoulder is feeling pretty good.  However, I have a migrane today. Pain Assessment Currently in Pain?: Yes Pain Score:   7 Pain Location: Head  Precautions/Restrictions   progress as tolerated  Exercise/Treatments    Manual Therapy Manual Therapy: Myofascial release Myofascial Release: MFR to right shoulder regionm, scapular region, SCM, trapezius, suboccipital release to decrease pain and restrictions in right shoulder region and associated areas. Moist Heat Therapy Number Minutes Moist Heat: 10 Minutes Moist Heat Location: Other (comment) (cervical and right shoulder)  Occupational Therapy Assessment and Plan OT Assessment and Plan Clinical Impression Statement: A:  Focused treatment on relieving pain in her right shoulder and associated cervical region that is causing migrane.  After MFR pain decreased to 4/10 from 7/10.  Omitted exercises this date in order to avoid pain level increasing from 4/10.   OT Plan: P:  Resume exercises, add AROM in supine and seated.  Reassess.   Goals Short Term Goals Time to Complete Short Term Goals: 3 weeks Short Term Goal 1: Patient will be educated on a HEP. Short Term Goal 1 Progress: Progressing toward goal Short Term Goal 2: Patient will increase PROM to [facility] for increased I donning a shirt. Short Term Goal 2 Progress: Progressing toward goal Short Term Goal 3: Patient will increase strength to 4+/5 for increased ability to pick up her infant. Short Term Goal 3 Progress: Progressing toward goal Short Term Goal 4: Patient  will decrease pain to 5/10 with activitiy in her right shoulder. Short Term Goal 4 Progress: Progressing toward goal Short Term Goal 5: Patient will decrease fascial restrictions to moderate in her right shoulder region.  Short Term Goal 5 Progress: Progressing toward goal Long Term Goals Time to Complete Long Term Goals: 6 weeks Long Term Goal 1: Patient will return to her prior level of I with all B/IADLs and leisure activities.   Long Term Goal 1 Progress: Progressing toward goal Long Term Goal 2: Patient will increase right shoulder AROM to [facility] for increased ability to reach into overhead cabinets. Long Term Goal 2 Progress: Progressing toward goal Long Term Goal 3: Patient will increase right shoulder strength to 5/5 for increased ability to lift laundry baskets. Long Term Goal 3 Progress: Progressing toward goal Long Term Goal 4: Patient will decrease pain level to 3/10 in her right shoulder when picking up her son. Long Term Goal 4 Progress: Progressing toward goal Long Term Goal 5: Patient will decrease fascial restrictions in her right shoulder to minimal.   Long Term Goal 5 Progress: Progressing toward goal  Problem List Patient Active Problem List  Diagnosis  . Obesity  . 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 Frozen shoulder syndrome  . Pain in joint, shoulder region  . Muscle weakness (generalized)  . Shoulder pain    End of Session Activity Tolerance: Patient tolerated treatment well General Behavior During Session: [facility] for tasks performed Cognition: [facility] for tasks performed   Shirlean Mylar, OTR/L  12/11/2012, 2:31 PM

## 2013-03-01 ENCOUNTER — Encounter (HOSPITAL_COMMUNITY): Payer: Self-pay

## 2013-03-01 ENCOUNTER — Emergency Department (HOSPITAL_COMMUNITY)
Admission: EM | Admit: 2013-03-01 | Discharge: 2013-03-02 | Disposition: A | Discharge status: Home / Self Care | Payer: Self-pay | Attending: Emergency Medicine | Admitting: Emergency Medicine

## 2013-03-01 DIAGNOSIS — Z8679 Personal history of other diseases of the circulatory system: Secondary | ICD-10-CM | POA: Insufficient documentation

## 2013-03-01 DIAGNOSIS — Z8739 Personal history of other diseases of the musculoskeletal system and connective tissue: Secondary | ICD-10-CM | POA: Insufficient documentation

## 2013-03-01 DIAGNOSIS — Z79899 Other long term (current) drug therapy: Secondary | ICD-10-CM | POA: Insufficient documentation

## 2013-03-01 DIAGNOSIS — R10A0 Flank pain, unspecified side: Secondary | ICD-10-CM

## 2013-03-01 DIAGNOSIS — R509 Fever, unspecified: Secondary | ICD-10-CM | POA: Insufficient documentation

## 2013-03-01 DIAGNOSIS — R197 Diarrhea, unspecified: Secondary | ICD-10-CM

## 2013-03-01 DIAGNOSIS — R52 Pain, unspecified: Secondary | ICD-10-CM | POA: Insufficient documentation

## 2013-03-01 DIAGNOSIS — E669 Obesity, unspecified: Secondary | ICD-10-CM | POA: Insufficient documentation

## 2013-03-01 DIAGNOSIS — R109 Unspecified abdominal pain: Secondary | ICD-10-CM | POA: Insufficient documentation

## 2013-03-01 MED ORDER — ACETAMINOPHEN 500 MG PO TABS
1000.0000 mg | ORAL_TABLET | Freq: Once | ORAL | Status: AC
  Administered 2013-03-02: 1000 mg via ORAL
  Filled 2013-03-01: qty 2

## 2013-03-01 MED ORDER — SODIUM CHLORIDE 0.9 % IV SOLN
Freq: Once | INTRAVENOUS | Status: AC
  Administered 2013-03-02: via INTRAVENOUS

## 2013-03-01 MED ORDER — ONDANSETRON 4 MG PO TBDP
4.0000 mg | ORAL_TABLET | Freq: Once | ORAL | Status: AC
  Administered 2013-03-02: 4 mg via ORAL
  Filled 2013-03-01: qty 1

## 2013-03-01 NOTE — ED Notes (Signed)
Pt c/o onset of fever, chills, body aches, diarrhea approx 4 hours ago, had 1 gm tylenol just prior to coming to e.d.

## 2013-03-01 NOTE — ED Provider Notes (Signed)
History     CSN: [id number]  Arrival date & time 03/01/13  2202   First MD Initiated Contact with Patient 03/01/13 2341      Chief Complaint  Patient presents with  . Fever  . Generalized Body Aches  . Diarrhea    (Consider location/radiation/quality/duration/timing/severity/associated sxs/prior treatment) HPI [name] is a 37 y.o. female who presents to the Emergency Department complaining of flank pain, chills, body aches, diarrhea that began 4 hours ago. She had taken one gram of tylenol an hour prior to coming to the ER.   PCP Dr. Tanya Nones  Past Medical History  Diagnosis Date  . Obesity     gastric Bypass, Roux-en Y  . Migraines   . Frozen shoulder syndrome     Past Surgical History  Procedure Laterality Date  . Gastric bypass  04/20/2011    [place], wt 271lb preop  . Fertility surgery  2009    No abnormalities in female    Family History  Problem Relation Age of Onset  . Diabetes Father   . Hyperlipidemia Father   . Hypertension Father   . Cancer Father     skin cancer  . Depression Mother     lifelong  . Anesthesia problems Neg Hx   . Arthritis      History  Substance Use Topics  . Smoking status: Never Smoker   . Smokeless tobacco: Never Used  . Alcohol Use: No    OB History   Grav Para Term Preterm Abortions TAB SAB Ect Mult Living   3 3 3  0 0 0 0 0 0 3      Review of Systems  Constitutional: Negative for fever.       10 Systems reviewed and are negative for acute change except as noted in the HPI.  HENT: Negative for congestion.   Eyes: Negative for discharge and redness.  Respiratory: Negative for cough and shortness of breath.   Cardiovascular: Negative for chest pain.  Gastrointestinal: Positive for nausea and diarrhea. Negative for vomiting and abdominal pain.  Genitourinary: Positive for flank pain.  Musculoskeletal: Negative for back pain.  Skin: Negative for rash.  Neurological: Negative for syncope, numbness  and headaches.  Psychiatric/Behavioral:       No behavior change.    Allergies  Morphine and related; Nsaids; and Penicillins  Home Medications   Current Outpatient Rx  Name  Route  Sig  Dispense  Refill  . Cyanocobalamin (VITAMIN B-12 IJ)   Injection   Inject 1 each as directed every 30 (thirty) days. Vitamin B-12 1045mcg/ml Injection         . HYDROcodone-acetaminophen (NORCO/VICODIN) 5-325 MG per tablet   Oral   Take 1-2 tablets by mouth every 6 (six) hours as needed. For pain         . nabumetone (RELAFEN) 500 MG tablet   Oral   Take 1 tablet (500 mg total) by mouth 2 (two) times daily.   60 tablet   5   . pantoprazole (PROTONIX) 20 MG tablet   Oral   Take 40 mg by mouth daily as needed. When taking NSAIDS         . Prenatal Vit-Fe Fumarate-FA (PRENATAL MULTIVITAMIN) TABS   Oral   Take 1 tablet by mouth daily.           BP 114/76  Pulse 123  Temp(Src) 100.2 F (37.9 C) (Oral)  Ht 5\' 5"  (1.651 m)  Wt 140 lb (63.504 kg)  BMI 23.3 kg/m2  SpO2 98%  LMP 02/22/2013  Physical Exam  Nursing note and vitals reviewed. Constitutional: She is oriented to person, place, and time.  Awake, alert, nontoxic appearance.  HENT:  Head: Normocephalic and atraumatic.  Right Ear: External ear normal.  Left Ear: External ear normal.  Mouth/Throat: Oropharynx is clear and moist.  Eyes: EOM are normal. Pupils are equal, round, and reactive to light. Right eye exhibits no discharge. Left eye exhibits no discharge.  Neck: Normal range of motion. Neck supple.  Cardiovascular: Normal rate.   Pulmonary/Chest: Effort normal and breath sounds normal. She exhibits no tenderness.  Abdominal: Soft. There is tenderness. There is no rebound.  Musculoskeletal: She exhibits no tenderness.  Baseline ROM, no obvious new focal weakness.  Neurological: She is alert and oriented to person, place, and time.  Mental status and motor strength appears baseline for patient and situation.   Skin: No rash noted.  Psychiatric: She has a normal mood and affect.    ED Course  Procedures (including critical care time) Results for orders placed during the hospital encounter of 08/03/12  GLUCOSE, CAPILLARY      Result Value Range   Glucose-Capillary 119 (*) 70 - 99 mg/dL     MDM  Patient here with diarrhea and flank pain. Given tylenol and zofran. No diarrhea since arrival in the ER. Pt stable in ED with no significant deterioration in condition.The patient appears reasonably screened and/or stabilized for discharge and I doubt any other medical condition or other [facility] requiring further screening, evaluation, or treatment in the ED at this time prior to discharge.  MDM Reviewed: nursing note and vitals Interpretation: labs           Nicoletta Dress. Colon Branch, MD 03/02/13 5621

## 2013-07-23 ENCOUNTER — Encounter (HOSPITAL_COMMUNITY): Payer: Self-pay | Admitting: *Deleted

## 2013-07-23 ENCOUNTER — Emergency Department (HOSPITAL_COMMUNITY)
Admission: EM | Admit: 2013-07-23 | Discharge: 2013-07-23 | Disposition: A | Discharge status: Home / Self Care | Attending: Emergency Medicine | Admitting: Emergency Medicine

## 2013-07-23 DIAGNOSIS — E669 Obesity, unspecified: Secondary | ICD-10-CM | POA: Insufficient documentation

## 2013-07-23 DIAGNOSIS — Z8739 Personal history of other diseases of the musculoskeletal system and connective tissue: Secondary | ICD-10-CM | POA: Insufficient documentation

## 2013-07-23 DIAGNOSIS — M62838 Other muscle spasm: Secondary | ICD-10-CM | POA: Insufficient documentation

## 2013-07-23 DIAGNOSIS — Z8669 Personal history of other diseases of the nervous system and sense organs: Secondary | ICD-10-CM | POA: Insufficient documentation

## 2013-07-23 DIAGNOSIS — M62830 Muscle spasm of back: Secondary | ICD-10-CM

## 2013-07-23 DIAGNOSIS — M545 Low back pain, unspecified: Secondary | ICD-10-CM | POA: Insufficient documentation

## 2013-07-23 DIAGNOSIS — Z880 Allergy status to penicillin: Secondary | ICD-10-CM | POA: Insufficient documentation

## 2013-07-23 MED ORDER — ONDANSETRON 8 MG PO TBDP
8.0000 mg | ORAL_TABLET | Freq: Once | ORAL | Status: AC
  Administered 2013-07-23: 8 mg via ORAL
  Filled 2013-07-23: qty 1

## 2013-07-23 MED ORDER — HYDROCODONE-ACETAMINOPHEN 5-325 MG PO TABS: ORAL_TABLET | ORAL | Status: DC

## 2013-07-23 MED ORDER — HYDROMORPHONE HCL PF 1 MG/ML IJ SOLN
1.0000 mg | Freq: Once | INTRAMUSCULAR | Status: AC
  Administered 2013-07-23: 1 mg via INTRAMUSCULAR
  Filled 2013-07-23: qty 1

## 2013-07-23 NOTE — ED Notes (Signed)
Pt states "I think I pulled a muscle in my back".  Reports injury happened a couple days ago, increased aching pain today.

## 2013-07-25 NOTE — ED Provider Notes (Signed)
CSN: [id number]     Arrival date & time 07/23/13  2043 History     First MD Initiated Contact with Patient 07/23/13 2052     Chief Complaint  Patient presents with  . Back Pain   (Consider location/radiation/quality/duration/timing/severity/associated sxs/prior Treatment) Patient is a 37 y.o. female presenting with back pain. The history is provided by the patient.  Back Pain Location:  Lumbar spine Quality:  Aching and cramping Radiates to:  Does not radiate Pain severity:  Moderate Pain is:  Same all the time Onset quality:  Sudden Duration:  2 days Timing:  Constant Progression:  Unchanged Chronicity:  Recurrent Context: lifting heavy objects   Relieved by:  Nothing Worsened by:  Bending, twisting, standing and sitting Ineffective treatments:  OTC medications Associated symptoms: no abdominal pain, no abdominal swelling, no bladder incontinence, no bowel incontinence, no chest pain, no dysuria, no fever, no headaches, no leg pain, no numbness, no paresthesias, no pelvic pain, no perianal numbness, no tingling and no weakness     Past Medical History  Diagnosis Date  . Obesity     gastric Bypass, Roux-en Y  . Migraines   . Frozen shoulder syndrome    Past Surgical History  Procedure Laterality Date  . Gastric bypass  04/20/2011    [place], [place] 271lb preop  . Fertility surgery  2009    No abnormalities in female   Family History  Problem Relation Age of Onset  . Diabetes Father   . Hyperlipidemia Father   . Hypertension Father   . Cancer Father     skin cancer  . Depression Mother     lifelong  . Anesthesia problems Neg Hx   . Arthritis     History  Substance Use Topics  . Smoking status: Never Smoker   . Smokeless tobacco: Never Used  . Alcohol Use: No   OB History   Grav Para Term Preterm Abortions TAB SAB Ect Mult Living   3 3 3  0 0 0 0 0 0 3     Review of Systems  Constitutional: Negative for fever.  Respiratory: Negative for shortness of  breath.   Cardiovascular: Negative for chest pain.  Gastrointestinal: Negative for vomiting, abdominal pain, constipation and bowel incontinence.  Genitourinary: Negative for bladder incontinence, dysuria, hematuria, flank pain, decreased urine volume, difficulty urinating and pelvic pain.       No perineal numbness or incontinence of urine or feces  Musculoskeletal: Positive for back pain. Negative for joint swelling.  Skin: Negative for rash.  Neurological: Negative for tingling, weakness, numbness, headaches and paresthesias.  All other systems reviewed and are negative.    Allergies  Morphine and related; Nsaids; and Penicillins  Home Medications   Current Outpatient Rx  Name  Route  Sig  Dispense  Refill  . acetaminophen (TYLENOL) 500 MG tablet   Oral   Take 1,000 mg by mouth every 6 (six) hours as needed for pain.         Marland Kitchen HYDROcodone-acetaminophen (NORCO/VICODIN) 5-325 MG per tablet      Take one-two tabs po q 4-6 hrs prn pain   12 tablet   0    BP 105/72  Pulse 72  Temp(Src) 97.7 F (36.5 C) (Oral)  Resp 20  Ht 5\' 5"  (1.651 m)  Wt 140 lb (63.504 kg)  BMI 23.3 kg/m2  SpO2 100%  LMP 06/23/2013 Physical Exam  Nursing note and vitals reviewed. Constitutional: She is oriented to person, place, and time.  She appears well-developed and well-nourished. No distress.  HENT:  Head: Normocephalic and atraumatic.  Neck: Normal range of motion. Neck supple.  Cardiovascular: Normal rate, regular rhythm, normal heart sounds and intact distal pulses.   No murmur heard. Pulmonary/Chest: Effort normal and breath sounds normal. No respiratory distress.  Abdominal: Soft. She exhibits no distension. There is no tenderness.  Musculoskeletal: She exhibits tenderness. She exhibits no edema.       Lumbar back: She exhibits tenderness and pain. She exhibits normal range of motion, no swelling, no deformity, no laceration and normal pulse.  ttp of the left lumbar paraspinal  muscles.  No spinal tenderness.  DP pulses are brisk and symmetrical.  Distal sensation intact.  Hip Flexors/Extensors are intact  Neurological: She is alert and oriented to person, place, and time. No cranial nerve deficit or sensory deficit. She exhibits normal muscle tone. Coordination and gait normal.  Reflex Scores:      Patellar reflexes are 2+ on the right side and 2+ on the left side.      Achilles reflexes are 2+ on the right side and 2+ on the left side. Skin: Skin is warm and dry.    ED Course   Procedures (including critical care time)  Labs Reviewed - No data to display No results found. 1. Back pain, lumbosacral   2. Muscle spasm of back     MDM     Patient has ttp of the left lumbar paraspinal muscles.  No focal neuro deficits on exam.  Ambulates with a steady gait.   Doubt emergent neurological or infectious process.  Patient agrees to rest, ice/heat and close f/u with her PMD  Zia Najera L. Trisha Mangle, PA-C 07/25/13 1533

## 2013-07-28 NOTE — ED Provider Notes (Signed)
Medical screening examination/treatment/procedure(s) were performed by non-physician practitioner and as supervising physician I was immediately available for consultation/collaboration.   Carleene Cooper III, MD 07/28/13 [phone]

## 2013-07-30 ENCOUNTER — Emergency Department (HOSPITAL_COMMUNITY)
Admission: EM | Admit: 2013-07-30 | Discharge: 2013-07-30 | Disposition: A | Discharge status: Home / Self Care | Attending: Emergency Medicine | Admitting: Emergency Medicine

## 2013-07-30 DIAGNOSIS — H53149 Visual discomfort, unspecified: Secondary | ICD-10-CM | POA: Insufficient documentation

## 2013-07-30 DIAGNOSIS — M545 Low back pain, unspecified: Secondary | ICD-10-CM | POA: Insufficient documentation

## 2013-07-30 DIAGNOSIS — Z8669 Personal history of other diseases of the nervous system and sense organs: Secondary | ICD-10-CM | POA: Insufficient documentation

## 2013-07-30 DIAGNOSIS — Z880 Allergy status to penicillin: Secondary | ICD-10-CM | POA: Insufficient documentation

## 2013-07-30 DIAGNOSIS — R51 Headache: Secondary | ICD-10-CM | POA: Insufficient documentation

## 2013-07-30 DIAGNOSIS — M79609 Pain in unspecified limb: Secondary | ICD-10-CM | POA: Insufficient documentation

## 2013-07-30 DIAGNOSIS — Z9884 Bariatric surgery status: Secondary | ICD-10-CM | POA: Insufficient documentation

## 2013-07-30 DIAGNOSIS — Z8739 Personal history of other diseases of the musculoskeletal system and connective tissue: Secondary | ICD-10-CM | POA: Insufficient documentation

## 2013-07-30 DIAGNOSIS — Z8679 Personal history of other diseases of the circulatory system: Secondary | ICD-10-CM | POA: Insufficient documentation

## 2013-07-30 MED ORDER — DEXAMETHASONE SODIUM PHOSPHATE 4 MG/ML IJ SOLN
10.0000 mg | Freq: Once | INTRAMUSCULAR | Status: AC
  Administered 2013-07-30: 10 mg via INTRAMUSCULAR
  Filled 2013-07-30: qty 3

## 2013-07-30 MED ORDER — DIPHENHYDRAMINE HCL 50 MG/ML IJ SOLN
25.0000 mg | Freq: Once | INTRAMUSCULAR | Status: AC
  Administered 2013-07-30: 25 mg via INTRAMUSCULAR
  Filled 2013-07-30: qty 1

## 2013-07-30 MED ORDER — METOCLOPRAMIDE HCL 5 MG/ML IJ SOLN
10.0000 mg | Freq: Once | INTRAMUSCULAR | Status: AC
  Administered 2013-07-30: 10 mg via INTRAMUSCULAR
  Filled 2013-07-30: qty 2

## 2013-07-30 NOTE — ED Notes (Signed)
States that she is having lower back pain, states that she has a migraine x2 days.

## 2013-08-01 NOTE — ED Provider Notes (Signed)
CSN: [id number]     Arrival date & time 07/30/13  1426 History     First MD Initiated Contact with Patient 07/30/13 1539     Chief Complaint  Patient presents with  . Back Pain  . Headache   (Consider location/radiation/quality/duration/timing/severity/associated sxs/prior Treatment) Patient is a 37 y.o. female presenting with back pain and headaches. The history is provided by the patient.  Back Pain Location:  Lumbar spine Quality:  Aching Radiates to:  L posterior upper leg Pain severity:  Moderate Pain is:  Same all the time Onset quality:  Gradual Timing:  Constant Progression:  Unchanged Chronicity:  Recurrent Context: not falling, not recent illness and not recent injury   Relieved by:  Nothing Worsened by:  Bending, twisting and movement Ineffective treatments:  OTC medications Associated symptoms: headaches and leg pain   Associated symptoms: no abdominal pain, no abdominal swelling, no bladder incontinence, no bowel incontinence, no chest pain, no dysuria, no fever, no numbness, no paresthesias, no pelvic pain, no perianal numbness, no tingling and no weakness   Associated symptoms comment:  Frontal headache Headaches:    Severity:  Moderate   Onset quality:  Gradual   Duration:  2 days   Timing:  Constant   Progression:  Unchanged   Chronicity:  Recurrent Headache Associated symptoms: back pain and photophobia   Associated symptoms: no abdominal pain, no dizziness, no pain, no fever, no nausea, no neck pain, no neck stiffness, no numbness, no paresthesias and no vomiting     Past Medical History  Diagnosis Date  . Obesity     gastric Bypass, Roux-en Y  . Migraines   . Frozen shoulder syndrome    Past Surgical History  Procedure Laterality Date  . Gastric bypass  04/20/2011    [place], wt 271lb preop  . Fertility surgery  2009    No abnormalities in female   Family History  Problem Relation Age of Onset  . Diabetes Father   . Hyperlipidemia  Father   . Hypertension Father   . Cancer Father     skin cancer  . Depression Mother     lifelong  . Anesthesia problems Neg Hx   . Arthritis     History  Substance Use Topics  . Smoking status: Never Smoker   . Smokeless tobacco: Never Used  . Alcohol Use: No   OB History   Grav Para Term Preterm Abortions TAB SAB Ect Mult Living   3 3 3  0 0 0 0 0 0 3     Review of Systems  Constitutional: Negative for fever, activity change and appetite change.  HENT: Negative for facial swelling, trouble swallowing, neck pain and neck stiffness.   Eyes: Positive for photophobia. Negative for pain and visual disturbance.  Respiratory: Negative for shortness of breath.   Cardiovascular: Negative for chest pain.  Gastrointestinal: Negative for nausea, vomiting, abdominal pain, constipation and bowel incontinence.  Genitourinary: Negative for bladder incontinence, dysuria, frequency, hematuria, flank pain, decreased urine volume, difficulty urinating and pelvic pain.       No perineal numbness or incontinence of urine or feces  Musculoskeletal: Positive for back pain. Negative for joint swelling.  Skin: Negative for rash and wound.  Neurological: Positive for headaches. Negative for dizziness, tingling, facial asymmetry, speech difficulty, weakness, numbness and paresthesias.  Psychiatric/Behavioral: Negative for confusion and decreased concentration.  All other systems reviewed and are negative.    Allergies  Morphine and related; Nsaids; and Penicillins  Home  Medications   Current Outpatient Rx  Name  Route  Sig  Dispense  Refill  . acetaminophen (TYLENOL) 500 MG tablet   Oral   Take 1,000 mg by mouth every 6 (six) hours as needed for pain.          BP 120/67  Pulse 98  Temp(Src) 97.5 F (36.4 C) (Oral)  Resp 20  Wt 145 lb (65.772 kg)  BMI 24.13 kg/m2  SpO2 100%  LMP 07/23/2013  Breastfeeding? No Physical Exam  Nursing note and vitals reviewed. Constitutional: She is  oriented to person, place, and time. She appears well-developed and well-nourished. No distress.  HENT:  Head: Normocephalic and atraumatic.  Mouth/Throat: Oropharynx is clear and moist.  Eyes: EOM are normal. Pupils are equal, round, and reactive to light.  Neck: Normal range of motion and phonation normal. Neck supple. No rigidity. No Brudzinski's sign and no Kernig's sign noted.  Cardiovascular: Normal rate, regular rhythm, normal heart sounds and intact distal pulses.   No murmur heard. Pulmonary/Chest: Effort normal and breath sounds normal. No respiratory distress.  Musculoskeletal: Normal range of motion. She exhibits tenderness. She exhibits no edema.       Lumbar back: She exhibits tenderness and pain. She exhibits normal range of motion, no bony tenderness, no swelling, no deformity, no laceration and normal pulse.       Back:  ttp of the left lumbar paraspinal muscles.  No spinal tenderness.  DP pulses are brisk and symmetrical.  Distal sensation intact.  Hip Flexors/Extensors are intact  Neurological: She is alert and oriented to person, place, and time. No cranial nerve deficit or sensory deficit. She exhibits normal muscle tone. Coordination and gait normal.  Reflex Scores:      Tricep reflexes are 2+ on the right side and 2+ on the left side.      Bicep reflexes are 2+ on the right side and 2+ on the left side.      Patellar reflexes are 2+ on the right side and 2+ on the left side.      Achilles reflexes are 2+ on the right side and 2+ on the left side. Skin: Skin is warm and dry.    ED Course   Procedures (including critical care time)  Labs Reviewed - No data to display No results found. 1. Headache   2. Low back pain     MDM  Patient treated with IM beandaryl, decadron and reglan.    Vitals stable,  Pt is non-toxic appearing.  No focal neuro deficits, no meningeal signs.  Headache of gradual onset that is similar to previous.  Patient agrees to close f/u with  PMD or to return here if the symptoms worsen. Pt seen here recently for back pain.  No new sx's.  I have advised pt that she needs to f/u with her PMD.  When asked if she had made appt with her PMD from her previous visit, she stated " I haven't had time to call, I'll call them tomorrow".    Simon Aaberg L. Trisha Mangle, PA-C 08/01/13 1355

## 2013-08-06 NOTE — ED Provider Notes (Signed)
Medical screening examination/treatment/procedure(s) were performed by non-physician practitioner and as supervising physician I was immediately available for consultation/collaboration.  Raeford Razor, MD 08/06/13 [phone]

## 2013-08-20 ENCOUNTER — Ambulatory Visit (INDEPENDENT_AMBULATORY_CARE_PROVIDER_SITE_OTHER): Admitting: Family Medicine

## 2013-08-20 ENCOUNTER — Encounter: Payer: Self-pay | Admitting: Family Medicine

## 2013-08-20 VITALS — BP 110/64 | HR 102 | Temp 98.0°F | Resp 16 | Ht 65.0 in | Wt 156.0 lb

## 2013-08-20 DIAGNOSIS — F411 Generalized anxiety disorder: Secondary | ICD-10-CM

## 2013-08-20 MED ORDER — VENLAFAXINE HCL ER 75 MG PO CP24: 150.0000 mg | ORAL_CAPSULE | Freq: Every day | ORAL | Status: DC

## 2013-08-20 MED ORDER — ALPRAZOLAM 0.5 MG PO TABS: 0.5000 mg | ORAL_TABLET | Freq: Three times a day (TID) | ORAL | Status: DC | PRN

## 2013-08-21 ENCOUNTER — Encounter: Payer: Self-pay | Admitting: Family Medicine

## 2013-08-21 NOTE — Progress Notes (Signed)
Subjective:    Patient ID: [name], female    DOB: 13-Aug-1976, 37 y.o.   MRN: [id number]  HPI Patient presents complaining of generalized anxiety.  Currently she works third shift at a hotel. 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 She also is taking classes to receive her degree in criminal justice. Due to the increased stress and demands on her time, she has developed a constant sense of anxiety, frequent panic attacks that are characterized by a racing heartbeat, dyspnea, an overwhelming sense that something bad is going to happen.   This occurs 3-4 days a week. Furthermore she is unable to sleep. She states that she is unable to turn her mind off and that her thoughts are constantly racing through her mind causing worry and fear.  She is only able to get 2-3 hours of sleep per day. Past Medical History  Diagnosis Date  . Obesity     gastric Bypass, Roux-en Y  . Migraines   . Frozen shoulder syndrome   . Depression    Past Surgical History  Procedure Laterality Date  . Gastric bypass  04/20/2011    [place], wt 271lb preop  . Fertility surgery  2009    No abnormalities in female   No current outpatient prescriptions on file prior to visit.   No current facility-administered medications on file prior to visit.   Allergies  Allergen Reactions  . Morphine And Related Other (See Comments)    Internal burning sensation post surgical prcedure  . Nsaids Other (See Comments)    Due to gastric bypass  . Penicillins Itching and Swelling   History   Social History  . Marital Status: Married    Spouse Name: N/A    Number of Children: 2  . Years of Education: 13   Occupational History  . 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 Smoking status: Never Smoker   . Smokeless tobacco: Never Used  . Alcohol Use: No  . Drug Use: No  . Sexual Activity: Yes    Partners:  Male     Comment: single possible conception date July 22, 2011   Other Topics Concern  . Not on file   Social History Narrative  . No narrative on file   Family History  Problem Relation Age of Onset  . Diabetes Father   . Hyperlipidemia Father   . Hypertension Father   . Cancer Father     skin cancer  . Depression Mother     lifelong  . Anesthesia problems Neg Hx   . Arthritis        Review of Systems  All other systems reviewed and are negative.       Objective:   Physical Exam  Vitals reviewed. Constitutional: She is oriented to person, place, and time. She appears well-developed and well-nourished.  Cardiovascular: Normal rate and normal heart sounds.   No murmur heard. Pulmonary/Chest: Effort normal and breath sounds normal. No respiratory distress. She has no wheezes. She has no rales.  Neurological: She is alert and oriented to person, place, and time. She has normal reflexes.  Psychiatric: She has a normal mood and affect. Her behavior is normal. Judgment and thought content normal.   patient has periorbital swelling consistent with someone who has not been sleeping.        Assessment & Plan:  1. GAD (generalized anxiety disorder) I explained to the patient that I felt her anxiety disorder was likely due to to her tremendous amount of responsibility. I recommended trying to simplify her scheduled as much is possible.  Begin Effexor XR 75 mg by mouth every morning and increase to 150 mg in one week if she is not experiencing side effects. Recheck in one month. Uses Xanax 0.5 mg by mouth as needed to help her go to sleep but I cautioned her to use it sparingly. I also recommended that she not breast feed if she takes these medications. - venlafaxine XR (EFFEXOR XR) 75 MG 24 hr capsule; Take 2 capsules (150 mg total) by mouth daily.  Dispense: 60 capsule; Refill: 5 - ALPRAZolam (XANAX) 0.5 MG tablet; Take 1 tablet (0.5 mg total) by mouth 3 (three) times daily as  needed for sleep or anxiety.  Dispense: 20 tablet; Refill: 0

## 2013-09-21 ENCOUNTER — Ambulatory Visit: Admitting: Family Medicine

## 2013-12-11 ENCOUNTER — Emergency Department (HOSPITAL_COMMUNITY)
Admission: EM | Admit: 2013-12-11 | Discharge: 2013-12-11 | Disposition: A | Discharge status: Home / Self Care | Attending: Emergency Medicine | Admitting: Emergency Medicine

## 2013-12-11 ENCOUNTER — Encounter (HOSPITAL_COMMUNITY): Payer: Self-pay | Admitting: Emergency Medicine

## 2013-12-11 ENCOUNTER — Emergency Department (HOSPITAL_COMMUNITY)

## 2013-12-11 DIAGNOSIS — F329 Major depressive disorder, single episode, unspecified: Secondary | ICD-10-CM | POA: Insufficient documentation

## 2013-12-11 DIAGNOSIS — Z8679 Personal history of other diseases of the circulatory system: Secondary | ICD-10-CM | POA: Insufficient documentation

## 2013-12-11 DIAGNOSIS — R109 Unspecified abdominal pain: Secondary | ICD-10-CM | POA: Insufficient documentation

## 2013-12-11 DIAGNOSIS — Z79899 Other long term (current) drug therapy: Secondary | ICD-10-CM | POA: Insufficient documentation

## 2013-12-11 DIAGNOSIS — Z9884 Bariatric surgery status: Secondary | ICD-10-CM | POA: Insufficient documentation

## 2013-12-11 DIAGNOSIS — Z8739 Personal history of other diseases of the musculoskeletal system and connective tissue: Secondary | ICD-10-CM | POA: Insufficient documentation

## 2013-12-11 DIAGNOSIS — Z3202 Encounter for pregnancy test, result negative: Secondary | ICD-10-CM | POA: Insufficient documentation

## 2013-12-11 DIAGNOSIS — F3289 Other specified depressive episodes: Secondary | ICD-10-CM | POA: Insufficient documentation

## 2013-12-11 DIAGNOSIS — E669 Obesity, unspecified: Secondary | ICD-10-CM | POA: Insufficient documentation

## 2013-12-11 DIAGNOSIS — Z87442 Personal history of urinary calculi: Secondary | ICD-10-CM | POA: Insufficient documentation

## 2013-12-11 DIAGNOSIS — Z880 Allergy status to penicillin: Secondary | ICD-10-CM | POA: Insufficient documentation

## 2013-12-11 LAB — URINALYSIS, ROUTINE W REFLEX MICROSCOPIC
Glucose, UA: NEGATIVE mg/dL
Ketones, ur: NEGATIVE mg/dL
Leukocytes, UA: NEGATIVE
Protein, ur: NEGATIVE mg/dL
Urobilinogen, UA: 0.2 mg/dL (ref 0.0–1.0)

## 2013-12-11 MED ORDER — HYDROCODONE-ACETAMINOPHEN 5-325 MG PO TABS: 2.0000 | ORAL_TABLET | ORAL | Status: DC | PRN

## 2013-12-11 MED ORDER — KETOROLAC TROMETHAMINE 60 MG/2ML IM SOLN
60.0000 mg | Freq: Once | INTRAMUSCULAR | Status: AC
  Administered 2013-12-11: 60 mg via INTRAMUSCULAR
  Filled 2013-12-11: qty 2

## 2013-12-11 MED ORDER — HYDROCODONE-ACETAMINOPHEN 5-325 MG PO TABS
2.0000 | ORAL_TABLET | Freq: Once | ORAL | Status: AC
  Administered 2013-12-11: 2 via ORAL
  Filled 2013-12-11: qty 2

## 2013-12-11 NOTE — ED Notes (Signed)
Requesting something for pain - injection has not provided any relief- MD informed

## 2013-12-11 NOTE — ED Notes (Signed)
Pt reports bilat flank pain for about 4 days, states she has continued to have pain and is nauseated and also having diarrhea.  Pt states she passed 2 kidney stones 4 days ago

## 2013-12-11 NOTE — ED Provider Notes (Signed)
CSN: [id number]     Arrival date & time 12/11/13  2956 History   First MD Initiated Contact with Patient 12/11/13 [phone]     Chief Complaint  Patient presents with  . Flank Pain   (Consider location/radiation/quality/duration/timing/severity/associated sxs/prior Treatment) HPI Comments: Patient is a 37 year old female status post gastric bypass and history of renal calculi. She presents today with complaints of bilateral flank pain that she states has been going on for 4 days. She denies any injury or trauma. She states that she is having difficulty voiding this evening. She states that several days ago she "passed 2 kidney stones" and believes that she has more. These stones were tiny and she states lined with blood. She denies any fevers or chills. She denies any bowel complaints.  Patient is a 37 y.o. female presenting with flank pain. The history is provided by the patient.  Flank Pain This is a new problem. Episode onset: 4 days ago. The problem occurs constantly. The problem has been gradually worsening. Pertinent negatives include no abdominal pain and no shortness of breath. Nothing aggravates the symptoms. Nothing relieves the symptoms. She has tried nothing for the symptoms. The treatment provided no relief.    Past Medical History  Diagnosis Date  . Obesity     gastric Bypass, Roux-en Y  . Migraines   . Frozen shoulder syndrome   . Depression    Past Surgical History  Procedure Laterality Date  . Gastric bypass  04/20/2011    [place], [place] 271lb preop  . Fertility surgery  2009    No abnormalities in female   Family History  Problem Relation Age of Onset  . Diabetes Father   . Hyperlipidemia Father   . Hypertension Father   . Cancer Father     skin cancer  . Depression Mother     lifelong  . Anesthesia problems Neg Hx   . Arthritis     History  Substance Use Topics  . Smoking status: Never Smoker   . Smokeless tobacco: Never Used  . Alcohol Use: No   OB  History   Grav Para Term Preterm Abortions TAB SAB Ect Mult Living   3 3 3  0 0 0 0 0 0 3     Review of Systems  Respiratory: Negative for shortness of breath.   Gastrointestinal: Negative for abdominal pain.  Genitourinary: Positive for flank pain.  All other systems reviewed and are negative.    Allergies  Morphine and related; Nsaids; and Penicillins  Home Medications   Current Outpatient Rx  Name  Route  Sig  Dispense  Refill  . acetaminophen (TYLENOL) 500 MG tablet   Oral   Take 500 mg by mouth every 6 (six) hours as needed.         . ALPRAZolam (XANAX) 0.5 MG tablet   Oral   Take 1 tablet (0.5 mg total) by mouth 3 (three) times daily as needed for sleep or anxiety.   20 tablet   0   . venlafaxine XR (EFFEXOR XR) 75 MG 24 hr capsule   Oral   Take 2 capsules (150 mg total) by mouth daily.   60 capsule   5    BP 109/67  Pulse 73  Temp(Src) 98 F (36.7 C) (Oral)  Resp 20  Ht 5\' 5"  (1.651 m)  Wt 150 lb (68.04 kg)  BMI 24.96 kg/m2  SpO2 99%  LMP 11/19/2013 Physical Exam  Nursing note and vitals reviewed. Constitutional: She is  oriented to person, place, and time. She appears well-developed and well-nourished. No distress.  HENT:  Head: Normocephalic and atraumatic.  Neck: Normal range of motion. Neck supple.  Cardiovascular: Normal rate and regular rhythm.  Exam reveals no gallop and no friction rub.   No murmur heard. Pulmonary/Chest: Effort normal and breath sounds normal. No respiratory distress. She has no wheezes.  Abdominal: Soft. Bowel sounds are normal. She exhibits no distension. There is no tenderness.  Musculoskeletal: Normal range of motion.  Neurological: She is alert and oriented to person, place, and time. She has normal reflexes. She exhibits normal muscle tone. Coordination normal.  Skin: Skin is warm and dry. She is not diaphoretic.    ED Course  Procedures (including critical care time) Labs Review Labs Reviewed  URINALYSIS,  ROUTINE W REFLEX MICROSCOPIC  PREGNANCY, URINE   Imaging Review No results found.    MDM  No diagnosis found. Patient is a 37 year old female presents with bilateral flank pain. She states that she "passed 2 kidney stones" 2 days ago. Her urinalysis is clear pregnancy test is negative. CT scan of the abdomen and pelvis reveals no evidence for kidney stones. There is no other pathology noted on the scan. I am uncertain of the nature of her discomfort however it sounds musculoskeletal in nature as I have found no other explanation. She was given Toradol without relief. She will be discharged with a small number of hydrocodone and advised to followup with her primary Dr. if not improving in the next week.    Geoffery Lyons, MD 12/11/13 [phone]

## 2013-12-18 ENCOUNTER — Other Ambulatory Visit (HOSPITAL_COMMUNITY): Payer: Self-pay | Admitting: Nurse Practitioner

## 2013-12-18 ENCOUNTER — Ambulatory Visit (HOSPITAL_COMMUNITY)

## 2013-12-18 ENCOUNTER — Telehealth: Payer: Self-pay | Admitting: Family Medicine

## 2013-12-18 ENCOUNTER — Other Ambulatory Visit: Payer: Self-pay | Admitting: Family Medicine

## 2013-12-18 DIAGNOSIS — N949 Unspecified condition associated with female genital organs and menstrual cycle: Secondary | ICD-10-CM

## 2013-12-18 DIAGNOSIS — F411 Generalized anxiety disorder: Secondary | ICD-10-CM

## 2013-12-18 NOTE — Telephone Encounter (Signed)
?   OK to Refill  

## 2013-12-18 NOTE — Telephone Encounter (Signed)
Pt is wanting to know if she can get a refill on her xanax last time dr pickard gave it to her she told him not to give her refills well she is actually needing refills Call back number is [phone]

## 2013-12-21 ENCOUNTER — Ambulatory Visit (HOSPITAL_COMMUNITY): Payer: Self-pay

## 2013-12-21 ENCOUNTER — Ambulatory Visit (HOSPITAL_COMMUNITY)
Admission: RE | Admit: 2013-12-21 | Discharge: 2013-12-21 | Disposition: A | Discharge status: Home / Self Care | Payer: Self-pay | Source: Ambulatory Visit | Attending: Nurse Practitioner | Admitting: Nurse Practitioner

## 2013-12-21 DIAGNOSIS — N949 Unspecified condition associated with female genital organs and menstrual cycle: Secondary | ICD-10-CM

## 2013-12-21 MED ORDER — ALPRAZOLAM 0.5 MG PO TABS: 0.5000 mg | ORAL_TABLET | Freq: Three times a day (TID) | ORAL | Status: DC | PRN

## 2013-12-21 NOTE — Telephone Encounter (Signed)
ok 

## 2013-12-21 NOTE — Telephone Encounter (Signed)
rx called in and pt aware per vm

## 2013-12-22 ENCOUNTER — Ambulatory Visit (HOSPITAL_COMMUNITY)

## 2013-12-22 ENCOUNTER — Other Ambulatory Visit (HOSPITAL_COMMUNITY)

## 2014-02-24 ENCOUNTER — Encounter (HOSPITAL_COMMUNITY): Payer: Self-pay | Admitting: Emergency Medicine

## 2014-02-24 ENCOUNTER — Emergency Department (HOSPITAL_COMMUNITY)
Admission: EM | Admit: 2014-02-24 | Discharge: 2014-02-24 | Disposition: A | Discharge status: Home / Self Care | Payer: Managed Care, Other (non HMO) | Attending: Emergency Medicine | Admitting: Emergency Medicine

## 2014-02-24 DIAGNOSIS — Z880 Allergy status to penicillin: Secondary | ICD-10-CM | POA: Diagnosis not present

## 2014-02-24 DIAGNOSIS — Z3202 Encounter for pregnancy test, result negative: Secondary | ICD-10-CM | POA: Diagnosis not present

## 2014-02-24 DIAGNOSIS — Z8669 Personal history of other diseases of the nervous system and sense organs: Secondary | ICD-10-CM | POA: Insufficient documentation

## 2014-02-24 DIAGNOSIS — R112 Nausea with vomiting, unspecified: Secondary | ICD-10-CM | POA: Diagnosis not present

## 2014-02-24 DIAGNOSIS — R52 Pain, unspecified: Secondary | ICD-10-CM | POA: Diagnosis present

## 2014-02-24 DIAGNOSIS — Z8659 Personal history of other mental and behavioral disorders: Secondary | ICD-10-CM | POA: Diagnosis not present

## 2014-02-24 DIAGNOSIS — E669 Obesity, unspecified: Secondary | ICD-10-CM | POA: Diagnosis not present

## 2014-02-24 DIAGNOSIS — J111 Influenza due to unidentified influenza virus with other respiratory manifestations: Secondary | ICD-10-CM | POA: Diagnosis not present

## 2014-02-24 LAB — URINALYSIS, ROUTINE W REFLEX MICROSCOPIC
BILIRUBIN URINE: NEGATIVE
Glucose, UA: NEGATIVE mg/dL
Hgb urine dipstick: NEGATIVE
KETONES UR: NEGATIVE mg/dL
Leukocytes, UA: NEGATIVE
Nitrite: NEGATIVE
PH: 7 (ref 5.0–8.0)
Protein, ur: NEGATIVE mg/dL
SPECIFIC GRAVITY, URINE: 1.01 (ref 1.005–1.030)
UROBILINOGEN UA: 0.2 mg/dL (ref 0.0–1.0)

## 2014-02-24 LAB — COMPREHENSIVE METABOLIC PANEL
ALBUMIN: 4.1 g/dL (ref 3.5–5.2)
ALK PHOS: 99 U/L (ref 39–117)
ALT: 14 U/L (ref 0–35)
AST: 19 U/L (ref 0–37)
BUN: 6 mg/dL (ref 6–23)
CO2: 27 mEq/L (ref 19–32)
CREATININE: 0.53 mg/dL (ref 0.50–1.10)
Calcium: 9.2 mg/dL (ref 8.4–10.5)
Chloride: 101 mEq/L (ref 96–112)
GFR calc non Af Amer: >90 mL/min (ref >90)
Glucose, Bld: 118 mg/dL — ABNORMAL HIGH (ref 70–99)
Potassium: 4 mEq/L (ref 3.7–5.3)
Sodium: 140 mEq/L (ref 137–147)
TOTAL PROTEIN: 8 g/dL (ref 6.0–8.3)
Total Bilirubin: 0.5 mg/dL (ref 0.3–1.2)

## 2014-02-24 LAB — CBC WITH DIFFERENTIAL/PLATELET
BASOS ABS: 0 10*3/uL (ref 0.0–0.1)
Basophils Relative: 0 % (ref 0–1)
EOS ABS: 0 10*3/uL (ref 0.0–0.7)
Eosinophils Relative: 0 % (ref 0–5)
HCT: 33.7 % — ABNORMAL LOW (ref 36.0–46.0)
Hemoglobin: 10.6 g/dL — ABNORMAL LOW (ref 12.0–15.0)
LYMPHS ABS: 0.9 10*3/uL (ref 0.7–4.0)
Lymphocytes Relative: 10 % — ABNORMAL LOW (ref 12–46)
MCH: 23.3 pg — AB (ref 26.0–34.0)
MCHC: 31.5 g/dL (ref 30.0–36.0)
MCV: 74.2 fL — AB (ref 78.0–100.0)
MONO ABS: 0.5 10*3/uL (ref 0.1–1.0)
Monocytes Relative: 6 % (ref 3–12)
NEUTROS PCT: 84 % — AB (ref 43–77)
Neutro Abs: 7.2 10*3/uL (ref 1.7–7.7)
PLATELETS: 263 10*3/uL (ref 150–400)
RBC: 4.54 MIL/uL (ref 3.87–5.11)
RDW: 14.4 % (ref 11.5–15.5)
WBC: 8.6 10*3/uL (ref 4.0–10.5)

## 2014-02-24 LAB — PREGNANCY, URINE: Preg Test, Ur: NEGATIVE

## 2014-02-24 LAB — LIPASE, BLOOD: LIPASE: 20 U/L (ref 11–59)

## 2014-02-24 MED ORDER — ONDANSETRON 8 MG PO TBDP
8.0000 mg | ORAL_TABLET | Freq: Once | ORAL | Status: AC
  Administered 2014-02-24: 8 mg via ORAL
  Filled 2014-02-24: qty 1

## 2014-02-24 MED ORDER — SODIUM CHLORIDE 0.9 % IV SOLN
1000.0000 mL | Freq: Once | INTRAVENOUS | Status: AC
  Administered 2014-02-24: 1000 mL via INTRAVENOUS

## 2014-02-24 MED ORDER — KETOROLAC TROMETHAMINE 30 MG/ML IJ SOLN
30.0000 mg | Freq: Once | INTRAMUSCULAR | Status: AC
  Administered 2014-02-24: 30 mg via INTRAVENOUS
  Filled 2014-02-24: qty 1

## 2014-02-24 MED ORDER — ONDANSETRON HCL 8 MG PO TABS: 8.0000 mg | ORAL_TABLET | Freq: Three times a day (TID) | ORAL | Status: DC | PRN

## 2014-02-24 MED ORDER — SODIUM CHLORIDE 0.9 % IV BOLUS (SEPSIS)
1000.0000 mL | Freq: Once | INTRAVENOUS | Status: AC
  Administered 2014-02-24: 1000 mL via INTRAVENOUS

## 2014-02-24 MED ORDER — LORAZEPAM 2 MG/ML IJ SOLN
1.0000 mg | Freq: Once | INTRAMUSCULAR | Status: AC
  Administered 2014-02-24: 1 mg via INTRAVENOUS
  Filled 2014-02-24: qty 1

## 2014-02-24 NOTE — Discharge Instructions (Signed)

## 2014-02-24 NOTE — ED Notes (Signed)
Pt c/o generalized body aches, n/v since waking today.  Denies any diarrhea.  LBM was today.  Denies abd pain.

## 2014-02-24 NOTE — ED Provider Notes (Signed)
CSN: [id number]     Arrival date & time 02/24/14  1738 History   First MD Initiated Contact with Patient 02/24/14 1915     Chief Complaint  Patient presents with  . Generalized Body Aches  . Emesis     (Consider location/radiation/quality/duration/timing/severity/associated sxs/prior Treatment) Patient is a 38 y.o. female presenting with vomiting. The history is provided by the patient.  Emesis  She is here for evaluation of generalized achiness associated with nausea and vomiting, but no diarrhea. She had onset of these symptoms, this morning. She did not take her temperature at home. She has had nonproductive cough and sore throat. She has no known sick exposures. She did not take any influenza vaccines this year. She is using her usual medications, without relief. There are no known modifying factors.  Past Medical History  Diagnosis Date  . Obesity     gastric Bypass, Roux-en Y  . Migraines   . Frozen shoulder syndrome   . Depression    Past Surgical History  Procedure Laterality Date  . Gastric bypass  04/20/2011    [place], [place] 271lb preop  . Fertility surgery  2009    No abnormalities in female   Family History  Problem Relation Age of Onset  . Diabetes Father   . Hyperlipidemia Father   . Hypertension Father   . Cancer Father     skin cancer  . Depression Mother     lifelong  . Anesthesia problems Neg Hx   . Arthritis     History  Substance Use Topics  . Smoking status: Never Smoker   . Smokeless tobacco: Never Used  . Alcohol Use: No   OB History   Grav Para Term Preterm Abortions TAB SAB Ect Mult Living   3 3 3  0 0 0 0 0 0 3     Review of Systems  Gastrointestinal: Positive for vomiting.  All other systems reviewed and are negative.      Allergies  Morphine and related; Nsaids; and Penicillins  Home Medications   Current Outpatient Rx  Name  Route  Sig  Dispense  Refill  . ondansetron (ZOFRAN) 8 MG tablet   Oral   Take 1 tablet (8  mg total) by mouth every 8 (eight) hours as needed for nausea or vomiting.   20 tablet   0    BP 124/95  Pulse 99  Temp(Src) 97.9 F (36.6 C) (Oral)  Resp 18  Ht 5\' 5"  (1.651 m)  Wt 165 lb (74.844 kg)  BMI 27.46 kg/m2  SpO2 100%  LMP 02/17/2014 Physical Exam  Nursing note and vitals reviewed. Constitutional: She is oriented to person, place, and time. She appears well-developed and well-nourished. She appears distressed (she is calm, but uncomfortable).  HENT:  Head: Normocephalic and atraumatic.  Eyes: Conjunctivae and EOM are normal. Pupils are equal, round, and reactive to light.  Neck: Normal range of motion and phonation normal. Neck supple.  Cardiovascular: Normal rate, regular rhythm and intact distal pulses.   Pulmonary/Chest: Effort normal and breath sounds normal. No respiratory distress. She exhibits no tenderness.  Abdominal: Soft. She exhibits no distension and no mass. There is no tenderness. There is no rebound and no guarding.  Musculoskeletal: Normal range of motion. She exhibits no edema and no tenderness.   No large joint deformity  Neurological: She is alert and oriented to person, place, and time. She exhibits normal muscle tone.  Normal gait.  Skin: Skin is warm and dry.  Psychiatric: She has a normal mood and affect. Her behavior is normal. Judgment and thought content normal.    ED Course  Procedures (including critical care time)  19:40- The patient's husband asked that she be treated with pain medications and, in the emergency department  Medications  ondansetron (ZOFRAN-ODT) disintegrating tablet 8 mg (8 mg Oral Given 02/24/14 1757)  sodium chloride 0.9 % bolus 1,000 mL (0 mLs Intravenous Stopped 02/24/14 2026)  ketorolac (TORADOL) 30 MG/ML injection 30 mg (30 mg Intravenous Given 02/24/14 1951)  0.9 %  sodium chloride infusion (0 mLs Intravenous Stopped 02/24/14 2118)  LORazepam (ATIVAN) injection 1 mg (1 mg Intravenous Given 02/24/14 2032)    No  data found.   7:41 PM Reevaluation with update and discussion. After initial assessment and treatment, an updated evaluation reveals she states that she feels somewhat better. She would like to have some more IV fluids. A second liter is ordered. The patient's husband, again advocated for her, now asking for something to help her sleep, while she is waiting in the emergency department. [facility] Review Labs Reviewed  CBC WITH DIFFERENTIAL - Abnormal; Notable for the following:    Hemoglobin 10.6 (*)    HCT 33.7 (*)    MCV 74.2 (*)    MCH 23.3 (*)    Neutrophils Relative % 84 (*)    Lymphocytes Relative 10 (*)    All other components within normal limits  COMPREHENSIVE METABOLIC PANEL - Abnormal; Notable for the following:    Glucose, Bld 118 (*)    All other components within normal limits  LIPASE, BLOOD  URINALYSIS, ROUTINE W REFLEX MICROSCOPIC  PREGNANCY, URINE   Imaging Review No results found.   EKG Interpretation  Date/Time:    Ventricular Rate:    PR Interval:    QRS Duration:   QT Interval:    QTC Calculation:   R Axis:     Text Interpretation:         MDM   Final diagnoses:  Influenza    Nonspecific myalgia with signs and symptoms of influenza. Patient frequently comes to the ED for the conditions, and frequently is treated with narcotic pain relievers. He has had a gastric bypass. Long-term use of NSAIDs for chronic pain, after gastric bypass, is not indicated, however, short-term use of NSAIDs, does not appear contraindicated in this patient. She is at low risk for complications from influenza. She has shown improvement after treatment with symptomatic measures in the emergency department  Nursing Notes Reviewed/ Care Coordinated Applicable Imaging Reviewed Interpretation of Laboratory Data incorporated into ED treatment  The patient appears reasonably screened and/or stabilized for discharge and I doubt any other medical condition or other  [facility] requiring further screening, evaluation, or treatment in the ED at this time prior to discharge.  Plan: Home Medications- antipyretic prn; Home Treatments- rest, fluids; return here if the recommended treatment, does not improve the symptoms; Recommended follow up- PCP prn  Richarda Blade, MD 02/26/14 2118

## 2014-02-24 NOTE — ED Notes (Signed)
Pt discharged, can leave when fluids are finished.

## 2014-02-24 NOTE — ED Notes (Signed)
Pt alert & oriented x4, stable gait. Patient given discharge instructions, paperwork & prescription(s). Patient  instructed to stop at the registration desk to finish any additional paperwork. Patient verbalized understanding. Pt left department w/ no further questions. 

## 2014-03-15 ENCOUNTER — Emergency Department (HOSPITAL_COMMUNITY)

## 2014-03-15 ENCOUNTER — Encounter (HOSPITAL_COMMUNITY): Payer: Self-pay | Admitting: Emergency Medicine

## 2014-03-15 ENCOUNTER — Emergency Department (HOSPITAL_COMMUNITY)
Admission: EM | Admit: 2014-03-15 | Discharge: 2014-03-15 | Disposition: A | Discharge status: Home / Self Care | Attending: Emergency Medicine | Admitting: Emergency Medicine

## 2014-03-15 DIAGNOSIS — Y9301 Activity, walking, marching and hiking: Secondary | ICD-10-CM | POA: Insufficient documentation

## 2014-03-15 DIAGNOSIS — E669 Obesity, unspecified: Secondary | ICD-10-CM | POA: Insufficient documentation

## 2014-03-15 DIAGNOSIS — W1809XA Striking against other object with subsequent fall, initial encounter: Secondary | ICD-10-CM | POA: Diagnosis not present

## 2014-03-15 DIAGNOSIS — Z8659 Personal history of other mental and behavioral disorders: Secondary | ICD-10-CM | POA: Diagnosis not present

## 2014-03-15 DIAGNOSIS — S93609A Unspecified sprain of unspecified foot, initial encounter: Secondary | ICD-10-CM | POA: Insufficient documentation

## 2014-03-15 DIAGNOSIS — Z880 Allergy status to penicillin: Secondary | ICD-10-CM | POA: Insufficient documentation

## 2014-03-15 DIAGNOSIS — S99929A Unspecified injury of unspecified foot, initial encounter: Secondary | ICD-10-CM | POA: Diagnosis present

## 2014-03-15 DIAGNOSIS — Z8739 Personal history of other diseases of the musculoskeletal system and connective tissue: Secondary | ICD-10-CM | POA: Insufficient documentation

## 2014-03-15 DIAGNOSIS — Z8679 Personal history of other diseases of the circulatory system: Secondary | ICD-10-CM | POA: Insufficient documentation

## 2014-03-15 DIAGNOSIS — Y9289 Other specified places as the place of occurrence of the external cause: Secondary | ICD-10-CM | POA: Insufficient documentation

## 2014-03-15 DIAGNOSIS — S8990XA Unspecified injury of unspecified lower leg, initial encounter: Secondary | ICD-10-CM | POA: Diagnosis present

## 2014-03-15 DIAGNOSIS — S93504A Unspecified sprain of right lesser toe(s), initial encounter: Secondary | ICD-10-CM

## 2014-03-15 MED ORDER — HYDROCODONE-ACETAMINOPHEN 5-325 MG PO TABS: 1.0000 | ORAL_TABLET | Freq: Four times a day (QID) | ORAL | Status: DC | PRN

## 2014-03-15 MED ORDER — HYDROCODONE-ACETAMINOPHEN 5-325 MG PO TABS
1.0000 | ORAL_TABLET | Freq: Once | ORAL | Status: AC
  Administered 2014-03-15: 1 via ORAL
  Filled 2014-03-15: qty 1

## 2014-03-15 NOTE — ED Notes (Signed)
Ice pack applied to right foot.

## 2014-03-15 NOTE — ED Provider Notes (Signed)
CSN: [id number]     Arrival date & time 03/24/14  0043 History   First MD Initiated Contact with Patient 03/24/14 0309     Chief Complaint  Patient presents with  . Foot Injury     (Consider location/radiation/quality/duration/timing/severity/associated sxs/prior Treatment) HPI This is a 38 year old female who was walking down some stairs and slipped, landing on her buttocks. She slid down the remaining stairs and struck her right fourth toe on an object. She is now having moderate pain to the right fourth toe, worse with ambulation. There is associated ecchymosis. She attributes her fall due to chronic laxity of the left knee which she has not had looked into recently. She denies other injuries.  Past Medical History  Diagnosis Date  . Obesity     gastric Bypass, Roux-en Y  . Migraines   . Frozen shoulder syndrome   . Depression    Past Surgical History  Procedure Laterality Date  . Gastric bypass  04/20/2011    [place], wt 271lb preop  . Fertility surgery  2009    No abnormalities in female   Family History  Problem Relation Age of Onset  . Diabetes Father   . Hyperlipidemia Father   . Hypertension Father   . Cancer Father     skin cancer  . Depression Mother     lifelong  . Anesthesia problems Neg Hx   . Arthritis     History  Substance Use Topics  . Smoking status: Never Smoker   . Smokeless tobacco: Never Used  . Alcohol Use: No   OB History   Grav Para Term Preterm Abortions TAB SAB Ect Mult Living   3 3 3  0 0 0 0 0 0 3     Review of Systems  All other systems reviewed and are negative.    Allergies  Morphine and related; Nsaids; and Penicillins  Home Medications   Current Outpatient Rx  Name  Route  Sig  Dispense  Refill  . ondansetron (ZOFRAN) 8 MG tablet   Oral   Take 1 tablet (8 mg total) by mouth every 8 (eight) hours as needed for nausea or vomiting.   20 tablet   0    BP 111/71  Pulse 73  Temp(Src) 97.9 F (36.6 C) (Oral)   Resp 18  Ht 5\' 5"  (1.651 m)  Wt 165 lb (74.844 kg)  BMI 27.46 kg/m2  SpO2 100%  LMP 03/14/2014  Physical Exam General: Well-developed, well-nourished female in no acute distress; appearance consistent with age of record HENT: normocephalic; atraumatic Eyes: pupils equal, round and reactive to light; extraocular muscles intact Neck: supple Heart: regular rate and rhythm Lungs: Normal respiratory effort and excursion Abdomen: soft; nondistended Extremities: No deformity; full range of motion; pulses normal; tenderness and ecchymosis of right fourth toe without deformity; right foot neurovascularly intact; no tenderness or laxity of right ankle; mild laxity of left patellar ligament Neurologic: Awake, alert and oriented; motor function intact in all extremities and symmetric; no facial droop Skin: Warm and dry Psychiatric: Normal mood and affect    ED Course  Procedures (including critical care time)   MDM   Nursing notes and vitals signs, including pulse oximetry, reviewed.  Summary of this visit's results, reviewed by myself:  Labs:  No results found for this or any previous visit (from the past 24 hour(s)).  Imaging Studies: Dg Foot Complete Right  2014-03-24   CLINICAL DATA:  Injury.  Right foot pain.  EXAM: RIGHT  FOOT COMPLETE - 3+ VIEW  COMPARISON:  None.  FINDINGS: Imaged bones, joints and soft tissues appear normal.  IMPRESSION: Negative exam.   Electronically Signed   By: Inge Rise M.D.   On: 03/15/2014 01:29        Wynetta Fines, MD 03/15/14 [phone]

## 2014-03-15 NOTE — ED Notes (Signed)
Patient states she was walking down stairs and slipped and turned her right foot.  Bruising noted to right foot.

## 2014-03-16 MED FILL — Hydrocodone-Acetaminophen Tab 5-325 MG: ORAL | Qty: 6 | Status: AC

## 2014-03-18 ENCOUNTER — Other Ambulatory Visit: Payer: Self-pay | Admitting: Family Medicine

## 2014-03-18 NOTE — Telephone Encounter (Signed)
Medication called to pharmacy. 

## 2014-03-18 NOTE — Telephone Encounter (Signed)
Ok to refill??  Last office visit 08/20/2013.  Last refill 12/21/2013.

## 2014-03-18 NOTE — Telephone Encounter (Signed)
ok 

## 2014-03-23 ENCOUNTER — Ambulatory Visit (HOSPITAL_COMMUNITY)
Admission: RE | Admit: 2014-03-23 | Discharge: 2014-03-23 | Disposition: A | Discharge status: Home / Self Care | Source: Ambulatory Visit | Attending: Family Medicine | Admitting: Family Medicine

## 2014-03-23 ENCOUNTER — Ambulatory Visit (INDEPENDENT_AMBULATORY_CARE_PROVIDER_SITE_OTHER): Admitting: Family Medicine

## 2014-03-23 ENCOUNTER — Encounter: Payer: Self-pay | Admitting: Family Medicine

## 2014-03-23 VITALS — BP 130/74 | HR 82 | Temp 97.0°F | Resp 14 | Ht 65.0 in | Wt 165.0 lb

## 2014-03-23 DIAGNOSIS — M25569 Pain in unspecified knee: Secondary | ICD-10-CM

## 2014-03-23 DIAGNOSIS — M222X2 Patellofemoral disorders, left knee: Secondary | ICD-10-CM

## 2014-03-23 DIAGNOSIS — F411 Generalized anxiety disorder: Secondary | ICD-10-CM

## 2014-03-23 DIAGNOSIS — R29898 Other symptoms and signs involving the musculoskeletal system: Secondary | ICD-10-CM | POA: Insufficient documentation

## 2014-03-23 NOTE — Progress Notes (Signed)
   Subjective:    Patient ID: [name], female    DOB: 1976/06/12, 38 y.o.   MRN: [id number]  HPI Patient presents with several months of left knee weakness. She reports the left knee frequently gives out on her when she is going down steps.  This actually caused her to fall into the emergency room earlier this month. She complains of pain and tenderness under the inferior pole of patella. She denies any crepitus. She denies any erythema or effusion. She denies any inciting injury that triggered her knee pain. She does run a lot and this will be a risk for patellofemoral syndrome.  She also has generalized anxiety disorder. She uses Xanax sparingly for anxiety. She uses the medication, one pill every 2 or 3 days. She would like a refill on this medication to be used sparingly. Past Medical History  Diagnosis Date  . Obesity     gastric Bypass, Roux-en Y  . Migraines   . Frozen shoulder syndrome   . Depression    Current Outpatient Prescriptions on File Prior to Visit  Medication Sig Dispense Refill  . ALPRAZolam (XANAX) 0.5 MG tablet TAKE ONE TABLET BY MOUTH THREE TIMES DAILY AND AS NEEDED FOR SLEEP/ANXIETY  20 tablet  0   No current facility-administered medications on file prior to visit.   Allergies  Allergen Reactions  . Morphine And Related Other (See Comments)    Internal burning sensation post surgical prcedure  . Nsaids Other (See Comments)    Due to gastric bypass  . Penicillins Itching and Swelling   History   Social History  . Marital Status: Married    Spouse Name: N/A    Number of Children: 2  . Years of Education: 13   Occupational History  . 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 Smoking status: Never Smoker   . Smokeless tobacco: Never Used  . Alcohol Use: No  . Drug Use: No  . Sexual Activity: Yes    Partners: Male     Comment: single possible conception date July 22, 2011   Other Topics  Concern  . Not on file   Social History Narrative  . No narrative on file      Review of Systems  All other systems reviewed and are negative.       Objective:   Physical Exam  Vitals reviewed. Cardiovascular: Normal rate, regular rhythm and normal heart sounds.   Pulmonary/Chest: Effort normal and breath sounds normal.  Musculoskeletal:       Left knee: She exhibits normal range of motion, no swelling, no effusion, no ecchymosis, no deformity, no erythema, normal alignment, no LCL laxity, normal patellar mobility, no bony tenderness, normal meniscus and no MCL laxity. Tenderness found. Patellar tendon tenderness noted. No medial joint line and no lateral joint line tenderness noted.          Assessment & Plan:  1. Patellofemoral syndrome, left Began with an x-ray of the knee. If the x-rays of the knee are relatively normal, I would consult physical therapy for patellofemoral syndrome. - DG Knee Complete 4 Views Left; Future  2. GAD (generalized anxiety disorder) I will gladly refill the patient's Xanax as needed. Currently a prescription for 20 tablets is lasting 3-4 months.  She shows no signs of abuse or diversion.

## 2014-03-25 ENCOUNTER — Other Ambulatory Visit: Payer: Self-pay | Admitting: Family Medicine

## 2014-03-25 DIAGNOSIS — M25569 Pain in unspecified knee: Secondary | ICD-10-CM

## 2014-03-26 ENCOUNTER — Other Ambulatory Visit: Payer: Self-pay | Admitting: Advanced Practice Midwife

## 2014-03-26 ENCOUNTER — Other Ambulatory Visit: Payer: Self-pay | Admitting: Family Medicine

## 2014-03-26 ENCOUNTER — Telehealth: Payer: Self-pay | Admitting: Family Medicine

## 2014-03-26 NOTE — Telephone Encounter (Signed)
Ok refill? 

## 2014-03-26 NOTE — Telephone Encounter (Signed)
RX faxed to pharmacy.

## 2014-03-26 NOTE — Telephone Encounter (Deleted)
Request

## 2014-03-26 NOTE — Telephone Encounter (Signed)
Xanax rx was faxed earlier today

## 2014-03-31 ENCOUNTER — Other Ambulatory Visit: Payer: Self-pay | Admitting: Family Medicine

## 2014-03-31 MED ORDER — ALPRAZOLAM 0.5 MG PO TABS: ORAL_TABLET | ORAL | Status: DC

## 2014-04-02 ENCOUNTER — Ambulatory Visit (HOSPITAL_COMMUNITY)
Admission: RE | Admit: 2014-04-02 | Discharge: 2014-04-02 | Disposition: A | Discharge status: Home / Self Care | Source: Ambulatory Visit | Attending: Family Medicine | Admitting: Family Medicine

## 2014-04-02 DIAGNOSIS — M25562 Pain in left knee: Secondary | ICD-10-CM

## 2014-04-02 DIAGNOSIS — M6281 Muscle weakness (generalized): Secondary | ICD-10-CM | POA: Insufficient documentation

## 2014-04-02 DIAGNOSIS — M25669 Stiffness of unspecified knee, not elsewhere classified: Secondary | ICD-10-CM | POA: Insufficient documentation

## 2014-04-02 DIAGNOSIS — M25569 Pain in unspecified knee: Secondary | ICD-10-CM | POA: Insufficient documentation

## 2014-04-02 DIAGNOSIS — IMO0001 Reserved for inherently not codable concepts without codable children: Secondary | ICD-10-CM | POA: Insufficient documentation

## 2014-04-02 DIAGNOSIS — R269 Unspecified abnormalities of gait and mobility: Secondary | ICD-10-CM | POA: Insufficient documentation

## 2014-04-02 NOTE — Evaluation (Signed)
Physical Therapy Evaluation  Patient Details  Name: [name] MRN: [id number] Date of Birth: [birth year]  Today's Date: 04/02/2014 Time: 1520-1610 PT Time Calculation (min): 50 min    Charges: PT evaluation           Visit#: 1 of 12  Re-eval: 05/02/14 Assessment Diagnosis: knee pain  Next MD Visit: Dr. Dennard Schaumann  Prior Therapy: no  Authorization: [employer]     Authorization Time Period:    Authorization Visit#:   of     Past Medical History:  Past Medical History  Diagnosis Date  . Obesity     gastric Bypass, Roux-en Y  . Migraines   . Frozen shoulder syndrome   . Depression    Past Surgical History:  Past Surgical History  Procedure Laterality Date  . Gastric bypass  04/20/2011    [place], wt 271lb preop  . Fertility surgery  2009    No abnormalities in female    Subjective Symptoms/Limitations Symptoms: weakness noted  in left knee past few months, then  last week left knee buckled coming down steps, now hears joint noise going up and  down steps, had  been running (2 miles every other day) and using elliptical prior to onset , no current work outs, plus right 4th toe hurts from fall on stairs  Pertinent History: wants to re enlist in [employer] /[employer] , no prior knee injuries , basic x rays negative, dr suggested tendonits/ patellar femoral syndrome, last week was first episode of knee buckling, mother of 3 , 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 : Yes Is the patient reluctant to  leave their home because of a fear of falling? :  (bruised toe right foot )  Prior Functioning  Prior Function Level of Independence: Independent with basic ADLs Vocation: Student Leisure: Hobbies-yes (Comment) Comments: training for return to [employer]   Cognition/Observation Observation/Other Assessments Observations: B knee valgus R > left, left patella alta and lateral  Other Assessments: tender left knee inferionr pole, avoiding right toe off sequence per recent right toe injury   Sensation/Coordination/Flexibility/Functional Tests Flexibility 90/90: Positive Functional Tests Functional Tests: FOTO status 46% Functional Tests: standing squat increased knee valgus and poor form, improved with education  Functional Tests: impaired LE flexibility and balance to squat > 90 degrees   Assessment LLE Strength Left Hip Extension: 4/5 Left Hip ABduction: 4/5 Left Knee Flexion: 4/5 Left Knee Extension: 5/5 Palpation Palpation: tender left knee inferior patellar pole  Left hamstring flexibility 70 degrees  Mild increased laxity lachmans compared to R, Left SLS 10 sec with pain  Exercise/Treatment    Stretches Active Hamstring Stretch: 1 rep;30 seconds;Limitations Active Hamstring Stretch Limitations: standing chair left leg  Quad Stretch: 2 reps;30 seconds;Limitations Quad Stretch Limitations: standing  Gastroc Stretch: 1 rep;30 seconds    Standing Step Down: Left;1 set;Hand Hold: 0;10 reps;Step Height: 2" Other Standing Knee Exercises: wall squats30 -90 degrees 10x ( cues for proper knee alignment to avoid valgus )  S  Manual Therapy Manual Therapy: Myofascial release Myofascial Release: ice massage 2 min, patellar tendon cross friction 2 min , trial of kinesio tape wuth patellar tendon decompression strip and 2 surrounding  strips  25% tension   Physical Therapy Assessment and Plan PT Assessment and Plan Clinical Impression Statement: 38 year old female  referred for left patellar femoral syndrome with recent onset several month ago but recent fall a week ago due to knee buckling on stairs. her symtpoms are consistent with left knee tendonitis with pain during stair use, tender inferior left knee patellar pole, increased knee valgus upon squats. Her lachmans test revealed minor increased laxity L>R, her knee ROM WNL , strength WNL. Her quadriceps flexibility tested normal however  decreased hamstring flexibility. She can benefit most from proper progression with squats and lunges along with education on form and technique.  Pt will benefit from skilled therapeutic intervention in order to improve on the following deficits: Pain;Increased fascial restricitons;Decreased activity tolerance Rehab Potential: Good PT Frequency: Min 3X/week PT Duration: 4 weeks PT Treatment/Interventions: Gait training;Stair training;Functional mobility training;Therapeutic activities;Therapeutic exercise;Manual techniques;Patient/family education;Neuromuscular re-education;Modalities PT Plan: LE stretches, squats  pain free ranges and education on form ( use mirror if needed for feedback), ice massage , kinesio tape PRN, patelllar tendon cross friction massage , knee conditioning program     Goals PT Short Term Goals Time to Complete Short Term Goals: 2 weeks PT Short Term Goal 1: assist patient to develop a active fitness routine at home without increase knee pain to start/restart traing for [employer]  PT Short Term Goal 2: pateint able to step up and down 4 inch step pain free   PT Long Term Goals Time to Complete Long Term Goals: 4 weeks PT Long Term Goal 1: pateint able to ascend and descend flgiht of stairs  with 1 handrail light contact  use pain  free  PT Long Term Goal 2: patient able to jog 1 mile without knee symptoms during or after  Long Term Goal 3: pateint to demonstrate  pain free squats and lunges with proper form /hip and knee alignment  for decrease  knee strain during work outs    Problem List Patient Active Problem List   Diagnosis Date Noted  . Knee pain, left 04/02/2014  . Shoulder pain 11/18/2012  . Pain in joint, shoulder region 09/03/2012  . Muscle weakness (generalized) 09/03/2012  . 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 Pregnancy complicated by previous gastric bypass, antepartum 10/05/2011    Class: Present on Admission  . Obesity     PT - End of Session Activity Tolerance: Patient tolerated treatment well General Behavior During Therapy: WFL for tasks assessed/performed PT Plan of Care PT Patient Instructions: ice massage, educated on kinesio tape, HEP for hamstring stretch 30 sec x 2, wall runners stretch 30 sec x 2, sqauts 2 sets of ten using wall 30 -90 degrees  Consulted and Agree with Plan of Care: Patient  GP Functional Assessment Tool Used: foto score 46%, mobility impairment   Evalette Montrose 04/02/2014, 5:16 PM  Physician Documentation Your signature is required to indicate approval of the treatment plan as stated above.  Please sign and either send electronically or make a copy of this report for your files and return this physician signed original.   Please mark one 1.__approve of plan  2. ___approve of plan with the following conditions.   ______________________________  _____________________ Physician Signature                                                                                                             Date

## 2014-04-05 ENCOUNTER — Ambulatory Visit (HOSPITAL_COMMUNITY): Admitting: *Deleted

## 2014-04-08 ENCOUNTER — Inpatient Hospital Stay (HOSPITAL_COMMUNITY): Admission: RE | Admit: 2014-04-08 | Source: Ambulatory Visit | Admitting: *Deleted

## 2014-04-20 ENCOUNTER — Encounter: Payer: Self-pay | Admitting: Family Medicine

## 2014-04-20 ENCOUNTER — Ambulatory Visit (INDEPENDENT_AMBULATORY_CARE_PROVIDER_SITE_OTHER): Admitting: Family Medicine

## 2014-04-20 ENCOUNTER — Other Ambulatory Visit: Payer: Self-pay | Admitting: Family Medicine

## 2014-04-20 VITALS — BP 104/68 | HR 80 | Temp 97.8°F | Resp 14 | Ht 65.0 in | Wt 162.0 lb

## 2014-04-20 DIAGNOSIS — R413 Other amnesia: Secondary | ICD-10-CM

## 2014-04-20 LAB — CBC WITH DIFFERENTIAL/PLATELET
BASOS ABS: 0 10*3/uL (ref 0.0–0.1)
BASOS PCT: 1 % (ref 0–1)
EOS PCT: 5 % (ref 0–5)
Eosinophils Absolute: 0.2 10*3/uL (ref 0.0–0.7)
HCT: 29.3 % — ABNORMAL LOW (ref 36.0–46.0)
Hemoglobin: 9.3 g/dL — ABNORMAL LOW (ref 12.0–15.0)
Lymphocytes Relative: 38 % (ref 12–46)
Lymphs Abs: 1.8 10*3/uL (ref 0.7–4.0)
MCH: 22.2 pg — AB (ref 26.0–34.0)
MCHC: 31.7 g/dL (ref 30.0–36.0)
MCV: 69.9 fL — AB (ref 78.0–100.0)
Monocytes Absolute: 0.4 10*3/uL (ref 0.1–1.0)
Monocytes Relative: 8 % (ref 3–12)
NEUTROS PCT: 48 % (ref 43–77)
Neutro Abs: 2.3 10*3/uL (ref 1.7–7.7)
Platelets: 302 10*3/uL (ref 150–400)
RBC: 4.19 MIL/uL (ref 3.87–5.11)
RDW: 15.9 % — AB (ref 11.5–15.5)
WBC: 4.8 10*3/uL (ref 4.0–10.5)

## 2014-04-20 NOTE — Progress Notes (Signed)
Subjective:    Patient ID: [name], female    DOB: May 16, 1976, 38 y.o.   MRN: [id number]  HPI Patient is a very pleasant 38 year old white female who presents to complaining of memory problems. States it last few weeks she's noticed problems remembering important things.  For instance, she forgot her address. She states that it was not a phenomenon where was on the tip of her tongue but rather she cannot remember her address and to her husband reminded her. She also recently forgot to go to class and work one day.  She has been under increasing stress recently mainly financial. She denies depression. She denies anhedonia. She does report insomnia. She reports fatigue. She denies excessive feelings of guilt. She denies mania. She denies suicidal ideation. She denies auditory or visual hallucinations. She does have a significant history of attention problems even as a child. She has a difficult time maintaining her focus on daily activities. She has a difficult time listening and conversations. She avoids activities that require sustained mental effort. She sometimes has problems organizing tasks. She is often easily distracted by extraneous stimuli. She is often forgetful in daily activities. She has no symptoms of hyperactivity. She denies any neurologic deficits. She denies any double vision or blurry vision. She denies any paresthesias numbness or weakness in her limbs. She denies any other symptoms of multiple sclerosis. She denies any seizure activity. She does have migraines. Past Medical History  Diagnosis Date  . Obesity     gastric Bypass, Roux-en Y  . Migraines   . Frozen shoulder syndrome   . Depression    Past Surgical History  Procedure Laterality Date  . Gastric bypass  04/20/2011    [place], [place] 271lb preop  . Fertility surgery  2009    No abnormalities in female   Current Outpatient Prescriptions on File Prior to Visit  Medication Sig Dispense Refill  .  ACCU-CHEK AVIVA PLUS test strip USE ONE STRIP TO CHECK FASTING BLOOD SUGAR AND 2 HOURS AFTER MEALS  100 each  6  . ACCU-CHEK FASTCLIX LANCETS MISC USE LANCET TO CHECK FASTING BLOOD SUGAR AND 2 HOURS AFTER MEALS  102 each  0  . ALPRAZolam (XANAX) 0.5 MG tablet TAKE ONE TABLET BY MOUTH THREE TIMES DAILY AS NEEDED FOR SLEEP AND ANXIETY  20 tablet  0   No current facility-administered medications on file prior to visit.   Allergies  Allergen Reactions  . Morphine And Related Other (See Comments)    Internal burning sensation post surgical prcedure  . Nsaids Other (See Comments)    Due to gastric bypass  . Penicillins Itching and Swelling   History   Social History  . Marital Status: Married    Spouse Name: N/A    Number of Children: 2  . Years of Education: 13   Occupational History  . 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 Smoking status: Never Smoker   . Smokeless tobacco: Never Used  . Alcohol Use: No  . Drug Use: No  . Sexual Activity: Yes    Partners: Male     Comment: single possible conception date July 22, 2011   Other Topics Concern  . Not on file   Social History Narrative  . No narrative on file      Review of Systems  All other systems reviewed and are negative.      Objective:  Physical Exam  Vitals reviewed. Constitutional: She is oriented to person, place, and time. She appears well-developed and well-nourished. No distress.  HENT:  Head: Normocephalic and atraumatic.  Right Ear: External ear normal.  Left Ear: External ear normal.  Nose: Nose normal.  Mouth/Throat: Oropharynx is clear and moist. No oropharyngeal exudate.  Eyes: Conjunctivae and EOM are normal. Pupils are equal, round, and reactive to light. No scleral icterus.  Neck: Neck supple. No thyromegaly present.  Cardiovascular: Normal rate, regular rhythm and normal heart sounds.   No murmur heard. Pulmonary/Chest: Effort normal  and breath sounds normal.  Lymphadenopathy:    She has no cervical adenopathy.  Neurological: She is alert and oriented to person, place, and time. She has normal reflexes. She displays normal reflexes. No cranial nerve deficit. She exhibits normal muscle tone. Coordination normal.  Skin: She is not diaphoretic.  Psychiatric: She has a normal mood and affect. Her behavior is normal. Judgment and thought content normal.          Assessment & Plan:  1. Memory loss I obtained a CBC, CMP, TSH, vitamin B12, and sedimentation rate. There is no sign of seizure disorder multiple sclerosis. I do not believe there is a serious underlying medical issue causing her memory loss. I tried to reassure the patient this. I do believe that she may be dealing with stress anxiety and maybe even some mild depression compounded by a likely underlying attention deficit problem. However I would not recommend treating at this time. Rather I would recommend clinical monitoring. If her symptoms worsen I would proceed with an MRI of the brain. - CBC with Differential - COMPLETE METABOLIC PANEL WITH GFR - TSH - Vitamin B12 - Sedimentation rate

## 2014-04-21 LAB — COMPLETE METABOLIC PANEL WITH GFR
ALK PHOS: 68 U/L (ref 39–117)
ALT: 9 U/L (ref 0–35)
AST: 14 U/L (ref 0–37)
Albumin: 4 g/dL (ref 3.5–5.2)
BILIRUBIN TOTAL: 0.3 mg/dL (ref 0.2–1.2)
BUN: 11 mg/dL (ref 6–23)
CO2: 27 mEq/L (ref 19–32)
CREATININE: 0.6 mg/dL (ref 0.50–1.10)
Calcium: 8.7 mg/dL (ref 8.4–10.5)
Chloride: 102 mEq/L (ref 96–112)
GFR, Est African American: >89 mL/min
GFR, Est Non African American: >89 mL/min
Glucose, Bld: 71 mg/dL (ref 70–99)
Potassium: 4.3 mEq/L (ref 3.5–5.3)
SODIUM: 140 meq/L (ref 135–145)
Total Protein: 6.3 g/dL (ref 6.0–8.3)

## 2014-04-21 LAB — TSH: TSH: 0.97 u[IU]/mL (ref 0.350–4.500)

## 2014-04-21 LAB — VITAMIN B12: VITAMIN B 12: 527 pg/mL (ref 211–911)

## 2014-04-21 LAB — SEDIMENTATION RATE: SED RATE: 5 mm/h (ref 0–22)

## 2014-04-22 ENCOUNTER — Other Ambulatory Visit: Payer: Self-pay | Admitting: *Deleted

## 2014-04-22 DIAGNOSIS — D508 Other iron deficiency anemias: Secondary | ICD-10-CM

## 2014-04-22 MED ORDER — FERROUS SULFATE 325 (65 FE) MG PO TBEC: 325.0000 mg | DELAYED_RELEASE_TABLET | Freq: Two times a day (BID) | ORAL | Status: DC

## 2014-04-23 LAB — IRON: IRON: 18 ug/dL — AB (ref 42–145)

## 2014-04-30 ENCOUNTER — Other Ambulatory Visit: Payer: Self-pay | Admitting: Family Medicine

## 2014-04-30 ENCOUNTER — Encounter (HOSPITAL_COMMUNITY): Payer: Self-pay | Admitting: Emergency Medicine

## 2014-04-30 ENCOUNTER — Emergency Department (HOSPITAL_COMMUNITY)
Admission: EM | Admit: 2014-04-30 | Discharge: 2014-04-30 | Disposition: A | Discharge status: Home / Self Care | Attending: Emergency Medicine | Admitting: Emergency Medicine

## 2014-04-30 DIAGNOSIS — X500XXA Overexertion from strenuous movement or load, initial encounter: Secondary | ICD-10-CM | POA: Diagnosis not present

## 2014-04-30 DIAGNOSIS — Z880 Allergy status to penicillin: Secondary | ICD-10-CM | POA: Diagnosis not present

## 2014-04-30 DIAGNOSIS — S99919A Unspecified injury of unspecified ankle, initial encounter: Principal | ICD-10-CM

## 2014-04-30 DIAGNOSIS — Z862 Personal history of diseases of the blood and blood-forming organs and certain disorders involving the immune mechanism: Secondary | ICD-10-CM | POA: Diagnosis not present

## 2014-04-30 DIAGNOSIS — Y929 Unspecified place or not applicable: Secondary | ICD-10-CM | POA: Insufficient documentation

## 2014-04-30 DIAGNOSIS — Z8679 Personal history of other diseases of the circulatory system: Secondary | ICD-10-CM | POA: Diagnosis not present

## 2014-04-30 DIAGNOSIS — Z8739 Personal history of other diseases of the musculoskeletal system and connective tissue: Secondary | ICD-10-CM | POA: Insufficient documentation

## 2014-04-30 DIAGNOSIS — Z8639 Personal history of other endocrine, nutritional and metabolic disease: Secondary | ICD-10-CM | POA: Diagnosis not present

## 2014-04-30 DIAGNOSIS — G8929 Other chronic pain: Secondary | ICD-10-CM | POA: Insufficient documentation

## 2014-04-30 DIAGNOSIS — Y9389 Activity, other specified: Secondary | ICD-10-CM | POA: Diagnosis not present

## 2014-04-30 DIAGNOSIS — Z8659 Personal history of other mental and behavioral disorders: Secondary | ICD-10-CM | POA: Insufficient documentation

## 2014-04-30 DIAGNOSIS — S99929A Unspecified injury of unspecified foot, initial encounter: Principal | ICD-10-CM

## 2014-04-30 DIAGNOSIS — S8990XA Unspecified injury of unspecified lower leg, initial encounter: Secondary | ICD-10-CM | POA: Insufficient documentation

## 2014-04-30 HISTORY — DX: Patellofemoral disorders, left knee: M22.2X2

## 2014-04-30 HISTORY — DX: Anxiety disorder, unspecified: F41.9

## 2014-04-30 HISTORY — DX: Pain in left shoulder: M25.512

## 2014-04-30 HISTORY — DX: Cervicalgia: M54.2

## 2014-04-30 HISTORY — DX: Other chronic pain: G89.29

## 2014-04-30 HISTORY — DX: Pain in unspecified knee: M25.569

## 2014-04-30 MED ORDER — HYDROCODONE-ACETAMINOPHEN 5-325 MG PO TABS: 2.0000 | ORAL_TABLET | ORAL | Status: DC | PRN

## 2014-04-30 MED ORDER — OXYCODONE-ACETAMINOPHEN 5-325 MG PO TABS: 1.0000 | ORAL_TABLET | Freq: Four times a day (QID) | ORAL | Status: DC | PRN

## 2014-04-30 MED ORDER — HYDROCODONE-ACETAMINOPHEN 5-325 MG PO TABS: 1.0000 | ORAL_TABLET | ORAL | Status: DC | PRN

## 2014-04-30 NOTE — ED Provider Notes (Signed)
CSN: [id number]     Arrival date & time 04/30/14  2106 History   First MD Initiated Contact with Patient 04/30/14 2131     Chief Complaint  Patient presents with  . Knee Pain     (Consider location/radiation/quality/duration/timing/severity/associated sxs/prior Treatment) Patient is a 38 y.o. female presenting with knee pain. The history is provided by the patient.  Knee Pain Location:  Knee Time since incident:  10 hours Injury: yes   Knee location:  L knee Pain details:    Quality:  Sharp   Radiates to:  Does not radiate   Severity:  Severe   Onset quality:  Sudden   Timing:  Constant   Progression:  Unchanged Chronicity:  New Dislocation: no   Foreign body present:  No foreign bodies Prior injury to area:  No Relieved by:  Nothing Worsened by:  Activity and bearing weight Ineffective treatments:  Acetaminophen Associated symptoms: no numbness, no swelling and no tingling    [name] is a 38 y.o. female who presents to the ED with left knee pain. She was here about a month ago with a foot injury because her knee gave out on her. She has had knee problems for 3 months. She has been to there PCP. She has been to PT for the left knee. Today she was handing her son and bent forward and felt a ripping feeling in the left knee. She has taken tylenol and rested but the pain continues.   Past Medical History  Diagnosis Date  . Obesity     gastric Bypass, Roux-en Y  . Migraines   . Frozen shoulder syndrome   . Depression   . Chronic knee pain   . Chronic left shoulder pain   . Chronic neck pain   . Patellofemoral syndrome, left   . Anxiety    Past Surgical History  Procedure Laterality Date  . Gastric bypass  04/20/2011    [place], wt 271lb preop  . Fertility surgery  2009    No abnormalities in female   Family History  Problem Relation Age of Onset  . Diabetes Father   . Hyperlipidemia Father   . Hypertension Father   . Cancer Father     skin  cancer  . Depression Mother     lifelong  . Anesthesia problems Neg Hx   . Arthritis     History  Substance Use Topics  . Smoking status: Never Smoker   . Smokeless tobacco: Never Used  . Alcohol Use: No   OB History   Grav Para Term Preterm Abortions TAB SAB Ect Mult Living   3 3 3  0 0 0 0 0 0 3     Review of Systems Negative except as stated in HPI   Allergies  Morphine and related; Nsaids; and Penicillins  Home Medications   Prior to Admission medications   Medication Sig Start Date End Date Taking? Authorizing Provider  acetaminophen (TYLENOL) 500 MG tablet Take 500 mg by mouth every 6 (six) hours as needed.   Yes Historical Provider, MD   BP 103/64  Pulse 80  Temp(Src) 98.6 F (37 C) (Oral)  Resp 18  Ht 5\' 5"  (1.651 m)  Wt 150 lb (68.04 kg)  BMI 24.96 kg/m2  SpO2 98%  LMP 04/11/2014  Breastfeeding? Yes Physical Exam  Nursing note and vitals reviewed. Constitutional: She is oriented to person, place, and time. She appears well-developed and well-nourished. No distress.  HENT:  Head: Normocephalic.  Eyes: EOM are normal.  Neck: Neck supple.  Pulmonary/Chest: Effort normal.  Abdominal: Soft. There is no tenderness.  Musculoskeletal:       Left knee: She exhibits normal range of motion, no swelling, no deformity, no laceration, no erythema and normal alignment. Tenderness found. Patellar tendon tenderness noted.       Legs: Pedal pulses equal, adequate circulation, good touch sensation.   Neurological: She is alert and oriented to person, place, and time. No cranial nerve deficit.  Skin: Skin is warm and dry.  Psychiatric: She has a normal mood and affect. Her behavior is normal.    ED Course  Procedures   MDM  38 y.o. female with left knee pain after she she felt a tearing feeling in it earlier today. Patient placed in knee immobilizer, ice, crutches, pain management. Stable for discharge and remains neurovascularly intact. She will follow up with  ortho. She will return here as needed for worsening symptoms. Discussed with the patient possible tendon injury and that she may need an MRI for further evaluation. All questioned fully answered.   Medication List    TAKE these medications       HYDROcodone-acetaminophen 5-325 MG per tablet  Commonly known as:  NORCO/VICODIN  Take 1 tablet by mouth every 4 (four) hours as needed.     HYDROcodone-acetaminophen 5-325 MG per tablet  Commonly known as:  NORCO/VICODIN  Take 2 tablets by mouth every 4 (four) hours as needed.      ASK your doctor about these medications       acetaminophen 500 MG tablet  Commonly known as:  TYLENOL  Take 500 mg by mouth every 6 (six) hours as needed.         [facility] Bunnie Pion, [place] 05/01/14 [phone]

## 2014-04-30 NOTE — ED Notes (Signed)
Lt knee pain , seen here recently for knee problem, today bent over and felt pain in knee

## 2014-05-03 MED FILL — Hydrocodone-Acetaminophen Tab 5-325 MG: ORAL | Qty: 6 | Status: AC

## 2014-05-03 NOTE — Telephone Encounter (Signed)
?   OK to Refill  

## 2014-05-03 NOTE — Telephone Encounter (Signed)
ok 

## 2014-05-04 ENCOUNTER — Other Ambulatory Visit: Payer: Self-pay | Admitting: Family Medicine

## 2014-05-05 NOTE — ED Provider Notes (Signed)
Medical screening examination/treatment/procedure(s) were performed by non-physician practitioner and as supervising physician I was immediately available for consultation/collaboration.   Babette Relic, MD 05/05/14 2221

## 2014-05-18 ENCOUNTER — Ambulatory Visit: Payer: Managed Care, Other (non HMO) | Admitting: Orthopedic Surgery

## 2014-05-18 ENCOUNTER — Ambulatory Visit: Admitting: Advanced Practice Midwife

## 2014-05-20 NOTE — Progress Notes (Signed)
  Patient Details  Name: [name] MRN: [id number] Date of Birth: 11/24/1976  Today's Date: 05/20/2014 Pt never returned after initial evaluation discharge pt   [name] 05/20/2014, 4:12 PM

## 2014-05-20 NOTE — Addendum Note (Signed)
Encounter addended by: [name], PT on: 05/20/2014  4:13 PM<BR>     Documentation filed: Notes Section, Episodes

## 2014-05-20 NOTE — Addendum Note (Signed)
Encounter addended by: Debby Bud, OT on: 05/20/2014 11:49 AM<BR>     Documentation filed: Episodes

## 2014-06-03 ENCOUNTER — Ambulatory Visit (INDEPENDENT_AMBULATORY_CARE_PROVIDER_SITE_OTHER): Admitting: Orthopedic Surgery

## 2014-06-03 ENCOUNTER — Ambulatory Visit (INDEPENDENT_AMBULATORY_CARE_PROVIDER_SITE_OTHER)

## 2014-06-03 DIAGNOSIS — M25569 Pain in unspecified knee: Secondary | ICD-10-CM

## 2014-06-03 DIAGNOSIS — M25562 Pain in left knee: Secondary | ICD-10-CM

## 2014-06-03 NOTE — Progress Notes (Signed)
Patient ID: [name], female   DOB: 1976/11/23, 38 y.o.   MRN: [id number]  Chief Complaint  Patient presents with  . Knee Pain    Left knee pain for 3 months    8 rolled female had 2 injuries to her knee over the last 3 months. She felt some discomfort or irregularity behind the kneecap and then had a fall she eventually went to the emergency room after a second fall and were knee immobilizer for a diagnosis of partially torn patellar tendon. However she only would this for 4 days and return to her normal state of health until she had recurrent symptoms of pain and giving way. She has sharp throbbing discomfort behind the kneecap she rates it 8/10 she's taken ibuprofen it's worse when she goes up and down the stairs and nothing has made it better. Review of systems is negative she except for headaches and memory loss anxiety  She has a history of frozen shoulder and anemia  She had fertility surgery she had a Roux-en-Y gastric bypass she takes iron  Allergies penicillin anti-inflammatories cause ulcers oats not an allergy she reports morphine causes increased pain which is a different reaction to penicillin causes itching and swelling  Family history of diabetes ulcers cancer arthritis osteoporosis depression mental illness both parents are alive   General the patient is well-developed and well-nourished grooming and hygiene are normal Oriented x3 Mood and affect normal Ambulation  normal Inspection of the right knee, no tenderness or crepitance on patellar compression without pain Full range of motion All joints are stable Motor exam is normal Skin clean dry and intact  Left knee tenderness in the patella crepitance and pain with patellar compression Full range of motion Ligament stable Skin normal Motor exam grade 5  Cardiovascular exam is normal Sensory exam normal   X-rays negative  Encounter Diagnoses  Name Primary?  . Left knee pain   . Left anterior knee  pain Yes    Recommend injection  Physical therapy

## 2014-06-03 NOTE — Patient Instructions (Addendum)
Call to arrange therapy at [facility]  Joint Injection  Care After  Refer to this sheet in the next few days. These instructions provide you with information on caring for yourself after you have had a joint injection. Your caregiver also may give you more specific instructions. Your treatment has been planned according to current medical practices, but problems sometimes occur. Call your caregiver if you have any problems or questions after your procedure.  After any type of joint injection, it is not uncommon to experience:  Soreness, swelling, or bruising around the injection site.  Mild numbness, tingling, or weakness around the injection site caused by the numbing medicine used before or with the injection. It also is possible to experience the following effects associated with the specific agent after injection:  Iodine-based contrast agents:  Allergic reaction (itching, hives, widespread redness, and swelling beyond the injection site).  Corticosteroids (These effects are rare.):  Allergic reaction.  Increased blood sugar levels (If you have diabetes and you notice that your blood sugar levels have increased, notify your caregiver).  Increased blood pressure levels.  Mood swings.  Hyaluronic acid in the use of viscosupplementation.  Temporary heat or redness.  Temporary rash and itching.  Increased fluid accumulation in the injected joint. These effects all should resolve within a day after your procedure.  HOME CARE INSTRUCTIONS  Limit yourself to light activity the day of your procedure. Avoid lifting heavy objects, bending, stooping, or twisting.  Take prescription or over-the-counter pain medication as directed by your caregiver.  You may apply ice to your injection site to reduce pain and swelling the day of your procedure. Ice may be applied 3-4 times:  Put ice in a plastic bag.  Place a towel between your skin and the bag.  Leave the ice on for no longer than 15-20 minutes each  time. SEEK IMMEDIATE MEDICAL CARE IF:  Pain and swelling get worse rather than better or extend beyond the injection site.  Numbness does not go away.  Blood or fluid continues to leak from the injection site.  You have chest pain.  You have swelling of your face or tongue.  You have trouble breathing or you become dizzy.  You develop a fever, chills, or severe tenderness at the injection site that last longer than 1 day. MAKE SURE YOU:  Understand these instructions.  Watch your condition.  Get help right away if you are not doing well or if you get worse. Document Released: 08/30/2011 Document Revised: 03/10/2012 Document Reviewed: 08/30/2011  ExitCare Patient Information 2014 [employer]   

## 2014-06-15 ENCOUNTER — Telehealth: Payer: Self-pay | Admitting: Orthopedic Surgery

## 2014-06-15 NOTE — Telephone Encounter (Signed)
Patient called and requested that Dr. Lemmie Evens give her a pain med for her knee to have so she can take it before her therapy on 06/17/2014. Please Advise. Patient phone is [phone]

## 2014-06-15 NOTE — Telephone Encounter (Signed)
Routing to Dr Harrison 

## 2014-06-16 ENCOUNTER — Other Ambulatory Visit: Payer: Self-pay | Admitting: *Deleted

## 2014-06-16 DIAGNOSIS — M25562 Pain in left knee: Secondary | ICD-10-CM

## 2014-06-16 MED ORDER — TRAMADOL HCL 50 MG PO TABS: 50.0000 mg | ORAL_TABLET | Freq: Four times a day (QID) | ORAL | Status: DC | PRN

## 2014-06-16 NOTE — Telephone Encounter (Signed)
Tramadol  Final answer

## 2014-06-16 NOTE — Telephone Encounter (Signed)
Faxed prescription for Tramadol 50 mg to [facility]. Patient aware.

## 2014-06-17 ENCOUNTER — Ambulatory Visit (HOSPITAL_COMMUNITY)
Admission: RE | Admit: 2014-06-17 | Discharge: 2014-06-17 | Disposition: A | Discharge status: Home / Self Care | Source: Ambulatory Visit | Attending: Family Medicine | Admitting: Family Medicine

## 2014-06-17 DIAGNOSIS — M6281 Muscle weakness (generalized): Secondary | ICD-10-CM | POA: Insufficient documentation

## 2014-06-17 DIAGNOSIS — R269 Unspecified abnormalities of gait and mobility: Secondary | ICD-10-CM | POA: Insufficient documentation

## 2014-06-17 DIAGNOSIS — M25669 Stiffness of unspecified knee, not elsewhere classified: Secondary | ICD-10-CM | POA: Diagnosis not present

## 2014-06-17 DIAGNOSIS — M25659 Stiffness of unspecified hip, not elsewhere classified: Secondary | ICD-10-CM

## 2014-06-17 DIAGNOSIS — M25569 Pain in unspecified knee: Secondary | ICD-10-CM | POA: Insufficient documentation

## 2014-06-17 DIAGNOSIS — IMO0001 Reserved for inherently not codable concepts without codable children: Secondary | ICD-10-CM | POA: Insufficient documentation

## 2014-06-17 NOTE — Evaluation (Signed)
Physical Therapy Evaluation  Patient Details  Name: [name] MRN: [id number] Date of Birth: [birth year]  Today's Date: 06/17/2014 Time: 1300-1345 PT Time Calculation (min): 45 min              Visit#: 1 of 16  Re-eval: 07/17/14 Assessment Diagnosis: Lt knee Next MD Visit: Dr. Wylie Hail Prior Therapy: yes following fall, diffierent issue.   Authorization: [employer]     Authorization Time Period:    Authorization Visit#:   of     Past Medical History:  Past Medical History  Diagnosis Date  . Obesity     gastric Bypass, Roux-en Y  . Migraines   . Frozen shoulder syndrome   . Depression   . Chronic knee pain   . Chronic left shoulder pain   . Chronic neck pain   . Patellofemoral syndrome, left   . Anxiety    Past Surgical History:  Past Surgical History  Procedure Laterality Date  . Gastric bypass  04/20/2011    [place], wt 271lb preop  . Fertility surgery  2009    No abnormalities in female    Subjective Symptoms/Limitations Symptoms: Lt anteiror knee pain Pertinent History: Patient p/c is Lt anteriro knee pain. patient seen previously for knee pain secodnary to a fall. Patient recently performed a lungign position and felt/heard crunching and ripping in lt knee, patient went to ER and it was determiened it may be a partial tear. Patient recently saw MD [name] who determined knee had arthritis and he determiend theire were no signs/symptoms consistent with a tear. Fertiltiy surgery in 2010 and gastric bypass surgery in 2012. Patient is anemic.  Patient has been predominantly withotu pain since cortizone injection.  How long can you sit comfortably?: no How long can you stand comfortably?: no How long can you walk comfortably?: no Patient Stated Goals: To return to running and climbing up and down stairs withotu pain. Patient wants to return to [employer]  Pain Assessment Currently in Pain?: No/denies Pain Score: 0-No pain Pain Location: Knee Pain  Orientation: Left Pain Type: Chronic pain Pain Onset: More than a month ago Pain Frequency: Intermittent (with activity)  Cognition/Observation Observation/Other Assessments Observations: Gait: excessive idfoot pronation, excessive knee valgus, limited tibiaql internal rotation,  Other Assessments: FOTO: 8% status, 52% limitation  Sensation/Coordination/Flexibility/Functional Tests Flexibility Thomas: Positive Obers: Negative 90/90: Positive Functional Tests Functional Tests: Rinning: Lt knee excessive valgus moment compared to Rt Functional Tests: Single leg squat: Rt 95degrees lt 78 degrees  Assessment LLE PROM (degrees) Left Hip External Rotation : 50 Left Hip Internal Rotation : 40 Left Ankle Dorsiflexion: 16 LLE Strength Left Hip Flexion: 5/5 Left Hip Extension:  (4-/5) Left Hip ABduction: 3+/5 Left Hip ADduction: 4/5 Left Knee Flexion: 4/5 Left Knee Extension:  (4+/5) Left Ankle Dorsiflexion: 5/5  Physical Therapy Assessment and Plan PT Assessment and Plan Clinical Impression Statement: Patient displays Lt anteriror knee pain secondary to hip and knee stiffness and Lt LE weakness. Patient demosntrated running which with excessive valgus moment of Lt knee during loding phase of gait. Patient will benefit from skilled phsycial therapy to improve hamstriv and rectus femoris flexibility to decrease strain on knee  to loadign acitvities and benefit from increased Lt LE strengtheign to improve glut med, max and hamstring stregnth so patient can better tolerate loadign phase of  gait with imprved mechanics.  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  PT Short Term Goal 2: patient will perform single leg squat depth of 120 degrees indicating good LE strength and patient ready to return to running PT Short Term Goal 3: patient will perform single leg squat endurance test of 20 in 60 seconds indicating good LE endurance and patient ready to return to running PT Short Term Goal 4: Patient will dissplay negative 90/90 hamstring test and negative thomas test indicating improved stride length during runingn and walking PT Long Term Goals Time to Complete Long Term Goals: 8 weeks PT Long Term Goal 1: Patient will display glut med/min strength of 5/5 so patient runs with only minor knee valgus moment.  PT Long Term Goal 2: patient able to jog 1 mile without knee symptoms during or after run Long Term Goal 3: patient will demosntrate 3x single leg hop for distance equal blataterly indicatign equal LE power an patient ready to return to sport  Problem List Patient Active Problem List   Diagnosis Date Noted  . Stiffness of joint, not elsewhere classified, pelvic region and thigh 06/17/2014  . Left anterior knee pain 06/03/2014  . Knee pain, left 04/02/2014  . Shoulder pain 11/18/2012  . Pain in joint, shoulder region 09/03/2012  . Muscle weakness (generalized) 09/03/2012  . 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 Pregnancy complicated by previous gastric bypass, antepartum 10/05/2011    Class: Present on Admission  . Obesity     PT - End of Session Activity Tolerance: Patient tolerated treatment well General Behavior During Therapy: WFL for tasks assessed/performed  GP    DeWitt, Cash R 06/17/2014, 6:29 PM  Physician Documentation Your signature is required to  indicate approval of the treatment plan as stated above.  Please sign and either send electronically or make a copy of this report for your files and return this physician signed original.   Please mark one 1.__approve of plan  2. ___approve of plan with the following conditions.   ______________________________                                                          _____________________ Physician Signature                                                                                                             Date SBNR

## 2014-06-22 ENCOUNTER — Inpatient Hospital Stay (HOSPITAL_COMMUNITY): Admission: RE | Admit: 2014-06-22 | Source: Ambulatory Visit | Admitting: Physical Therapy

## 2014-06-24 ENCOUNTER — Ambulatory Visit (HOSPITAL_COMMUNITY)
Admission: RE | Admit: 2014-06-24 | Discharge: 2014-06-24 | Disposition: A | Discharge status: Home / Self Care | Source: Ambulatory Visit | Attending: Family Medicine | Admitting: Family Medicine

## 2014-06-24 ENCOUNTER — Telehealth: Payer: Self-pay | Admitting: Orthopedic Surgery

## 2014-06-24 DIAGNOSIS — IMO0001 Reserved for inherently not codable concepts without codable children: Secondary | ICD-10-CM | POA: Diagnosis not present

## 2014-06-24 NOTE — Progress Notes (Signed)
Physical Therapy Treatment Patient Details  Name: QUANESHIA WAREING MRN: [id number] Date of Birth: [birth year]  Today's Date: 06/24/2014 Time: 1153-1226 PT Time Calculation (min): 33 min    TherEx [id number] Visit#: 2 of 16  Re-eval: 07/17/14 Assessment Diagnosis: Lt knee Next MD Visit: Dr. Wylie Hail Prior Therapy: yes following fall, diffierent issue.   Authorization: [employer]   Authorization Time Period:    Authorization Visit#: 1 of 16   Subjective: Symptoms/Limitations Symptoms: No recent pain  Exercise/Treatments Stretches Active Hamstring Stretch: Limitations Active Hamstring Stretch Limitations: 8' 3way 10x 3seconds Quad Stretch Limitations: 8" retro half knee 3way hip drives 23F Hip Flexor Stretch: Limitations Hip Flexor Stretch Limitations: to 8" 3way 10x 3seconds Piriformis Stretch: Limitations Piriformis Stretch Limitations: seated 3 way 10x 3 seconds Gastroc Stretch: Limitations Gastroc Stretch Limitations: 3way at wall 10x 3seconds Standing  Squat matrix 2x each  Physical Therapy Assessment and Plan PT Assessment and Plan Clinical Impression Statement: Patient displays Lt anterior knee pain secondary to hip and knee stiffness and Lt LE weakness. This session focused on education and performance of hamstring, calf, glut and rectus femoris stretching following which patient noted decreased pain. Multiplanar squats were introduced with no complaint of pain though patient required cuing for correct form.  PT Plan: Next session to focus on initiallizing  glut med/max strengtheing to decrease loading on the knee     Goals PT Short Term Goals PT Short Term Goal 1: Patient will display glut med/min strength of 4/5 so patient ambulates with only minor knee valgus moment.  PT Short Term Goal 1 - Progress: Progressing toward goal PT Short Term Goal 2: patient will perform single leg squat depth of 120 degrees indicating good LE strength and patient ready to return to  running PT Short Term Goal 2 - Progress: Progressing toward goal PT Short Term Goal 3: patient will perform single leg squat endurance test of 20 in 60 seconds indicating good LE endurance and patient ready to return to running PT Short Term Goal 3 - Progress: Progressing toward goal PT Short Term Goal 4: Patient will dissplay negative 90/90 hamstring test and negative thomas test indicating improved stride length during runingn and walking PT Short Term Goal 4 - Progress: Progressing toward goal PT Long Term Goals PT Long Term Goal 1: Patient will display glut med/min strength of 5/5 so patient runs with only minor knee valgus moment.  PT Long Term Goal 1 - Progress: Progressing toward goal PT Long Term Goal 2: patient able to jog 1 mile without knee symptoms during or after run PT Long Term Goal 2 - Progress: Progressing toward goal Long Term Goal 3: patient will demosntrate 3x single leg hop for distance equal blataterly indicatign equal LE power an patient ready to return to sport Long Term Goal 3 Progress: Progressing toward goal  Problem List Patient Active Problem List   Diagnosis Date Noted  . Stiffness of joint, not elsewhere classified, pelvic region and thigh 06/17/2014  . Left anterior knee pain 06/03/2014  . Knee pain, left 04/02/2014  . Shoulder pain 11/18/2012  . Pain in joint, shoulder region 09/03/2012  . Muscle weakness (generalized) 09/03/2012  . 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 Pregnancy complicated by previous gastric bypass, antepartum 10/05/2011    Class: Present on Admission  . Obesity     PT - End of Session Activity Tolerance: Patient tolerated treatment well;Patient limited by fatigue;Other (comment) General Behavior During Therapy: WFL for tasks assessed/performed PT Plan of Care PT  Home Exercise Plan: hamstring, piriformis, and calf 3way  stretches  GP    DeWitt, Cash R 06/24/2014, 12:56 PM

## 2014-06-24 NOTE — Telephone Encounter (Signed)
Patient called and said that the Tramadol that was prescribed to her makes her feel jittery and makes her have headaches. Patient was wondering if there was any other medicine available for her to take instead of the current medicine. Patient phone is [phone]

## 2014-06-24 NOTE — Telephone Encounter (Signed)
Called patient, no answer, left message to return call 

## 2014-06-25 NOTE — Telephone Encounter (Signed)
No return call from patient.

## 2014-06-29 NOTE — Telephone Encounter (Signed)
Advised patient ibuprofen 800 tid per dr [name] verbal orders Patient states she has had gastric bypass and unable to take nsaids Please advise

## 2014-06-29 NOTE — Telephone Encounter (Signed)
No other recommendations  Treatment for her condition is therapy and tramadol / nsaids

## 2014-06-30 ENCOUNTER — Ambulatory Visit (HOSPITAL_COMMUNITY)
Admission: RE | Admit: 2014-06-30 | Discharge: 2014-06-30 | Disposition: A | Discharge status: Home / Self Care | Payer: Managed Care, Other (non HMO) | Source: Ambulatory Visit | Attending: Family Medicine | Admitting: Family Medicine

## 2014-06-30 DIAGNOSIS — M25669 Stiffness of unspecified knee, not elsewhere classified: Secondary | ICD-10-CM | POA: Insufficient documentation

## 2014-06-30 DIAGNOSIS — R269 Unspecified abnormalities of gait and mobility: Secondary | ICD-10-CM | POA: Insufficient documentation

## 2014-06-30 DIAGNOSIS — M6281 Muscle weakness (generalized): Secondary | ICD-10-CM | POA: Insufficient documentation

## 2014-06-30 DIAGNOSIS — M25569 Pain in unspecified knee: Secondary | ICD-10-CM | POA: Diagnosis not present

## 2014-06-30 DIAGNOSIS — IMO0001 Reserved for inherently not codable concepts without codable children: Secondary | ICD-10-CM | POA: Insufficient documentation

## 2014-06-30 NOTE — Progress Notes (Signed)
Physical Therapy Treatment Patient Details  Name: [name] MRN: [id number] Date of Birth: 1976/02/24  Today's Date: 06/30/2014 Time: 1478-2956 PT Time Calculation (min): 34 min Charge: TE [id number]  Visit#: 3 of 16  Re-eval: 07/17/14 Assessment Diagnosis: Lt knee Next MD Visit: [name], unscheduled Prior Therapy: yes following fall, diffierent issue.   Authorization: [employer]   Authorization Time Period:    Authorization Visit#: 3 of 16   Subjective: Symptoms/Limitations Symptoms: Pt reports compliance with HEP daily without question.  Pt stated most difficutly currently with stairs. Pain Assessment Currently in Pain?: Yes Pain Score: 4  (soreness) Pain Location: Knee Pain Orientation: Left  Objective:  Exercise/Treatments Stretches Active Hamstring Stretch: Limitations;3 reps;30 seconds Active Hamstring Stretch Limitations: 8' 3way  Quad Stretch Limitations: 8" retro half knee 3way hip drives 84O Hip Flexor Stretch: Limitations Hip Flexor Stretch Limitations: to 8" 3way 10x 3seconds Piriformis Stretch: Limitations Piriformis Stretch Limitations: seated 3 way 10x 3 seconds Gastroc Stretch: Limitations;3 reps;30 seconds Gastroc Stretch Limitations: slant board Standing Forward Lunges: 10 reps;Limitations Forward Lunges Limitations: 6in step Side Lunges: 10 reps;Limitations Side Lunges Limitations: 6in step Lateral Step Up: Left;10 reps;Hand Hold: 1;Step Height: 4" Forward Step Up: Left;10 reps;Hand Hold: 1;Step Height: 6" Step Down: Left;10 reps;Hand Hold: 1;Step Height: 4" Other Standing Knee Exercises: squat matrix 5 reps each    Physical Therapy Assessment and Plan PT Assessment and Plan Clinical Impression Statement: Progressed to stair training following report of most difficult functional task.  Pt able to demonstrate appropriate form following cueing with noted weak eccentric quad control descending step.  Began lunges for glueal strengthening to  decreased loading on knee with cueing for form and technique.  Continued with stretches with vast improvements in mobility following.  Pt reported pain scale same at end of session.  Pt educated on benefits of continuing to movement with arthritis and encouraged to apply ice with elevation for pain control.   PT Plan: Next session to focus on initiallizing  glut med/max strengtheing to decrease loading on the knee     Goals PT Short Term Goals PT Short Term Goal 1: Patient will display glut med/min strength of 4/5 so patient ambulates with only minor knee valgus moment.  PT Short Term Goal 1 - Progress: Progressing toward goal PT Short Term Goal 2: patient will perform single leg squat depth of 120 degrees indicating good LE strength and patient ready to return to running PT Short Term Goal 3: patient will perform single leg squat endurance test of 20 in 60 seconds indicating good LE endurance and patient ready to return to running PT Short Term Goal 4: Patient will dissplay negative 90/90 hamstring test and negative thomas test indicating improved stride length during runingn and walking  Problem List Patient Active Problem List   Diagnosis Date Noted  . Stiffness of joint, not elsewhere classified, pelvic region and thigh 06/17/2014  . Left anterior knee pain 06/03/2014  . Knee pain, left 04/02/2014  . Shoulder pain 11/18/2012  . Pain in joint, shoulder region 09/03/2012  . Muscle weakness (generalized) 09/03/2012  . 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 Pregnancy complicated by previous gastric bypass, antepartum 10/05/2011    Class: Present on Admission  . Obesity     PT - End of Session Activity Tolerance: Patient tolerated treatment well General Behavior During Therapy: [facility] for tasks assessed/performed  GP    Aldona Lento 06/30/2014, 2:23 PM

## 2014-07-01 ENCOUNTER — Ambulatory Visit (HOSPITAL_COMMUNITY): Admitting: Physical Therapy

## 2014-07-01 NOTE — Telephone Encounter (Signed)
Patient informed of Dr. Harrison's reply. 

## 2014-07-01 NOTE — Telephone Encounter (Signed)
tammy will contact patient

## 2014-07-06 ENCOUNTER — Inpatient Hospital Stay (HOSPITAL_COMMUNITY): Admission: RE | Admit: 2014-07-06 | Source: Ambulatory Visit | Admitting: Physical Therapy

## 2014-07-08 ENCOUNTER — Ambulatory Visit (HOSPITAL_COMMUNITY)
Admission: RE | Admit: 2014-07-08 | Discharge: 2014-07-08 | Disposition: A | Discharge status: Home / Self Care | Payer: Managed Care, Other (non HMO) | Source: Ambulatory Visit | Attending: Family Medicine | Admitting: Family Medicine

## 2014-07-08 DIAGNOSIS — IMO0001 Reserved for inherently not codable concepts without codable children: Secondary | ICD-10-CM | POA: Diagnosis not present

## 2014-07-08 NOTE — Progress Notes (Addendum)
Physical Therapy Treatment Patient Details  Name: SHELLBY SCHLINK MRN: [id number] Date of Birth: [birth year]  Today's Date: 07/08/2014 Time: 2706-2376 PT Time Calculation (min): 48 min Charge TE [id number], NMR [id number]   Visit#: 4 of 16  Re-eval: 07/17/14 Assessment Diagnosis: Lt knee Next MD Visit: [name], unscheduled Prior Therapy: yes following fall, diffierent issue.   Authorization: [employer]   Authorization Time Period:    Authorization Visit#: 4 of 16   Subjective: Symptoms/Limitations Symptoms: Pt stated she has been pain free all week, complaint with HEP daily.  Reports decending stairs is getting easier. Pain Assessment Currently in Pain?: No/denies  Objective:   Exercise/Treatments Stretches Active Hamstring Stretch: Limitations;3 reps;30 seconds Active Hamstring Stretch Limitations: 8' 3way  Hip Flexor Stretch: Limitations Hip Flexor Stretch Limitations: to 8" 3way 10x 3seconds Gastroc Stretch: Limitations;3 reps;30 seconds Gastroc Stretch Limitations: slant board Aerobic Elliptical: 5' L1 Standing Forward Lunges: 15 reps;Limitations Forward Lunges Limitations: 4 in step Side Lunges: 15 reps;Limitations Side Lunges Limitations: 4 in step Lateral Step Up: Left;Hand Hold: 1;Step Height: 4";2 sets;5 reps;Step Height: 6" Forward Step Up: Left;10 reps;Step Height: 6";Hand Hold: 0 Step Down: Left;10 reps;Hand Hold: 1;Step Height: 6" Functional Squat: 5 reps;Limitations Functional Squat Limitations: single leg squat to 55 degrees Other Standing Knee Exercises: squat matrix 5 reps each= Neurtal, NBOS, WBOS, split stance IR and ER Other Standing Knee Exercises: squat reach matrix with 3# 5 reps   Manual Therapy Manual Therapy: Other (comment) Joint Mobilization: patella mobs all directions and tib/fib Other Manual Therapy: Kinesio taping for medial patella tracking  Physical Therapy Assessment and Plan PT Assessment and Plan Clinical Impression  Statement: Increased ease demonstrated with stiar training this session, able to increase to 6in step height with minimum difficulty.  Noted visible fatigue with descending due to weak eccentric quad control.  Progressed glut med and max strength with squat reach matrix with min cueing for form.  Began SLS activities, pt able to SLS 60"+, progressed to vector stance and signle leg squats to improve LE stability.  Pt able to single leg squat to 55 degrees.  Pt educated on patella mobs to reduce risk of knee "popping" during gait.  Kinesio taping complete to improve medial tracking with gait.  Ended session with [facility] to begin jogging program, no reports of "popping".  No reports of pain through session.   PT Plan: Next session to focus on initiallizing  glut med/max strengtheing to decrease loading on the knee     Goals Home Exercise Program PT Goal: Perform Home Exercise Program - Progress: Progressing toward goal PT Short Term Goals PT Short Term Goal 1: Patient will display glut med/min strength of 4/5 so patient ambulates with only minor knee valgus moment.  PT Short Term Goal 1 - Progress: Progressing toward goal PT Short Term Goal 2: patient will perform single leg squat depth of 120 degrees indicating good LE strength and patient ready to return to running PT Short Term Goal 3: patient will perform single leg squat endurance test of 20 in 60 seconds indicating good LE endurance and patient ready to return to running PT Short Term Goal 3 - Progress: Progressing toward goal PT Short Term Goal 4: Patient will dissplay negative 90/90 hamstring test and negative thomas test indicating improved stride length during runingn and walking PT Short Term Goal 4 - Progress: Progressing toward goal PT Long Term Goals PT Long Term Goal 1: Patient will display glut med/min strength of 5/5 so patient runs with only  minor knee valgus moment.  PT Long Term Goal 1 - Progress: Progressing toward  goal  Problem List Patient Active Problem List   Diagnosis Date Noted  . Stiffness of joint, not elsewhere classified, pelvic region and thigh 06/17/2014  . Left anterior knee pain 06/03/2014  . Knee pain, left 04/02/2014  . Shoulder pain 11/18/2012  . Pain in joint, shoulder region 09/03/2012  . Muscle weakness (generalized) 09/03/2012  . 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 Pregnancy complicated by previous gastric bypass, antepartum 10/05/2011    Class: Present on Admission  . Obesity     PT - End of Session Activity Tolerance: Patient tolerated treatment well General Behavior During Therapy: [facility] for tasks assessed/performed  GP    Aldona Lento 07/08/2014, 4:36 PM

## 2014-07-26 ENCOUNTER — Other Ambulatory Visit: Payer: Self-pay | Admitting: Family Medicine

## 2014-07-26 NOTE — Telephone Encounter (Signed)
Ok to refill??  Last office visit 04/20/2014.  Last refill 05/04/2014.

## 2014-07-26 NOTE — Telephone Encounter (Signed)
Ok, also due to recheck cbc on iron.

## 2014-07-26 NOTE — Telephone Encounter (Signed)
Medication called to pharmacy.  Call placed to patient and patient made aware.  

## 2014-09-01 ENCOUNTER — Emergency Department (HOSPITAL_COMMUNITY)
Admission: EM | Admit: 2014-09-01 | Discharge: 2014-09-01 | Disposition: A | Discharge status: Home / Self Care | Payer: Managed Care, Other (non HMO) | Attending: Emergency Medicine | Admitting: Emergency Medicine

## 2014-09-01 ENCOUNTER — Encounter (HOSPITAL_COMMUNITY): Payer: Self-pay | Admitting: Emergency Medicine

## 2014-09-01 ENCOUNTER — Emergency Department (HOSPITAL_COMMUNITY): Payer: Managed Care, Other (non HMO)

## 2014-09-01 DIAGNOSIS — Z8739 Personal history of other diseases of the musculoskeletal system and connective tissue: Secondary | ICD-10-CM | POA: Insufficient documentation

## 2014-09-01 DIAGNOSIS — G8929 Other chronic pain: Secondary | ICD-10-CM | POA: Insufficient documentation

## 2014-09-01 DIAGNOSIS — Z880 Allergy status to penicillin: Secondary | ICD-10-CM | POA: Insufficient documentation

## 2014-09-01 DIAGNOSIS — R0789 Other chest pain: Secondary | ICD-10-CM | POA: Diagnosis not present

## 2014-09-01 DIAGNOSIS — Z8659 Personal history of other mental and behavioral disorders: Secondary | ICD-10-CM | POA: Insufficient documentation

## 2014-09-01 DIAGNOSIS — R002 Palpitations: Secondary | ICD-10-CM | POA: Insufficient documentation

## 2014-09-01 LAB — CBC
HCT: 32.2 % — ABNORMAL LOW (ref 36.0–46.0)
Hemoglobin: 9.9 g/dL — ABNORMAL LOW (ref 12.0–15.0)
MCH: 22.4 pg — ABNORMAL LOW (ref 26.0–34.0)
MCHC: 30.7 g/dL (ref 30.0–36.0)
MCV: 72.9 fL — ABNORMAL LOW (ref 78.0–100.0)
PLATELETS: 315 10*3/uL (ref 150–400)
RBC: 4.42 MIL/uL (ref 3.87–5.11)
RDW: 15 % (ref 11.5–15.5)
WBC: 7 10*3/uL (ref 4.0–10.5)

## 2014-09-01 LAB — BASIC METABOLIC PANEL
ANION GAP: 12 (ref 5–15)
BUN: 10 mg/dL (ref 6–23)
CHLORIDE: 104 meq/L (ref 96–112)
CO2: 25 mEq/L (ref 19–32)
Calcium: 8.9 mg/dL (ref 8.4–10.5)
Creatinine, Ser: 0.73 mg/dL (ref 0.50–1.10)
GFR calc non Af Amer: >90 mL/min (ref >90)
Glucose, Bld: 133 mg/dL — ABNORMAL HIGH (ref 70–99)
POTASSIUM: 4.2 meq/L (ref 3.7–5.3)
SODIUM: 141 meq/L (ref 137–147)

## 2014-09-01 LAB — D-DIMER, QUANTITATIVE (NOT AT ARMC): D DIMER QUANT: 0.43 ug{FEU}/mL (ref 0.00–0.48)

## 2014-09-01 LAB — TROPONIN I

## 2014-09-01 NOTE — ED Notes (Signed)
Family at bedside. Patient eating candy bar and drinking [place] at this time.

## 2014-09-01 NOTE — Discharge Instructions (Signed)
Chest Pain (Nonspecific) There is no evidence of a heart attack or blood clot in the lung. Followup with your doctor. Return to the ED if you develop new or worsening symptoms. It is often hard to give a specific diagnosis for the cause of chest pain. There is always a chance that your pain could be related to something serious, such as a heart attack or a blood clot in the lungs. You need to follow up with your health care provider for further evaluation. CAUSES   Heartburn.  Pneumonia or bronchitis.  Anxiety or stress.  Inflammation around your heart (pericarditis) or lung (pleuritis or pleurisy).  A blood clot in the lung.  A collapsed lung (pneumothorax). It can develop suddenly on its own (spontaneous pneumothorax) or from trauma to the chest.  Shingles infection (herpes zoster virus). The chest wall is composed of bones, muscles, and cartilage. Any of these can be the source of the pain.  The bones can be bruised by injury.  The muscles or cartilage can be strained by coughing or overwork.  The cartilage can be affected by inflammation and become sore (costochondritis). DIAGNOSIS  Lab tests or other studies may be needed to find the cause of your pain. Your health care provider may have you take a test called an ambulatory electrocardiogram (ECG). An ECG records your heartbeat patterns over a 24-hour period. You may also have other tests, such as:  Transthoracic echocardiogram (TTE). During echocardiography, sound waves are used to evaluate how blood flows through your heart.  Transesophageal echocardiogram (TEE).  Cardiac monitoring. This allows your health care provider to monitor your heart rate and rhythm in real time.  Holter monitor. This is a portable device that records your heartbeat and can help diagnose heart arrhythmias. It allows your health care provider to track your heart activity for several days, if needed.  Stress tests by exercise or by giving medicine  that makes the heart beat faster. TREATMENT   Treatment depends on what may be causing your chest pain. Treatment may include:  Acid blockers for heartburn.  Anti-inflammatory medicine.  Pain medicine for inflammatory conditions.  Antibiotics if an infection is present.  You may be advised to change lifestyle habits. This includes stopping smoking and avoiding alcohol, caffeine, and chocolate.  You may be advised to keep your head raised (elevated) when sleeping. This reduces the chance of acid going backward from your stomach into your esophagus. Most of the time, nonspecific chest pain will improve within 2-3 days with rest and mild pain medicine.  HOME CARE INSTRUCTIONS   If antibiotics were prescribed, take them as directed. Finish them even if you start to feel better.  For the next few days, avoid physical activities that bring on chest pain. Continue physical activities as directed.  Do not use any tobacco products, including cigarettes, chewing tobacco, or electronic cigarettes.  Avoid drinking alcohol.  Only take medicine as directed by your health care provider.  Follow your health care provider's suggestions for further testing if your chest pain does not go away.  Keep any follow-up appointments you made. If you do not go to an appointment, you could develop lasting (chronic) problems with pain. If there is any problem keeping an appointment, call to reschedule. SEEK MEDICAL CARE IF:   Your chest pain does not go away, even after treatment.  You have a rash with blisters on your chest.  You have a fever. SEEK IMMEDIATE MEDICAL CARE IF:   You have increased  chest pain or pain that spreads to your arm, neck, jaw, back, or abdomen. °· You have shortness of breath. °· You have an increasing cough, or you cough up blood. °· You have severe back or abdominal pain. °· You feel nauseous or vomit. °· You have severe weakness. °· You faint. °· You have chills. °This is an  emergency. Do not wait to see if the pain will go away. Get medical help at once. Call your local emergency services (911 in [place] Do not drive yourself to the hospital. °MAKE SURE YOU:  °· Understand these instructions. °· Will watch your condition. °· Will get help right away if you are not doing well or get worse. °Document Released: 09/26/2005 Document Revised: 12/22/2013 Document Reviewed: 07/22/2008 °[employer]® Patient Information ©2015 [employer]. This information is not intended to replace advice given to you by your health care provider. Make sure you discuss any questions you have with your health care provider. ° °

## 2014-09-01 NOTE — ED Notes (Signed)
intermittent heart palpitations starting today with dizziness and periods of cp.  Denies at this time.

## 2014-09-01 NOTE — ED Notes (Signed)
Patient states that she is not having any symptoms at this time.

## 2014-09-01 NOTE — ED Provider Notes (Signed)
CSN: [id number]     Arrival date & time 09/01/14  1712 History  This chart was scribed for [name], MD by Delphia Grates, ED Scribe. This patient was seen in room APA18/APA18 and the patient's care was started at 7:02 PM.     Chief Complaint  Patient presents with  . Palpitations     The history is provided by the patient. No language interpreter was used.    HPI Comments: [name] is a 38 y.o. female who presents to the Emergency Department complaining of constant palpitations that began this morning. There is associated intermittent, nonradiating left sided chest pain lasting from 30-40 minutes before resolving on its own for a few hours. Patient has history of anxiety attacks where she experienced palpitations, but states this CP is a new occurrence. She reports she experienced this chest pain a total of 3 times today. She denies any alleviating or aggravating symptoms. Patient also denies fever, diaphoresis, cough, SOB, abdominal pain, nausea, neck pain, or back pain. Patient states she felt light-headed PTA, but denies any dizziness or light-headedness at present. Patient has history of anemia due to an iron deficiency. She states she had a gastric bypass and is unable to absorb vitamins efficiently. Patient has a copper IUD in place, and has family history of MI (Grandfather).  PCP: Dr. Claudius Sis.  Past Medical History  Diagnosis Date  . Obesity     gastric Bypass, Roux-en Y  . Migraines   . Frozen shoulder syndrome   . Depression   . Chronic knee pain   . Chronic left shoulder pain   . Chronic neck pain   . Patellofemoral syndrome, left   . Anxiety    Past Surgical History  Procedure Laterality Date  . Gastric bypass  04/20/2011    [place], [place] 271lb preop  . Fertility surgery  2009    No abnormalities in female   Family History  Problem Relation Age of Onset  . Diabetes Father   . Hyperlipidemia Father   . Hypertension Father   . Cancer  Father     skin cancer  . Depression Mother     lifelong  . Anesthesia problems Neg Hx   . Arthritis     History  Substance Use Topics  . Smoking status: Never Smoker   . Smokeless tobacco: Never Used  . Alcohol Use: No   OB History   Grav Para Term Preterm Abortions TAB SAB Ect Mult Living   3 3 3  0 0 0 0 0 0 3     Review of Systems A complete 10 system review of systems was obtained and all systems are negative except as noted in the HPI and PMH. .    Allergies  Morphine and related; Nsaids; and Penicillins  Home Medications   Prior to Admission medications   Not on File   Triage Vitals: BP 130/72  Pulse 95  Temp(Src) 98.1 F (36.7 C) (Oral)  Resp 16  Ht 5\' 5"  (1.651 m)  Wt 165 lb (74.844 kg)  BMI 27.46 kg/m2  SpO2 100%  LMP 08/31/2014  Physical Exam  Nursing note and vitals reviewed. Constitutional: She is oriented to person, place, and time. She appears well-developed and well-nourished. No distress.  HENT:  Head: Normocephalic and atraumatic.  Mouth/Throat: Oropharynx is clear and moist. No oropharyngeal exudate.  Eyes: Conjunctivae and EOM are normal. Pupils are equal, round, and reactive to light.  Neck: Normal range of motion. Neck supple.  No meningismus.  Cardiovascular: Normal rate, regular rhythm, normal heart sounds and intact distal pulses.   No murmur heard. Pulmonary/Chest: Effort normal and breath sounds normal. No respiratory distress. She exhibits no tenderness.  Abdominal: Soft. There is no tenderness. There is no rebound and no guarding.  Musculoskeletal: Normal range of motion. She exhibits no edema and no tenderness.  Neurological: She is alert and oriented to person, place, and time. No cranial nerve deficit. She exhibits normal muscle tone. Coordination normal.  No ataxia on finger to nose bilaterally. No pronator drift. 5/5 strength throughout. CN 2-12 intact. Negative Romberg. Equal grip strength. Sensation intact. Gait is normal.    Skin: Skin is warm. No rash noted.  Psychiatric: She has a normal mood and affect. Her behavior is normal.    ED Course  Procedures (including critical care time)  DIAGNOSTIC STUDIES: Oxygen Saturation is 100% on room air, normal by my interpretation.    COORDINATION OF CARE: At 1907 Discussed treatment plan with patient which includes observation. Patient agrees.   Labs Review Labs Reviewed  CBC - Abnormal; Notable for the following:    Hemoglobin 9.9 (*)    HCT 32.2 (*)    MCV 72.9 (*)    MCH 22.4 (*)    All other components within normal limits  BASIC METABOLIC PANEL - Abnormal; Notable for the following:    Glucose, Bld 133 (*)    All other components within normal limits  TROPONIN I  TROPONIN I  D-DIMER, QUANTITATIVE    Imaging Review Dg Chest 2 View  09/01/2014   CLINICAL DATA:  Left-sided chest pain and palpitations  EXAM: CHEST  2 VIEW  COMPARISON:  10/06/2006  FINDINGS: The heart size and mediastinal contours are within normal limits. Both lungs are clear. The visualized skeletal structures are unremarkable.  IMPRESSION: No active cardiopulmonary disease.   Electronically Signed   By: Inez Catalina M.D.   On: 09/01/2014 18:56     EKG Interpretation None      MDM   Final diagnoses:  Atypical chest pain  Palpitations   constant palpitations that onset this morning associated with periods of intermittent chest pain. Chest pain is left-sided and lasts for about 40 minutes at a time. It does not radiate. No shortness of breath, nausea or diaphoresis. No cough or fever.  EKG is normal sinus rhythm. Chest x-ray is negative. Chest pain is not reproducible. Chronic anemia is stable.  Chest x-ray is negative. Troponin is negative. D-dimer is negative. Patient with no chest pain 80. Her pain is not reproducible.  Symptoms are atypical for ACS. HEART score is 1. Low suspicion for ACS or PE.  Troponin negative x2. EKG unchanged. Patient no chest pain in the ED. Stable  for outpatient followup with her PCP. Return precautions discussed including worsening chest pain or shortness of breath. Discussed that she may need an outpatient stress test. Patient and family expressed understanding.   Date: 09/01/2014  Rate: 87  Rhythm: normal sinus rhythm  QRS Axis: normal  Intervals: normal  ST/T Wave abnormalities: normal  Conduction Disutrbances:none  Narrative Interpretation:   Old EKG Reviewed: unchanged   I personally performed the services described in this documentation, which was scribed in my presence. The recorded information has been reviewed and is accurate.   [name], MD 09/01/14 [phone]

## 2014-09-21 NOTE — Progress Notes (Signed)
  Patient Details  Name: [name] MRN: [id number] Date of Birth: 12/31/1976  Today's Date: 09/21/2014  Patient discharged due to not returnign to therapy following last appointment on 07/08/14   [name] R 09/21/2014, 9:13 AM

## 2014-09-21 NOTE — Addendum Note (Signed)
Encounter addended by: Leia Alf, PT on: 09/21/2014  9:15 AM<BR>     Documentation filed: Letters, Notes Section, Episodes

## 2014-11-01 ENCOUNTER — Encounter (HOSPITAL_COMMUNITY): Payer: Self-pay | Admitting: Emergency Medicine

## 2015-01-23 IMAGING — CR DG KNEE COMPLETE 4+V*L*
4 series · 4 of 4 positions shown · non-contrast
Comparison: None.

CLINICAL DATA: Left knee weakness

EXAM:
LEFT KNEE - COMPLETE 4+ VIEW

[view not recorded (1 of 4)]
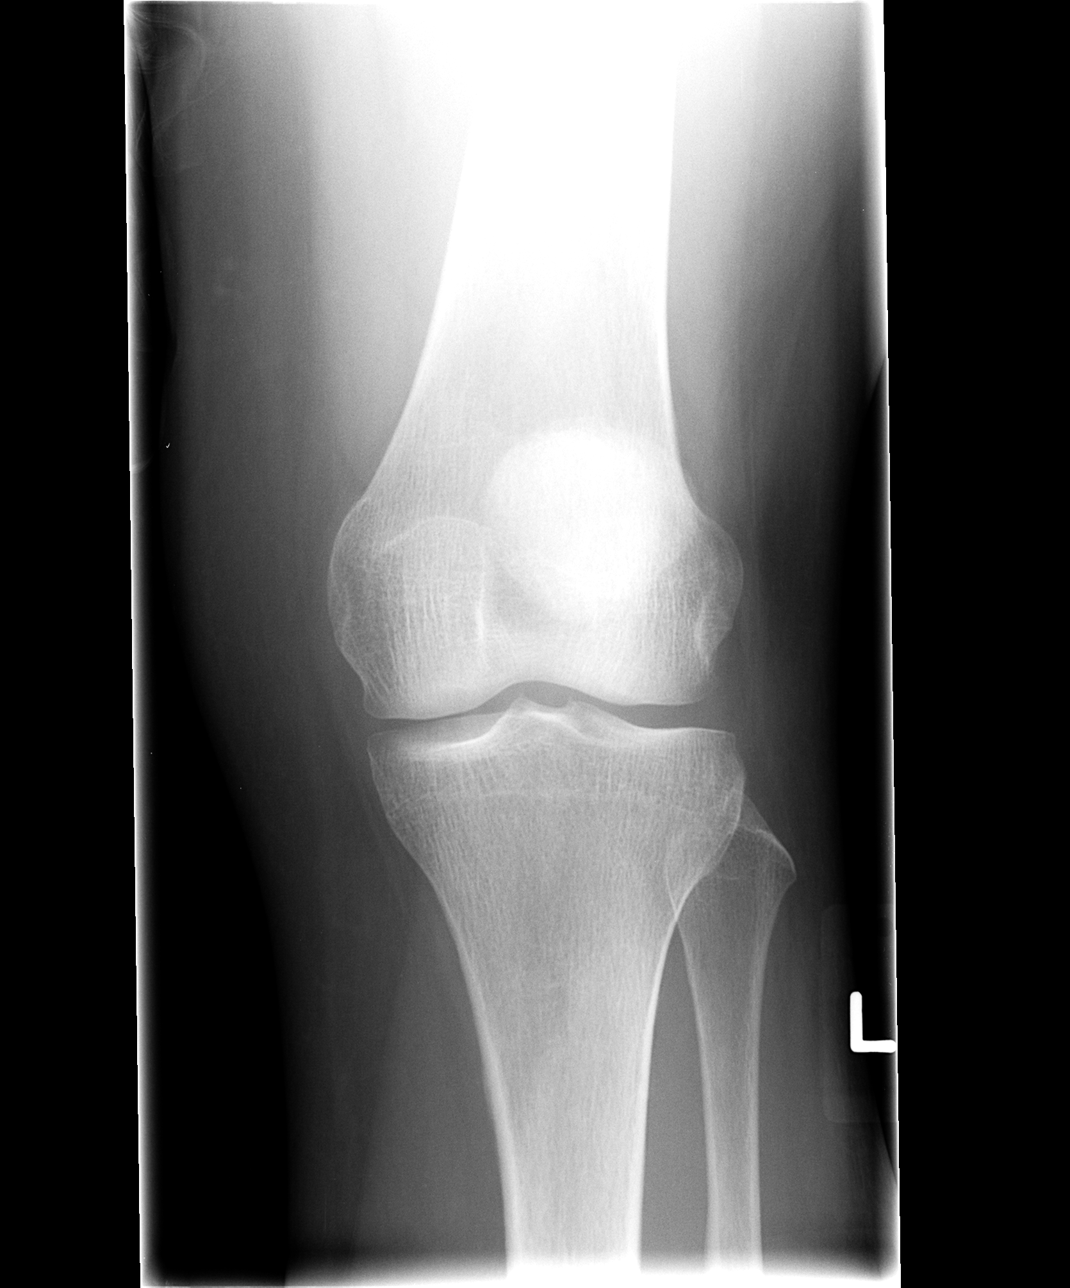

[view not recorded (2 of 4)]
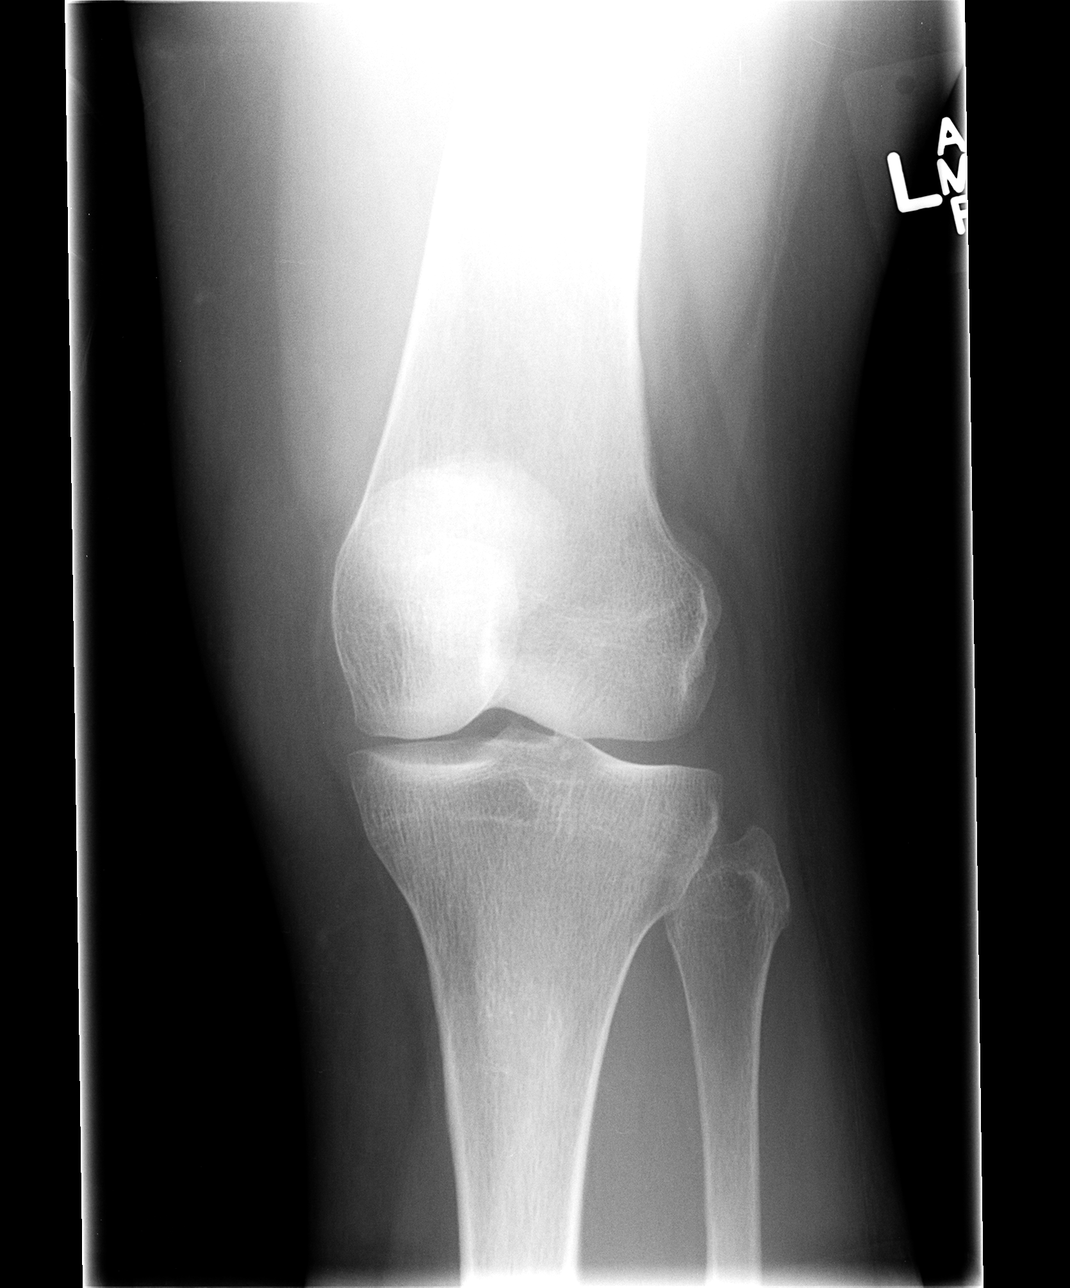

[view not recorded (3 of 4)]
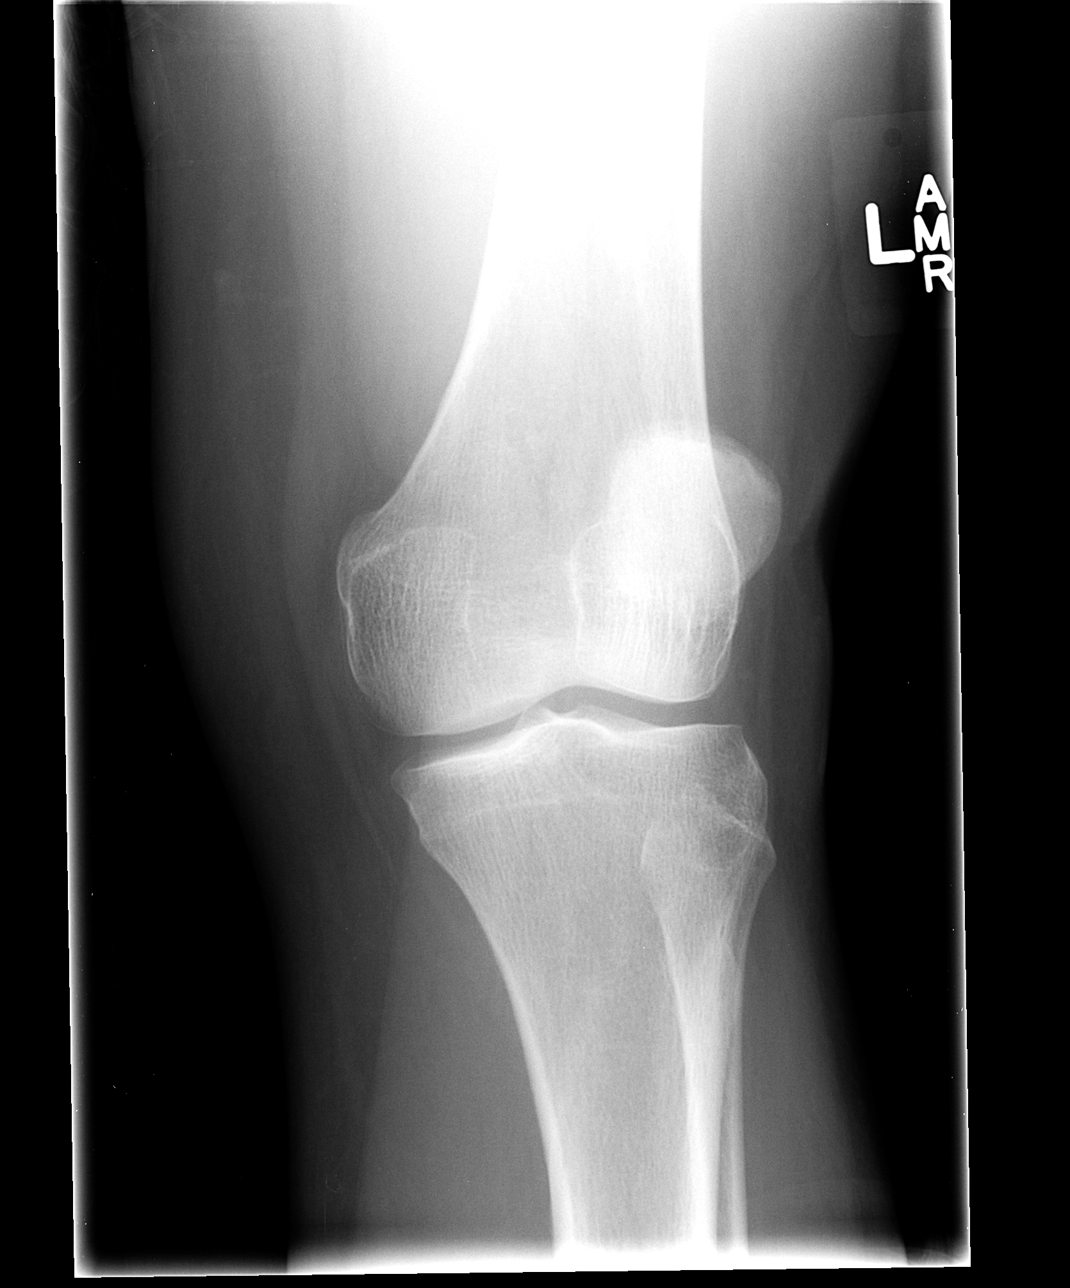

[view not recorded (4 of 4)]
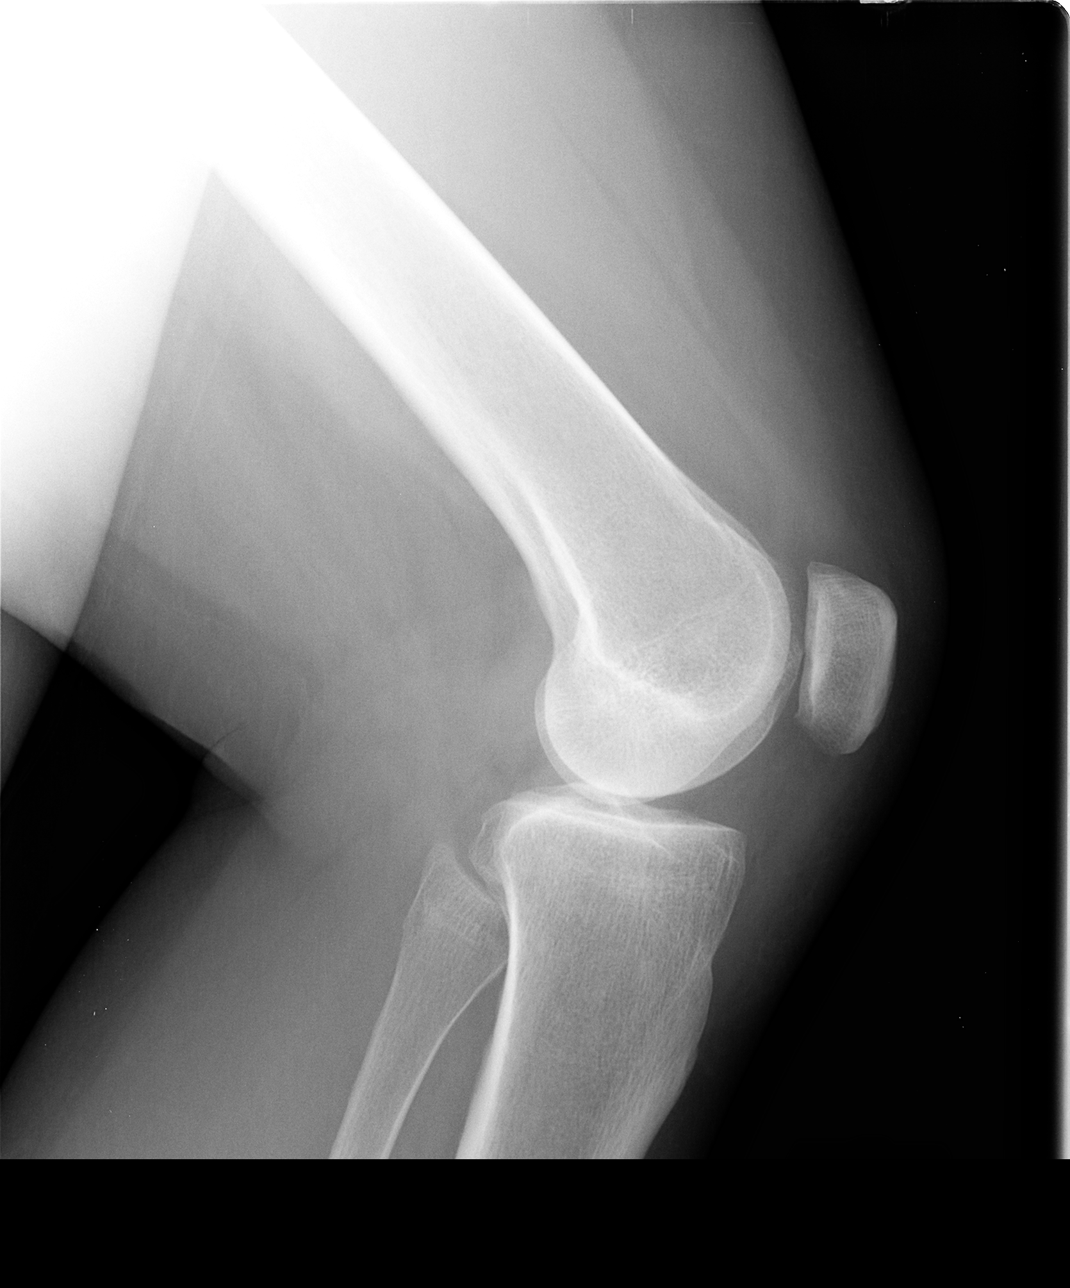

[4 of 4 positions shown; findings below may reference images not displayed]

FINDINGS: There is no evidence of fracture, dislocation, or joint effusion.
There is no evidence of arthropathy or other focal bone abnormality.
Soft tissues are unremarkable.
IMPRESSION: Negative.

## 2015-01-25 ENCOUNTER — Encounter: Payer: Self-pay | Admitting: Family Medicine

## 2015-03-07 ENCOUNTER — Other Ambulatory Visit: Payer: Self-pay | Admitting: Family Medicine

## 2015-03-07 NOTE — Telephone Encounter (Signed)
Ok to refill??  Last office visit 03/23/2014.  Last refill 05/04/2014.

## 2015-03-08 ENCOUNTER — Encounter: Payer: Self-pay | Admitting: Family Medicine

## 2015-03-08 NOTE — Telephone Encounter (Signed)
#  20 called in.  Letter to pt must be seen

## 2015-03-08 NOTE — Telephone Encounter (Signed)
Ok x1 ntbs

## 2015-03-09 ENCOUNTER — Encounter: Payer: Self-pay | Admitting: Family Medicine

## 2015-03-31 ENCOUNTER — Encounter (HOSPITAL_COMMUNITY): Payer: Self-pay | Admitting: Emergency Medicine

## 2015-03-31 ENCOUNTER — Emergency Department (HOSPITAL_COMMUNITY): Payer: Managed Care, Other (non HMO)

## 2015-03-31 ENCOUNTER — Emergency Department (HOSPITAL_COMMUNITY)
Admission: EM | Admit: 2015-03-31 | Discharge: 2015-03-31 | Disposition: A | Discharge status: Home / Self Care | Payer: Self-pay | Attending: Emergency Medicine | Admitting: Emergency Medicine

## 2015-03-31 DIAGNOSIS — Z8739 Personal history of other diseases of the musculoskeletal system and connective tissue: Secondary | ICD-10-CM | POA: Insufficient documentation

## 2015-03-31 DIAGNOSIS — F419 Anxiety disorder, unspecified: Secondary | ICD-10-CM | POA: Insufficient documentation

## 2015-03-31 DIAGNOSIS — Z3202 Encounter for pregnancy test, result negative: Secondary | ICD-10-CM | POA: Insufficient documentation

## 2015-03-31 DIAGNOSIS — E669 Obesity, unspecified: Secondary | ICD-10-CM | POA: Insufficient documentation

## 2015-03-31 DIAGNOSIS — F329 Major depressive disorder, single episode, unspecified: Secondary | ICD-10-CM | POA: Insufficient documentation

## 2015-03-31 DIAGNOSIS — R109 Unspecified abdominal pain: Secondary | ICD-10-CM | POA: Insufficient documentation

## 2015-03-31 DIAGNOSIS — G8929 Other chronic pain: Secondary | ICD-10-CM | POA: Insufficient documentation

## 2015-03-31 DIAGNOSIS — Z8679 Personal history of other diseases of the circulatory system: Secondary | ICD-10-CM | POA: Insufficient documentation

## 2015-03-31 DIAGNOSIS — Z880 Allergy status to penicillin: Secondary | ICD-10-CM | POA: Insufficient documentation

## 2015-03-31 DIAGNOSIS — R52 Pain, unspecified: Secondary | ICD-10-CM

## 2015-03-31 LAB — CBC WITH DIFFERENTIAL/PLATELET
Basophils Absolute: 0.1 10*3/uL (ref 0.0–0.1)
Basophils Relative: 1 % (ref 0–1)
EOS ABS: 0.4 10*3/uL (ref 0.0–0.7)
Eosinophils Relative: 6 % — ABNORMAL HIGH (ref 0–5)
HCT: 27.7 % — ABNORMAL LOW (ref 36.0–46.0)
Hemoglobin: 8.2 g/dL — ABNORMAL LOW (ref 12.0–15.0)
Lymphocytes Relative: 37 % (ref 12–46)
Lymphs Abs: 2.4 10*3/uL (ref 0.7–4.0)
MCH: 20.9 pg — ABNORMAL LOW (ref 26.0–34.0)
MCHC: 29.6 g/dL — ABNORMAL LOW (ref 30.0–36.0)
MCV: 70.5 fL — AB (ref 78.0–100.0)
Monocytes Absolute: 0.4 10*3/uL (ref 0.1–1.0)
Monocytes Relative: 6 % (ref 3–12)
Neutro Abs: 3.2 10*3/uL (ref 1.7–7.7)
Neutrophils Relative %: 50 % (ref 43–77)
PLATELETS: 300 10*3/uL (ref 150–400)
RBC: 3.93 MIL/uL (ref 3.87–5.11)
RDW: 15.9 % — AB (ref 11.5–15.5)
WBC: 6.3 10*3/uL (ref 4.0–10.5)

## 2015-03-31 LAB — URINALYSIS, ROUTINE W REFLEX MICROSCOPIC
Bilirubin Urine: NEGATIVE
Glucose, UA: NEGATIVE mg/dL
Hgb urine dipstick: NEGATIVE
Ketones, ur: NEGATIVE mg/dL
LEUKOCYTES UA: NEGATIVE
Nitrite: NEGATIVE
Protein, ur: NEGATIVE mg/dL
SPECIFIC GRAVITY, URINE: 1.02 (ref 1.005–1.030)
Urobilinogen, UA: 0.2 mg/dL (ref 0.0–1.0)
pH: 5.5 (ref 5.0–8.0)

## 2015-03-31 LAB — COMPREHENSIVE METABOLIC PANEL
ALBUMIN: 3.6 g/dL (ref 3.5–5.2)
ALK PHOS: 70 U/L (ref 39–117)
ALT: 12 U/L (ref 0–35)
AST: 18 U/L (ref 0–37)
Anion gap: 5 (ref 5–15)
BILIRUBIN TOTAL: 0.3 mg/dL (ref 0.3–1.2)
BUN: 11 mg/dL (ref 6–23)
CHLORIDE: 105 mmol/L (ref 96–112)
CO2: 26 mmol/L (ref 19–32)
Calcium: 8.4 mg/dL (ref 8.4–10.5)
Creatinine, Ser: 0.74 mg/dL (ref 0.50–1.10)
GFR calc Af Amer: >90 mL/min (ref >90)
Glucose, Bld: 155 mg/dL — ABNORMAL HIGH (ref 70–99)
Potassium: 3.7 mmol/L (ref 3.5–5.1)
SODIUM: 136 mmol/L (ref 135–145)
TOTAL PROTEIN: 6.5 g/dL (ref 6.0–8.3)

## 2015-03-31 LAB — PREGNANCY, URINE: Preg Test, Ur: NEGATIVE

## 2015-03-31 MED ORDER — HYDROMORPHONE HCL 1 MG/ML IJ SOLN
0.5000 mg | Freq: Once | INTRAMUSCULAR | Status: AC
  Administered 2015-03-31: 0.5 mg via INTRAVENOUS
  Filled 2015-03-31: qty 1

## 2015-03-31 MED ORDER — HYDROCODONE-ACETAMINOPHEN 5-325 MG PO TABS: 1.0000 | ORAL_TABLET | Freq: Four times a day (QID) | ORAL | Status: DC | PRN

## 2015-03-31 MED ORDER — TRAMADOL HCL 50 MG PO TABS: 50.0000 mg | ORAL_TABLET | Freq: Four times a day (QID) | ORAL | Status: DC | PRN

## 2015-03-31 MED ORDER — HYDROMORPHONE HCL 1 MG/ML IJ SOLN
1.0000 mg | Freq: Once | INTRAMUSCULAR | Status: AC
  Administered 2015-03-31: 1 mg via INTRAVENOUS
  Filled 2015-03-31: qty 1

## 2015-03-31 MED ORDER — ONDANSETRON HCL 4 MG/2ML IJ SOLN
4.0000 mg | Freq: Once | INTRAMUSCULAR | Status: AC
Start: 2015-03-31 — End: 2015-03-31
  Administered 2015-03-31: 4 mg via INTRAVENOUS
  Filled 2015-03-31: qty 2

## 2015-03-31 NOTE — ED Provider Notes (Signed)
CSN: [id number]     Arrival date & time 03/31/15  1746 History  This chart was scribed for Milton Ferguson, MD by Delphia Grates, ED Scribe. This patient was seen in room APA04/APA04 and the patient's care was started at 6:00 PM.   Chief Complaint  Patient presents with  . Flank Pain    Patient is a 39 y.o. female presenting with flank pain. The history is provided by the patient. No language interpreter was used.  Flank Pain This is a new problem. The current episode started 6 to 12 hours ago. The problem occurs rarely. The problem has not changed since onset.Pertinent negatives include no chest pain, no abdominal pain and no headaches. Exacerbated by: lying supine. Nothing relieves the symptoms. Treatments tried: hot bath and ibuprofen.     HPI Comments: [name] is a 39 y.o. female who presents to the Emergency Department complaining of sudden onset 7/10 right flank pain that began 6 hours ago. Patient has tried hot baths and ibuprofen and reports lying supine exacerbates the pain. She reports history of 2 kidney stones since having gastric bypass. She also reports fertility surgery. She denies abdominal pain.   Past Medical History  Diagnosis Date  . Obesity     gastric Bypass, Roux-en Y  . Migraines   . Frozen shoulder syndrome   . Depression   . Chronic knee pain   . Chronic left shoulder pain   . Chronic neck pain   . Patellofemoral syndrome, left   . Anxiety    Past Surgical History  Procedure Laterality Date  . Gastric bypass  04/20/2011    [place], wt 271lb preop  . Fertility surgery  2009    No abnormalities in female   Family History  Problem Relation Age of Onset  . Diabetes Father   . Hyperlipidemia Father   . Hypertension Father   . Cancer Father     skin cancer  . Depression Mother     lifelong  . Anesthesia problems Neg Hx   . Arthritis     History  Substance Use Topics  . Smoking status: Never Smoker   . Smokeless tobacco: Never  Used  . Alcohol Use: No   OB History    Gravida Para Term Preterm AB TAB SAB Ectopic Multiple Living   3 3 3  0 0 0 0 0 0 3     Review of Systems  Constitutional: Negative for appetite change and fatigue.  HENT: Negative for congestion, ear discharge and sinus pressure.   Eyes: Negative for discharge.  Respiratory: Negative for cough.   Cardiovascular: Negative for chest pain.  Gastrointestinal: Negative for abdominal pain and diarrhea.  Genitourinary: Positive for flank pain. Negative for frequency and hematuria.  Musculoskeletal: Negative for back pain.  Skin: Negative for rash.  Neurological: Negative for seizures and headaches.  Psychiatric/Behavioral: Negative for hallucinations.      Allergies  Morphine and related; Nsaids; and Penicillins  Home Medications   Prior to Admission medications   Medication Sig Start Date End Date Taking? Authorizing Provider  ALPRAZolam Duanne Moron) 0.5 MG tablet TAKE ONE TABLET BY MOUTH THREE TIMES DAILY AS NEEDED FOR SLEEP/ANXIETY 03/08/15   Susy Frizzle, MD   Triage Vitals: BP 113/46 mmHg  Pulse 79  Temp(Src) 98.1 F (36.7 C) (Oral)  Resp 16  Ht 5\' 5"  (1.651 m)  Wt 180 lb (81.647 kg)  BMI 29.95 kg/m2  SpO2 100%  LMP 03/21/2015  Physical Exam  Constitutional:  She is oriented to person, place, and time. She appears well-developed.  HENT:  Head: Normocephalic.  Eyes: Conjunctivae and EOM are normal. No scleral icterus.  Neck: Neck supple. No thyromegaly present.  Cardiovascular: Normal rate, regular rhythm and normal heart sounds.  Exam reveals no gallop and no friction rub.   No murmur heard. Pulmonary/Chest: Effort normal and breath sounds normal. No stridor. She has no wheezes. She has no rales. She exhibits no tenderness.  Abdominal: Soft. She exhibits no distension. There is no tenderness. There is CVA tenderness. There is no rebound.  Moderate right flank tenderness.  Musculoskeletal: Normal range of motion. She exhibits no  edema.  Lymphadenopathy:    She has no cervical adenopathy.  Neurological: She is oriented to person, place, and time. She exhibits normal muscle tone. Coordination normal.  Skin: No rash noted. No erythema.  Psychiatric: She has a normal mood and affect. Her behavior is normal.  Nursing note and vitals reviewed.   ED Course  Procedures (including critical care time)  DIAGNOSTIC STUDIES: Oxygen Saturation is 100% on room air, normal by my interpretation.    COORDINATION OF CARE: At 1802 Discussed treatment plan with patient. Patient agrees.   Labs Review Labs Reviewed - No data to display  Imaging Review No results found.   EKG Interpretation None      MDM   Final diagnoses:  None    Back pain,  Nl studies,   tx with ultram    The chart was scribed for me under my direct supervision.  I personally performed the history, physical, and medical decision making and all procedures in the evaluation of this patient.Milton Ferguson, MD 03/31/15 [phone]

## 2015-03-31 NOTE — ED Notes (Signed)
Pt reporting improvement in pain level.  No distress noted at present time.

## 2015-03-31 NOTE — ED Notes (Signed)
Pt c/o sudden onset of R flank pain. Prior hx of kidney stones.

## 2015-03-31 NOTE — Discharge Instructions (Signed)
Follow up with your md next week for recheck °

## 2015-04-12 ENCOUNTER — Encounter: Payer: Self-pay | Admitting: Family Medicine

## 2015-04-28 ENCOUNTER — Encounter (HOSPITAL_COMMUNITY): Payer: Self-pay

## 2015-04-28 ENCOUNTER — Emergency Department (HOSPITAL_COMMUNITY)
Admission: EM | Admit: 2015-04-28 | Discharge: 2015-04-29 | Disposition: A | Discharge status: Home / Self Care | Payer: Managed Care, Other (non HMO) | Attending: Emergency Medicine | Admitting: Emergency Medicine

## 2015-04-28 DIAGNOSIS — G8929 Other chronic pain: Secondary | ICD-10-CM | POA: Insufficient documentation

## 2015-04-28 DIAGNOSIS — E669 Obesity, unspecified: Secondary | ICD-10-CM | POA: Insufficient documentation

## 2015-04-28 DIAGNOSIS — Z880 Allergy status to penicillin: Secondary | ICD-10-CM | POA: Insufficient documentation

## 2015-04-28 DIAGNOSIS — Y9389 Activity, other specified: Secondary | ICD-10-CM | POA: Insufficient documentation

## 2015-04-28 DIAGNOSIS — Y998 Other external cause status: Secondary | ICD-10-CM | POA: Insufficient documentation

## 2015-04-28 DIAGNOSIS — F329 Major depressive disorder, single episode, unspecified: Secondary | ICD-10-CM | POA: Insufficient documentation

## 2015-04-28 DIAGNOSIS — F419 Anxiety disorder, unspecified: Secondary | ICD-10-CM | POA: Insufficient documentation

## 2015-04-28 DIAGNOSIS — X58XXXA Exposure to other specified factors, initial encounter: Secondary | ICD-10-CM | POA: Insufficient documentation

## 2015-04-28 DIAGNOSIS — G43909 Migraine, unspecified, not intractable, without status migrainosus: Secondary | ICD-10-CM | POA: Insufficient documentation

## 2015-04-28 DIAGNOSIS — Y9289 Other specified places as the place of occurrence of the external cause: Secondary | ICD-10-CM | POA: Insufficient documentation

## 2015-04-28 DIAGNOSIS — Z8739 Personal history of other diseases of the musculoskeletal system and connective tissue: Secondary | ICD-10-CM | POA: Insufficient documentation

## 2015-04-28 DIAGNOSIS — S39012A Strain of muscle, fascia and tendon of lower back, initial encounter: Secondary | ICD-10-CM | POA: Insufficient documentation

## 2015-04-28 MED ORDER — OXYCODONE-ACETAMINOPHEN 5-325 MG PO TABS
1.0000 | ORAL_TABLET | Freq: Once | ORAL | Status: AC
  Administered 2015-04-28: 1 via ORAL
  Filled 2015-04-28: qty 1

## 2015-04-28 MED ORDER — CYCLOBENZAPRINE HCL 10 MG PO TABS
10.0000 mg | ORAL_TABLET | Freq: Once | ORAL | Status: AC
  Administered 2015-04-28: 10 mg via ORAL
  Filled 2015-04-28: qty 1

## 2015-04-28 NOTE — ED Notes (Signed)
Pt states lower back pain. Denies any urinary symptoms. Denies any NVD at present.

## 2015-04-28 NOTE — ED Provider Notes (Signed)
CSN: [id number]     Arrival date & time 04/28/15  2243 History   First MD Initiated Contact with Patient 04/28/15 2252     Chief Complaint  Patient presents with  . Back Pain     (Consider location/radiation/quality/duration/timing/severity/associated sxs/prior Treatment) Patient is a 39 y.o. female presenting with back pain. The history is provided by the patient.  Back Pain Location:  Lumbar spine Quality:  Aching Radiates to:  Does not radiate Pain severity:  Moderate Onset quality:  Gradual Duration:  4 days Timing:  Constant Progression:  Worsening Chronicity:  New Relieved by:  Nothing Worsened by:  Ambulation, movement and bending Associated symptoms: no bladder incontinence and no bowel incontinence    [name] is a 39 y.o. female who presents to the ED with low back pain. She states that the pain started 4 days ago. She does not remember any injury. She has been taking ibuprofen without relief. She denies UTI symptoms. No loss of control of bladder or bowels.  Past Medical History  Diagnosis Date  . Obesity     gastric Bypass, Roux-en Y  . Migraines   . Frozen shoulder syndrome   . Depression   . Chronic knee pain   . Chronic left shoulder pain   . Chronic neck pain   . Patellofemoral syndrome, left   . Anxiety    Past Surgical History  Procedure Laterality Date  . Gastric bypass  04/20/2011    [place], wt 271lb preop  . Fertility surgery  2009    No abnormalities in female   Family History  Problem Relation Age of Onset  . Diabetes Father   . Hyperlipidemia Father   . Hypertension Father   . Cancer Father     skin cancer  . Depression Mother     lifelong  . Anesthesia problems Neg Hx   . Arthritis     History  Substance Use Topics  . Smoking status: Never Smoker   . Smokeless tobacco: Never Used  . Alcohol Use: No   OB History    Gravida Para Term Preterm AB TAB SAB Ectopic Multiple Living   3 3 3  0 0 0 0 0 0 3     Review  of Systems  Gastrointestinal: Negative for bowel incontinence.  Genitourinary: Negative for bladder incontinence.  Musculoskeletal: Positive for back pain.  all other systems negative    Allergies  Morphine and related; Nsaids; Penicillins; and Tramadol  Home Medications   Prior to Admission medications   Medication Sig Start Date End Date Taking? Authorizing Provider  ALPRAZolam Duanne Moron) 0.5 MG tablet TAKE ONE TABLET BY MOUTH THREE TIMES DAILY AS NEEDED FOR SLEEP/ANXIETY 03/08/15  Yes Susy Frizzle, MD  acetaminophen (TYLENOL) 325 MG tablet Take 650 mg by mouth every 6 (six) hours as needed (pain).    Historical Provider, MD  cyclobenzaprine (FLEXERIL) 10 MG tablet Take 1 tablet (10 mg total) by mouth 2 (two) times daily as needed for muscle spasms. 04/29/15   Cortlynn Hollinsworth Bunnie Pion, NP  HYDROcodone-acetaminophen (NORCO/VICODIN) 5-325 MG per tablet Take 1 tablet by mouth every 4 (four) hours as needed. 04/29/15   Emelee Rodocker Bunnie Pion, NP   BP 123/76 mmHg  Pulse 95  Resp 16  SpO2 99%  LMP 03/13/2015 Physical Exam  Constitutional: She is oriented to person, place, and time. She appears well-developed and well-nourished. No distress.  HENT:  Head: Normocephalic and atraumatic.  Nose: Nose normal.  Eyes: EOM are normal.  Neck: Normal range of motion. Neck supple.  Cardiovascular: Normal rate and regular rhythm.   Pulmonary/Chest: Effort normal. She has no wheezes. She has no rales.  Abdominal: Soft. Bowel sounds are normal. There is no tenderness.  Musculoskeletal: Normal range of motion.       Lumbar back: She exhibits tenderness, pain and spasm. She exhibits normal pulse.  Neurological: She is alert and oriented to person, place, and time. She has normal strength. No cranial nerve deficit or sensory deficit. Gait normal.  Reflex Scores:      Bicep reflexes are 2+ on the right side and 2+ on the left side.      Brachioradialis reflexes are 2+ on the right side and 2+ on the left side.       Patellar reflexes are 2+ on the right side and 2+ on the left side.      Achilles reflexes are 2+ on the right side and 2+ on the left side. Skin: Skin is warm and dry.  Psychiatric: She has a normal mood and affect. Her behavior is normal.  Nursing note and vitals reviewed.   ED Course  Procedures (including critical care time) Flexeril, percocet @ 1210 patient feeling better and ready to go home.  Labs Review  MDM  39 y.o. female with low back pain x 4 days. Stable for d/c without focal neuro deficits. Will treat with muscle relaxants and NSAIDS and she will follow up with her PCP or return here as needed for worsening symptoms.   Final diagnoses:  Lumbosacral strain, initial encounter       [facility], NP 04/29/15 0370  Jola Schmidt, MD 04/30/15 1536

## 2015-04-29 MED ORDER — CYCLOBENZAPRINE HCL 10 MG PO TABS: 10.0000 mg | ORAL_TABLET | Freq: Two times a day (BID) | ORAL | Status: DC | PRN

## 2015-04-29 MED ORDER — HYDROCODONE-ACETAMINOPHEN 5-325 MG PO TABS: 1.0000 | ORAL_TABLET | ORAL | Status: DC | PRN

## 2015-04-29 NOTE — Discharge Instructions (Signed)
Follow up with your primary care doctor. Return here for worsening symptoms. Do not take the medications if you are driving because they will make you sleepy.

## 2015-04-29 NOTE — ED Notes (Signed)
Pt alert & oriented x4, stable gait. Patient given discharge instructions, paperwork & prescription(s). Patient informed not to drive, operate any equipment & handel any important documents 4 hours after taking pain medication. Patient  instructed to stop at the registration desk to finish any additional paperwork. Patient  verbalized understanding. Pt left department w/ no further questions. 

## 2015-06-25 ENCOUNTER — Emergency Department (HOSPITAL_COMMUNITY)
Admission: EM | Admit: 2015-06-25 | Discharge: 2015-06-25 | Disposition: A | Discharge status: Home / Self Care | Payer: Managed Care, Other (non HMO) | Attending: Emergency Medicine | Admitting: Emergency Medicine

## 2015-06-25 ENCOUNTER — Encounter (HOSPITAL_COMMUNITY): Payer: Self-pay

## 2015-06-25 DIAGNOSIS — Z9884 Bariatric surgery status: Secondary | ICD-10-CM | POA: Insufficient documentation

## 2015-06-25 DIAGNOSIS — G43909 Migraine, unspecified, not intractable, without status migrainosus: Secondary | ICD-10-CM | POA: Insufficient documentation

## 2015-06-25 DIAGNOSIS — F419 Anxiety disorder, unspecified: Secondary | ICD-10-CM | POA: Insufficient documentation

## 2015-06-25 DIAGNOSIS — Z8739 Personal history of other diseases of the musculoskeletal system and connective tissue: Secondary | ICD-10-CM | POA: Insufficient documentation

## 2015-06-25 DIAGNOSIS — Z8639 Personal history of other endocrine, nutritional and metabolic disease: Secondary | ICD-10-CM | POA: Insufficient documentation

## 2015-06-25 DIAGNOSIS — Z79899 Other long term (current) drug therapy: Secondary | ICD-10-CM | POA: Insufficient documentation

## 2015-06-25 DIAGNOSIS — G8929 Other chronic pain: Secondary | ICD-10-CM | POA: Insufficient documentation

## 2015-06-25 DIAGNOSIS — R590 Localized enlarged lymph nodes: Secondary | ICD-10-CM

## 2015-06-25 DIAGNOSIS — F329 Major depressive disorder, single episode, unspecified: Secondary | ICD-10-CM | POA: Insufficient documentation

## 2015-06-25 DIAGNOSIS — Z880 Allergy status to penicillin: Secondary | ICD-10-CM | POA: Insufficient documentation

## 2015-06-25 MED ORDER — ACETAMINOPHEN 325 MG PO TABS: 650.0000 mg | ORAL_TABLET | Freq: Four times a day (QID) | ORAL | Status: DC | PRN

## 2015-06-25 MED ORDER — AZITHROMYCIN 250 MG PO TABS: 250.0000 mg | ORAL_TABLET | Freq: Every day | ORAL | Status: DC

## 2015-06-25 NOTE — ED Notes (Signed)
Pt left ED with no signs of distress. Pt verbalized discharge instructions.

## 2015-06-25 NOTE — Discharge Instructions (Signed)

## 2015-06-25 NOTE — ED Provider Notes (Signed)
CSN: [id number]     Arrival date & time 06/25/15  2159 History   First MD Initiated Contact with Patient 06/25/15 2318     Chief Complaint  Patient presents with  . Abscess     (Consider location/radiation/quality/duration/timing/severity/associated sxs/prior Treatment) HPI   39 year old obese female with history of chronic pain and recurrent migraine presents for evaluation of "lump behind left ear".  Patient notice when she brushed her fingers around her right ear this afternoon she noticed a lump that is painful. She has not noticed this before. She denies having any fever, ear pain, headache, runny nose, sneezing, coughing, neck pain, or rash. She denies any trauma. She denies any injury or drainage. No specific treatment tried. Pain is mild.  Past Medical History  Diagnosis Date  . Obesity     gastric Bypass, Roux-en Y  . Migraines   . Frozen shoulder syndrome   . Depression   . Chronic knee pain   . Chronic left shoulder pain   . Chronic neck pain   . Patellofemoral syndrome, left   . Anxiety    Past Surgical History  Procedure Laterality Date  . Gastric bypass  04/20/2011    [place], wt 271lb preop  . Fertility surgery  2009    No abnormalities in female   Family History  Problem Relation Age of Onset  . Diabetes Father   . Hyperlipidemia Father   . Hypertension Father   . Cancer Father     skin cancer  . Depression Mother     lifelong  . Anesthesia problems Neg Hx   . Arthritis     History  Substance Use Topics  . Smoking status: Never Smoker   . Smokeless tobacco: Never Used  . Alcohol Use: No   OB History    Gravida Para Term Preterm AB TAB SAB Ectopic Multiple Living   3 3 3  0 0 0 0 0 0 3     Review of Systems  Constitutional: Negative for fever.  HENT: Negative for ear discharge and ear pain.   Skin: Negative for rash.      Allergies  Morphine and related; Nsaids; Penicillins; and Tramadol  Home Medications   Prior to Admission  medications   Medication Sig Start Date End Date Taking? Authorizing Provider  acetaminophen (TYLENOL) 325 MG tablet Take 650 mg by mouth every 6 (six) hours as needed (pain).    Historical Provider, MD  ALPRAZolam Duanne Moron) 0.5 MG tablet TAKE ONE TABLET BY MOUTH THREE TIMES DAILY AS NEEDED FOR SLEEP/ANXIETY 03/08/15   Susy Frizzle, MD  cyclobenzaprine (FLEXERIL) 10 MG tablet Take 1 tablet (10 mg total) by mouth 2 (two) times daily as needed for muscle spasms. 04/29/15   Hope Bunnie Pion, NP  HYDROcodone-acetaminophen (NORCO/VICODIN) 5-325 MG per tablet Take 1 tablet by mouth every 4 (four) hours as needed. 04/29/15   Hope Bunnie Pion, NP   BP 121/77 mmHg  Pulse 80  Temp(Src) 98.7 F (37.1 C) (Oral)  Resp 18  Ht 5\' 5"  (1.651 m)  Wt 180 lb (81.647 kg)  BMI 29.95 kg/m2  SpO2 100%  LMP 05/25/2015 Physical Exam  Constitutional: She appears well-developed and well-nourished. No distress.  HENT:  Head: Atraumatic.  Right Ear: External ear normal.  Left Ear: External ear normal.  Post auricular lymphadenopathy without surrounding erythema. Ears are normal in appearance. No other lymph nodes appreciated.  Eyes: Conjunctivae are normal.  Neck: Neck supple.  No nuchal rigidity.  Lymphadenopathy:  She has no cervical adenopathy.  Neurological: She is alert.  Skin: No rash noted.  Psychiatric: She has a normal mood and affect.  Nursing note and vitals reviewed.   ED Course  Procedures (including critical care time)  Patient with post auricular lymph node without any obvious infectious source. Discussed with Dr. Roxanne Mins.  Will provide tylenol, and keflex.  Pt to f/u with PCP for further care.  Labs Review Labs Reviewed - No data to display  Imaging Review No results found.   EKG Interpretation None      MDM   Final diagnoses:  Lymphadenopathy, postauricular    BP 121/77 mmHg  Pulse 80  Temp(Src) 98.7 F (37.1 C) (Oral)  Resp 18  Ht 5\' 5"  (1.651 m)  Wt 180 lb (81.647 kg)  BMI  29.95 kg/m2  SpO2 100%  LMP 05/25/2015     Domenic Moras, PA-C 09/64/38 3818  Delora Fuel, MD 40/37/54 3606

## 2015-06-25 NOTE — ED Notes (Signed)
C/o "lump" behind left ear. Patient denies injury or drainage.

## 2015-06-27 ENCOUNTER — Emergency Department (HOSPITAL_COMMUNITY)
Admission: EM | Admit: 2015-06-27 | Discharge: 2015-06-27 | Discharge status: Against Medical Advice | Payer: Managed Care, Other (non HMO) | Attending: Emergency Medicine | Admitting: Emergency Medicine

## 2015-06-27 ENCOUNTER — Encounter (HOSPITAL_COMMUNITY): Payer: Self-pay

## 2015-06-27 ENCOUNTER — Emergency Department (HOSPITAL_COMMUNITY)
Admission: EM | Admit: 2015-06-27 | Discharge: 2015-06-27 | Disposition: A | Discharge status: Home / Self Care | Payer: Managed Care, Other (non HMO) | Attending: Emergency Medicine | Admitting: Emergency Medicine

## 2015-06-27 DIAGNOSIS — F419 Anxiety disorder, unspecified: Secondary | ICD-10-CM | POA: Insufficient documentation

## 2015-06-27 DIAGNOSIS — R591 Generalized enlarged lymph nodes: Secondary | ICD-10-CM

## 2015-06-27 DIAGNOSIS — Z8639 Personal history of other endocrine, nutritional and metabolic disease: Secondary | ICD-10-CM | POA: Insufficient documentation

## 2015-06-27 DIAGNOSIS — Z8739 Personal history of other diseases of the musculoskeletal system and connective tissue: Secondary | ICD-10-CM | POA: Insufficient documentation

## 2015-06-27 DIAGNOSIS — G8929 Other chronic pain: Secondary | ICD-10-CM | POA: Insufficient documentation

## 2015-06-27 DIAGNOSIS — Z9884 Bariatric surgery status: Secondary | ICD-10-CM | POA: Insufficient documentation

## 2015-06-27 DIAGNOSIS — R221 Localized swelling, mass and lump, neck: Secondary | ICD-10-CM | POA: Insufficient documentation

## 2015-06-27 DIAGNOSIS — E669 Obesity, unspecified: Secondary | ICD-10-CM | POA: Insufficient documentation

## 2015-06-27 DIAGNOSIS — Z880 Allergy status to penicillin: Secondary | ICD-10-CM | POA: Insufficient documentation

## 2015-06-27 DIAGNOSIS — G43909 Migraine, unspecified, not intractable, without status migrainosus: Secondary | ICD-10-CM | POA: Insufficient documentation

## 2015-06-27 DIAGNOSIS — F329 Major depressive disorder, single episode, unspecified: Secondary | ICD-10-CM | POA: Insufficient documentation

## 2015-06-27 DIAGNOSIS — R590 Localized enlarged lymph nodes: Secondary | ICD-10-CM | POA: Insufficient documentation

## 2015-06-27 DIAGNOSIS — Z792 Long term (current) use of antibiotics: Secondary | ICD-10-CM | POA: Insufficient documentation

## 2015-06-27 NOTE — Discharge Instructions (Signed)
Please continue to take tylenol or ibuprofen for pain.  Continue with the rest of your antibiotic.  If you develop high fever, severe headache, confusion then return for further evaluation.  You may follow up with ENT specialist for further care.    Lymphadenopathy Lymphadenopathy means "disease of the lymph glands." But the term is usually used to describe swollen or enlarged lymph glands, also called lymph nodes. These are the bean-shaped organs found in many locations including the neck, underarm, and groin. Lymph glands are part of the immune system, which fights infections in your body. Lymphadenopathy can occur in just one area of the body, such as the neck, or it can be generalized, with lymph node enlargement in several areas. The nodes found in the neck are the most common sites of lymphadenopathy. CAUSES When your immune system responds to germs (such as viruses or bacteria ), infection-fighting cells and fluid build up. This causes the glands to grow in size. Usually, this is not something to worry about. Sometimes, the glands themselves can become infected and inflamed. This is called lymphadenitis. Enlarged lymph nodes can be caused by many diseases:  Bacterial disease, such as strep throat or a skin infection.  Viral disease, such as a common cold.  Other germs, such as Lyme disease, tuberculosis, or sexually transmitted diseases.  Cancers, such as lymphoma (cancer of the lymphatic system) or leukemia (cancer of the white blood cells).  Inflammatory diseases such as lupus or rheumatoid arthritis.  Reactions to medications. Many of the diseases above are rare, but important. This is why you should see your caregiver if you have lymphadenopathy. SYMPTOMS  Swollen, enlarged lumps in the neck, back of the head, or other locations.  Tenderness.  Warmth or redness of the skin over the lymph nodes.  Fever. DIAGNOSIS Enlarged lymph nodes are often near the source of infection.  They can help health care providers diagnose your illness. For instance:  Swollen lymph nodes around the jaw might be caused by an infection in the mouth.  Enlarged glands in the neck often signal a throat infection.  Lymph nodes that are swollen in more than one area often indicate an illness caused by a virus. Your caregiver will likely know what is causing your lymphadenopathy after listening to your history and examining you. Blood tests, x-rays, or other tests may be needed. If the cause of the enlarged lymph node cannot be found, and it does not go away by itself, then a biopsy may be needed. Your caregiver will discuss this with you. TREATMENT Treatment for your enlarged lymph nodes will depend on the cause. Many times the nodes will shrink to normal size by themselves, with no treatment. Antibiotics or other medicines may be needed for infection. Only take over-the-counter or prescription medicines for pain, discomfort, or fever as directed by your caregiver. HOME CARE INSTRUCTIONS Swollen lymph glands usually return to normal when the underlying medical condition goes away. If they persist, contact your health-care provider. He/she might prescribe antibiotics or other treatments, depending on the diagnosis. Take any medications exactly as prescribed. Keep any follow-up appointments made to check on the condition of your enlarged nodes. SEEK MEDICAL CARE IF:  Swelling lasts for more than two weeks.  You have symptoms such as weight loss, night sweats, fatigue, or fever that does not go away.  The lymph nodes are hard, seem fixed to the skin, or are growing rapidly.  Skin over the lymph nodes is red and inflamed. This could  mean there is an infection. SEEK IMMEDIATE MEDICAL CARE IF:  Fluid starts leaking from the area of the enlarged lymph node.  You develop a fever of 102 F (38.9 C) or greater.  Severe pain develops (not necessarily at the site of a large lymph node).  You  develop chest pain or shortness of breath.  You develop worsening abdominal pain. MAKE SURE YOU:  Understand these instructions.  Will watch your condition.  Will get help right away if you are not doing well or get worse. Document Released: 09/25/2008 Document Revised: 05/03/2014 Document Reviewed: 09/25/2008 [facility] Patient Information 2015 [place], [place]. This information is not intended to replace advice given to you by your health care provider. Make sure you discuss any questions you have with your health care provider.

## 2015-06-27 NOTE — ED Provider Notes (Signed)
CSN: [id number]     Arrival date & time 06/27/15  1827 History  This chart was scribed for non-physician practitioner, Domenic Moras, PA-C, working with No att. providers found, by Sima Matas, ED Scribe. This patient was seen in room WTR5/WTR5 and the patient's care was started at 6:51 PM.    Chief Complaint  Patient presents with  . Abscess    The history is provided by the patient. No language interpreter was used.   HPI Comments: [name] is a 39 y.o. female with hx of who presents to the Emergency Department complaining of multiple abscesses. Patient was seen at [facility] 2 days ago for a painful abscess behind the right ear, diagnosed with Lymphadenopathy, and given abx. She returned to [facility] twice earlier today and was unable to be seen, prompting her visit today.  She claims there are new abscesses in her neck.  Sts pain is minimal.  Patient denies fever, rhinorrhea, cough, night sweats, abnormal weight loss.  No hx of active cancer.  No scalp tenderness or rash.  She has been taking her abx as prescribed.  She request for blood work.  She does not have a PCP at this time due to changes in insurance.   Past Medical History  Diagnosis Date  . Obesity     gastric Bypass, Roux-en Y  . Migraines   . Frozen shoulder syndrome   . Depression   . Chronic knee pain   . Chronic left shoulder pain   . Chronic neck pain   . Patellofemoral syndrome, left   . Anxiety    Past Surgical History  Procedure Laterality Date  . Gastric bypass  04/20/2011    [place], wt 271lb preop  . Fertility surgery  2009    No abnormalities in female   Family History  Problem Relation Age of Onset  . Diabetes Father   . Hyperlipidemia Father   . Hypertension Father   . Cancer Father     skin cancer  . Depression Mother     lifelong  . Anesthesia problems Neg Hx   . Arthritis     History  Substance Use Topics  . Smoking status: Never Smoker   . Smokeless tobacco: Never Used  . Alcohol  Use: No   OB History    Gravida Para Term Preterm AB TAB SAB Ectopic Multiple Living   3 3 3  0 0 0 0 0 0 3     Review of Systems  Constitutional: Negative for fever.  HENT: Negative for rhinorrhea.   Respiratory: Negative for cough.       Allergies  Morphine and related; Nsaids; Penicillins; and Tramadol  Home Medications   Prior to Admission medications   Medication Sig Start Date End Date Taking? Authorizing Provider  acetaminophen (TYLENOL) 325 MG tablet Take 2 tablets (650 mg total) by mouth every 6 (six) hours as needed for moderate pain (pain). 06/25/15   Domenic Moras, PA-C  ALPRAZolam Duanne Moron) 0.5 MG tablet TAKE ONE TABLET BY MOUTH THREE TIMES DAILY AS NEEDED FOR SLEEP/ANXIETY 03/08/15   Susy Frizzle, MD  azithromycin (ZITHROMAX) 250 MG tablet Take 1 tablet (250 mg total) by mouth daily. 06/25/15   Domenic Moras, PA-C  cyclobenzaprine (FLEXERIL) 10 MG tablet Take 1 tablet (10 mg total) by mouth 2 (two) times daily as needed for muscle spasms. 04/29/15   Hope Bunnie Pion, NP  HYDROcodone-acetaminophen (NORCO/VICODIN) 5-325 MG per tablet Take 1 tablet by mouth every 4 (four) hours  as needed. 04/29/15   Hope Bunnie Pion, NP   Triage Vitals: BP 113/66 mmHg  Pulse 81  Temp(Src) 98.7 F (37.1 C) (Oral)  Resp 19  SpO2 100%  LMP 05/25/2015 Physical Exam  Constitutional: She is oriented to person, place, and time. She appears well-developed and well-nourished. No distress.  Caucasian female appears to be in no acute distress, nontoxic in appearance  HENT:  Head: Normocephalic and atraumatic.  Ears: normal TM Nose: normal nares Throat: Uvula midline.  No tonsilar enlargement or exudate    Eyes: Conjunctivae and EOM are normal.  Neck: Normal range of motion. Neck supple. No tracheal deviation present.  Full ROM No nuchal rigidity  Right posterior lymphadenopathy without evidence of mastoiditis Anterior cervical lymph adenoidopathy w/ tenderness to palpation  Cardiovascular: Normal  rate, regular rhythm and normal heart sounds.   Pulmonary/Chest: Effort normal and breath sounds normal. No respiratory distress.  Abdominal: Soft. There is no tenderness.  Musculoskeletal: Normal range of motion.  No scalp tenderness  Lymphadenopathy:    She has cervical adenopathy.  Neurological: She is alert and oriented to person, place, and time.  Skin: Skin is warm and dry.  Psychiatric: She has a normal mood and affect. Her behavior is normal.  Nursing note and vitals reviewed.   ED Course  Procedures  DIAGNOSTIC STUDIES: Oxygen Saturation is 100% on room air, normal by my interpretation.    COORDINATION OF CARE:  Patient presents with right posterior articular lymphadenopathy and new anterior cervical chain lymphadenopathy. No evidence of abscess. No obvious signs of infection aside from reactive lymph nodes. She is afebrile, no nuchal rigidity, no signs of meningitis, and no other acute finding. I encouraged patient to take Tylenol or ibuprofen for her symptoms. She can continue finishing her Keflex as previously prescribed.  Patient does request for blood work however I explained to patient that blood work would not provide any additional change in management. I recommend patient to follow-up with PCP for further care. Return precautions discussed.  Labs Review Labs Reviewed - No data to display  Imaging Review No results found.   EKG Interpretation None      MDM   Final diagnoses:  Lymphadenopathy of head and neck    BP 107/69 mmHg  Pulse 87  Temp(Src) 98.7 F (37.1 C) (Oral)  Resp 18  Ht 5\' 5"  (1.651 m)  Wt 180 lb (81.647 kg)  BMI 29.95 kg/m2  SpO2 99%  LMP 05/25/2015  I personally performed the services described in this documentation, which was scribed in my presence. The recorded information has been reviewed and is accurate.      Domenic Moras, PA-C 06/27/15 2006  Daleen Bo, MD 06/27/15 [phone]

## 2015-06-27 NOTE — ED Notes (Signed)
Pt c/o o flump behing rt ear. Seen at [facility] ED 2 days ago DX   Lymphadenopathy and given antibiotic. Went back twice today unable to be seen. Here for re evaluation. No new symptoms

## 2015-07-04 IMAGING — CR DG CHEST 2V
2 series · 2 of 2 positions shown · non-contrast
Comparison: 10/06/2006

CLINICAL DATA: Left-sided chest pain and palpitations

EXAM:
CHEST  2 VIEW

[view not recorded (1 of 2)]
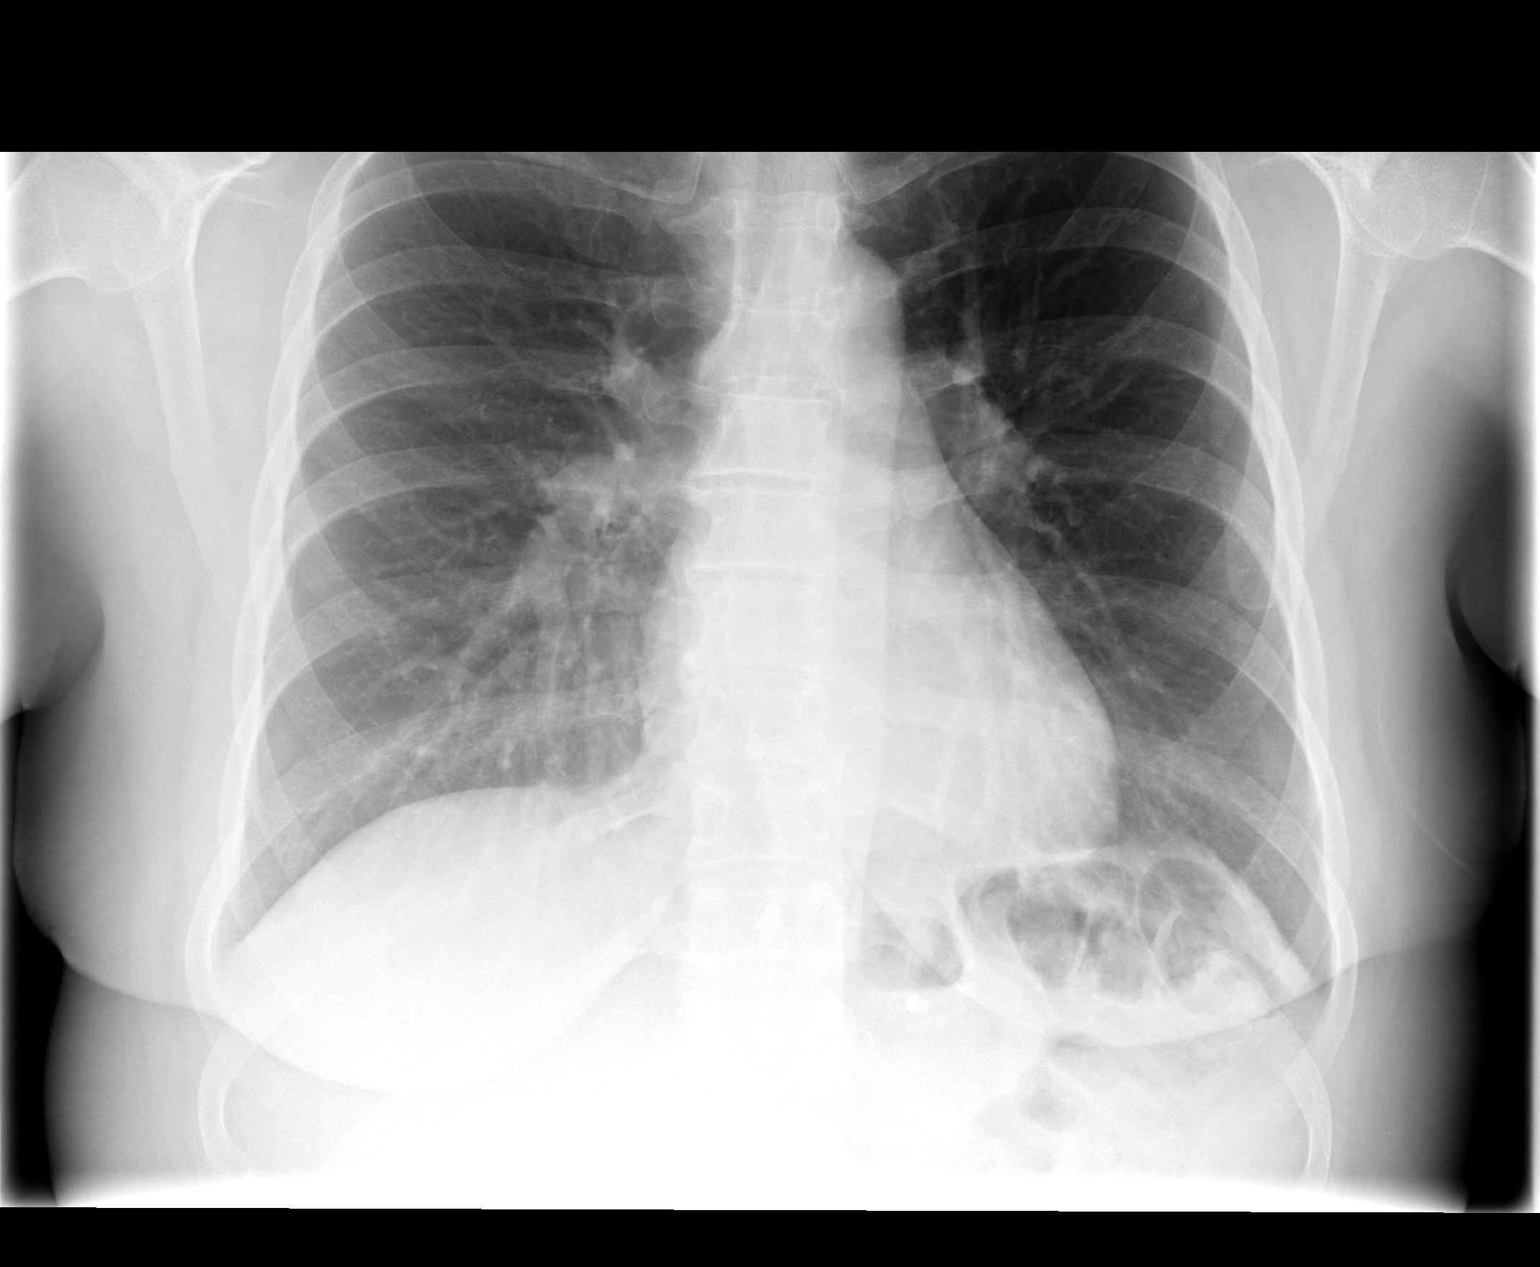

[view not recorded (2 of 2)]
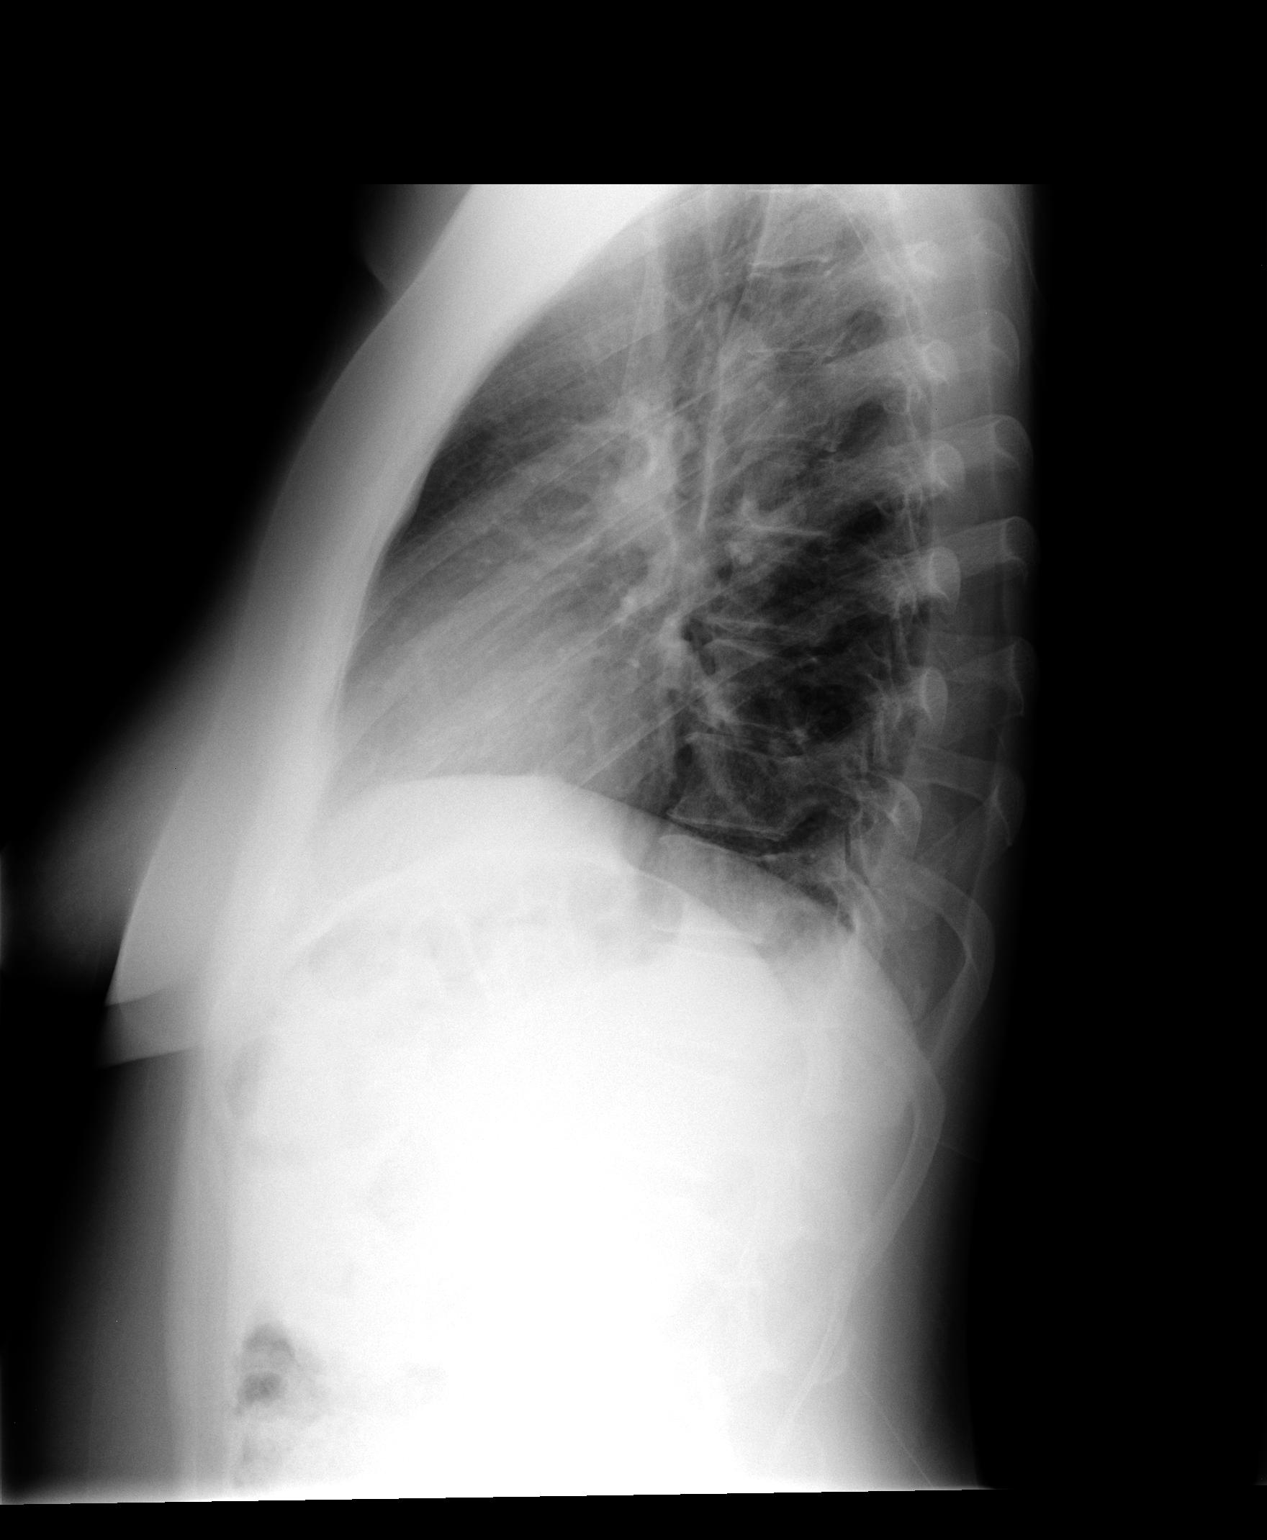

[2 of 2 positions shown; findings below may reference images not displayed]

FINDINGS: The heart size and mediastinal contours are within normal limits.
Both lungs are clear. The visualized skeletal structures are
unremarkable.
IMPRESSION: No active cardiopulmonary disease.

## 2015-07-26 ENCOUNTER — Other Ambulatory Visit (HOSPITAL_COMMUNITY): Payer: Self-pay | Admitting: *Deleted

## 2015-07-26 DIAGNOSIS — R229 Localized swelling, mass and lump, unspecified: Principal | ICD-10-CM

## 2015-07-26 DIAGNOSIS — N6310 Unspecified lump in the right breast, unspecified quadrant: Secondary | ICD-10-CM

## 2015-07-26 DIAGNOSIS — IMO0002 Reserved for concepts with insufficient information to code with codable children: Secondary | ICD-10-CM

## 2015-08-02 ENCOUNTER — Ambulatory Visit (HOSPITAL_COMMUNITY)
Admission: RE | Admit: 2015-08-02 | Discharge: 2015-08-02 | Disposition: A | Discharge status: Home / Self Care | Payer: PRIVATE HEALTH INSURANCE | Source: Ambulatory Visit | Attending: *Deleted | Admitting: *Deleted

## 2015-08-02 ENCOUNTER — Encounter (HOSPITAL_COMMUNITY): Payer: Managed Care, Other (non HMO)

## 2015-08-02 DIAGNOSIS — N6310 Unspecified lump in the right breast, unspecified quadrant: Secondary | ICD-10-CM

## 2015-08-02 DIAGNOSIS — N63 Unspecified lump in breast: Secondary | ICD-10-CM | POA: Insufficient documentation

## 2015-09-01 ENCOUNTER — Encounter (HOSPITAL_COMMUNITY): Payer: Self-pay | Admitting: *Deleted

## 2015-09-01 ENCOUNTER — Emergency Department (HOSPITAL_COMMUNITY)
Admission: EM | Admit: 2015-09-01 | Discharge: 2015-09-02 | Disposition: A | Discharge status: Home / Self Care | Payer: Self-pay | Attending: Emergency Medicine | Admitting: Emergency Medicine

## 2015-09-01 DIAGNOSIS — Z8679 Personal history of other diseases of the circulatory system: Secondary | ICD-10-CM | POA: Insufficient documentation

## 2015-09-01 DIAGNOSIS — G8929 Other chronic pain: Secondary | ICD-10-CM | POA: Insufficient documentation

## 2015-09-01 DIAGNOSIS — E669 Obesity, unspecified: Secondary | ICD-10-CM | POA: Insufficient documentation

## 2015-09-01 DIAGNOSIS — D508 Other iron deficiency anemias: Secondary | ICD-10-CM | POA: Insufficient documentation

## 2015-09-01 DIAGNOSIS — Z880 Allergy status to penicillin: Secondary | ICD-10-CM | POA: Insufficient documentation

## 2015-09-01 DIAGNOSIS — R55 Syncope and collapse: Secondary | ICD-10-CM | POA: Insufficient documentation

## 2015-09-01 DIAGNOSIS — Z8739 Personal history of other diseases of the musculoskeletal system and connective tissue: Secondary | ICD-10-CM | POA: Insufficient documentation

## 2015-09-01 LAB — I-STAT CHEM 8, ED
BUN: 11 mg/dL (ref 6–20)
CHLORIDE: 104 mmol/L (ref 101–111)
CREATININE: 0.6 mg/dL (ref 0.44–1.00)
Calcium, Ion: 1.15 mmol/L (ref 1.12–1.23)
Glucose, Bld: 78 mg/dL (ref 65–99)
HEMATOCRIT: 23 % — AB (ref 36.0–46.0)
Hemoglobin: 7.8 g/dL — ABNORMAL LOW (ref 12.0–15.0)
Potassium: 3.6 mmol/L (ref 3.5–5.1)
SODIUM: 142 mmol/L (ref 135–145)
TCO2: 23 mmol/L (ref 0–100)

## 2015-09-01 NOTE — ED Notes (Signed)
Pt states she was standing tonight & passed out.

## 2015-09-01 NOTE — Discharge Instructions (Signed)
Anemia, Nonspecific Anemia is a condition in which the concentration of red blood cells or hemoglobin in the blood is below normal. Hemoglobin is a substance in red blood cells that carries oxygen to the tissues of the body. Anemia results in not enough oxygen reaching these tissues.  CAUSES  Common causes of anemia include:   Excessive bleeding. Bleeding may be internal or external. This includes excessive bleeding from periods (in women) or from the intestine.   Poor nutrition.   Chronic kidney, thyroid, and liver disease.  Bone marrow disorders that decrease red blood cell production.  Cancer and treatments for cancer.  HIV, AIDS, and their treatments.  Spleen problems that increase red blood cell destruction.  Blood disorders.  Excess destruction of red blood cells due to infection, medicines, and autoimmune disorders. SIGNS AND SYMPTOMS   Minor weakness.   Dizziness.   Headache.  Palpitations.   Shortness of breath, especially with exercise.   Paleness.  Cold sensitivity.  Indigestion.  Nausea.  Difficulty sleeping.  Difficulty concentrating. Symptoms may occur suddenly or they may develop slowly.  DIAGNOSIS  Additional blood tests are often needed. These help your health care provider determine the best treatment. Your health care provider will check your stool for blood and look for other causes of blood loss.  TREATMENT  Treatment varies depending on the cause of the anemia. Treatment can include:   Supplements of iron, vitamin T06, or folic acid.   Hormone medicines.   A blood transfusion. This may be needed if blood loss is severe.   Hospitalization. This may be needed if there is significant continual blood loss.   Dietary changes.  Spleen removal. HOME CARE INSTRUCTIONS Keep all follow-up appointments. It often takes many weeks to correct anemia, and having your health care provider check on your condition and your response to  treatment is very important. SEEK IMMEDIATE MEDICAL CARE IF:   You develop extreme weakness, shortness of breath, or chest pain.   You become dizzy or have trouble concentrating.  You develop heavy vaginal bleeding.   You develop a rash.   You have bloody or black, tarry stools.   You faint.   You vomit up blood.   You vomit repeatedly.   You have abdominal pain.  You have a fever or persistent symptoms for more than 2-3 days.   You have a fever and your symptoms suddenly get worse.   You are dehydrated.  MAKE SURE YOU:  Understand these instructions.  Will watch your condition.  Will get help right away if you are not doing well or get worse. Document Released: 01/24/2005 Document Revised: 08/19/2013 Document Reviewed: 06/12/2013 [facility] Patient Information 2015 [place], [place]. This information is not intended to replace advice given to you by your health care provider. Make sure you discuss any questions you have with your health care provider.   I suggest taking an antacid tablet (pepcid or zantac) but only take 4 hours before or 2 hours after taking your iron supplement (as this can reduce the iron absorption if taken too close to the supplement).

## 2015-09-01 NOTE — ED Notes (Signed)
Pt reports that she saw her physician today and was told she was anemic.  Reports that she had blood drawn and passed out when she got home.

## 2015-09-01 NOTE — ED Provider Notes (Signed)
CSN: [id number]     Arrival date & time 09/01/15  2055 History   First MD Initiated Contact with Patient 09/01/15 2136     No chief complaint on file.    (Consider location/radiation/quality/duration/timing/severity/associated sxs/prior Treatment) The history is provided by the patient.   [name] is a 39 y.o. female with a history of gastric bypass surgery and chronic anemia, worse since her surgery which occurred years ago when living in [place], presenting with episode of syncope occuring 2 hours before arrival.  She reports stood up and was walked to her kitchen, had been standing for 1-2 minutes when she became lightheaded followed by syncope onto her floor.  She was home with her 72 year old son at the time of the incident and had to wait for her husband to get off of work to arrive here.  She denies injury or pain since the fall.  She was seen by the health department today where she had blood work drawn, currently pending, but states a finger stick hgb was 6 g/dl. She denies having chest pain, sob, palpitations, headache  prior to the event.  She is supposed to take iron supplementation and has a otc chewable product recommended by her surgeon which she does not tolerate as the citric acid (for vit C) upsets her stomach, therefore she does not take it.  She also endorses is trying to give up meat in her diet, but has been increasing leafy green vegetables and other healthy vitamin sources.  LMP 3 days ago and normal.  Denies heavy periods.    Past Medical History  Diagnosis Date  . Obesity     gastric Bypass, Roux-en Y  . Migraines   . Frozen shoulder syndrome   . Depression   . Chronic knee pain   . Chronic left shoulder pain   . Chronic neck pain   . Patellofemoral syndrome, left   . Anxiety    Past Surgical History  Procedure Laterality Date  . Gastric bypass  04/20/2011    [place], wt 271lb preop  . Fertility surgery  2009    No abnormalities in female    Family History  Problem Relation Age of Onset  . Diabetes Father   . Hyperlipidemia Father   . Hypertension Father   . Cancer Father     skin cancer  . Depression Mother     lifelong  . Anesthesia problems Neg Hx   . Arthritis     Social History  Substance Use Topics  . Smoking status: Never Smoker   . Smokeless tobacco: Never Used  . Alcohol Use: No   OB History    Gravida Para Term Preterm AB TAB SAB Ectopic Multiple Living   3 3 3  0 0 0 0 0 0 3     Review of Systems  Constitutional: Positive for fatigue. Negative for fever.  HENT: Negative for congestion and sore throat.   Eyes: Negative.   Respiratory: Negative for chest tightness and shortness of breath.   Cardiovascular: Negative for chest pain and palpitations.  Gastrointestinal: Negative for nausea and abdominal pain.  Genitourinary: Negative.   Musculoskeletal: Negative for joint swelling, arthralgias and neck pain.  Skin: Negative.  Negative for rash and wound.  Neurological: Positive for syncope. Negative for dizziness, weakness, light-headedness, numbness and headaches.  Psychiatric/Behavioral: Negative.       Allergies  Morphine and related; Nsaids; Penicillins; and Tramadol  Home Medications   Prior to Admission medications  Medication Sig Start Date End Date Taking? Authorizing Provider  acetaminophen (TYLENOL) 325 MG tablet Take 2 tablets (650 mg total) by mouth every 6 (six) hours as needed for moderate pain (pain). 06/25/15   Domenic Moras, PA-C   BP 102/69 mmHg  Pulse 67  Temp(Src) 97.7 F (36.5 C) (Oral)  Resp 20  Ht 5\' 5"  (1.651 m)  Wt 180 lb (81.647 kg)  BMI 29.95 kg/m2  SpO2 98%  LMP 08/28/2015 Physical Exam  Constitutional: She is oriented to person, place, and time. She appears well-developed and well-nourished.  HENT:  Head: Normocephalic and atraumatic.  Eyes:  Mild conjunctival pallor.  Neck: Normal range of motion.  Cardiovascular: Normal rate, regular rhythm, normal heart  sounds and intact distal pulses.   Pulmonary/Chest: Effort normal and breath sounds normal. No respiratory distress. She has no wheezes.  Abdominal: Soft. Bowel sounds are normal. There is no tenderness.  Musculoskeletal: Normal range of motion.  Neurological: She is alert and oriented to person, place, and time.  Skin: Skin is warm and dry.  Psychiatric: She has a normal mood and affect.  Nursing note and vitals reviewed.   ED Course  Procedures (including critical care time) Labs Review Labs Reviewed  I-STAT CHEM 8, ED - Abnormal; Notable for the following:    Hemoglobin 7.8 (*)    HCT 23.0 (*)    All other components within normal limits    Imaging Review No results found. I have personally reviewed and evaluated these images and lab results as part of my medical decision-making.   EKG Interpretation   Date/Time:  Thursday September 01 2015 21:44:52 EDT Ventricular Rate:  73 PR Interval:  156 QRS Duration: 77 QT Interval:  368 QTC Calculation: 405 R Axis:   41 Text Interpretation:  Sinus rhythm Low voltage, precordial leads  Interpretation limited secondary to artifact Confirmed by ZACKOWSKI  MD,  SCOTT [phone]) on 09/01/2015 9:58:30 PM      MDM   Final diagnoses:  Syncope, unspecified syncope type  Other iron deficiency anemias    Syncope with iron deficiency anemai, stable per prior labs.  Discussed need for getting increased iron into her diet.  She states she cannot absorb all iron products, so I am hesitant to try a new iron prescription.  Advised to continue taking her chewable supplement, advised taking pepcid or zantac either 4 hours before, or 2 hours after there iron supplement to help with sx, yet not affect absorption.  She was given referral to Dr. Whitney Muse here for further management of her anemia.  Discussed possibility of blood transfusion if hgb drops further or she continues to be symptomatic.  Pt is resistant to this tx, although would consider iron  infusion.  Orthostatics obtained with no orthostasis  Or sx.    The patient appears reasonably screened and/or stabilized for discharge and I doubt any other medical condition or other [facility] requiring further screening, evaluation, or treatment in the ED at this time prior to discharge.     Evalee Jefferson, PA-C 09/02/15 1611  Fredia Sorrow, MD 09/07/15 [phone]

## 2015-09-03 ENCOUNTER — Emergency Department (HOSPITAL_COMMUNITY)
Admission: EM | Admit: 2015-09-03 | Discharge: 2015-09-03 | Disposition: A | Discharge status: Home / Self Care | Payer: Self-pay | Attending: Emergency Medicine | Admitting: Emergency Medicine

## 2015-09-03 ENCOUNTER — Encounter (HOSPITAL_COMMUNITY): Payer: Self-pay | Admitting: Emergency Medicine

## 2015-09-03 DIAGNOSIS — R51 Headache: Secondary | ICD-10-CM | POA: Insufficient documentation

## 2015-09-03 DIAGNOSIS — R519 Headache, unspecified: Secondary | ICD-10-CM

## 2015-09-03 DIAGNOSIS — Z8659 Personal history of other mental and behavioral disorders: Secondary | ICD-10-CM | POA: Insufficient documentation

## 2015-09-03 DIAGNOSIS — E669 Obesity, unspecified: Secondary | ICD-10-CM | POA: Insufficient documentation

## 2015-09-03 DIAGNOSIS — Z8739 Personal history of other diseases of the musculoskeletal system and connective tissue: Secondary | ICD-10-CM | POA: Insufficient documentation

## 2015-09-03 DIAGNOSIS — Z3202 Encounter for pregnancy test, result negative: Secondary | ICD-10-CM | POA: Insufficient documentation

## 2015-09-03 DIAGNOSIS — Z8679 Personal history of other diseases of the circulatory system: Secondary | ICD-10-CM | POA: Insufficient documentation

## 2015-09-03 DIAGNOSIS — G8929 Other chronic pain: Secondary | ICD-10-CM | POA: Insufficient documentation

## 2015-09-03 DIAGNOSIS — D649 Anemia, unspecified: Secondary | ICD-10-CM | POA: Insufficient documentation

## 2015-09-03 DIAGNOSIS — Z880 Allergy status to penicillin: Secondary | ICD-10-CM | POA: Insufficient documentation

## 2015-09-03 HISTORY — DX: Anemia, unspecified: D64.9

## 2015-09-03 LAB — CBC WITH DIFFERENTIAL/PLATELET
BASOS ABS: 0 10*3/uL (ref 0.0–0.1)
BASOS PCT: 1 % (ref 0–1)
EOS ABS: 0.3 10*3/uL (ref 0.0–0.7)
Eosinophils Relative: 5 % (ref 0–5)
HEMATOCRIT: 26.7 % — AB (ref 36.0–46.0)
HEMOGLOBIN: 7.8 g/dL — AB (ref 12.0–15.0)
Lymphocytes Relative: 29 % (ref 12–46)
Lymphs Abs: 1.8 10*3/uL (ref 0.7–4.0)
MCH: 19.8 pg — ABNORMAL LOW (ref 26.0–34.0)
MCHC: 29.2 g/dL — AB (ref 30.0–36.0)
MCV: 67.9 fL — ABNORMAL LOW (ref 78.0–100.0)
MONOS PCT: 6 % (ref 3–12)
Monocytes Absolute: 0.4 10*3/uL (ref 0.1–1.0)
NEUTROS ABS: 3.8 10*3/uL (ref 1.7–7.7)
NEUTROS PCT: 60 % (ref 43–77)
Platelets: 367 10*3/uL (ref 150–400)
RBC: 3.93 MIL/uL (ref 3.87–5.11)
RDW: 16.3 % — ABNORMAL HIGH (ref 11.5–15.5)
WBC: 6.3 10*3/uL (ref 4.0–10.5)

## 2015-09-03 LAB — BASIC METABOLIC PANEL
ANION GAP: 4 — AB (ref 5–15)
BUN: 11 mg/dL (ref 6–20)
CALCIUM: 8.4 mg/dL — AB (ref 8.9–10.3)
CO2: 27 mmol/L (ref 22–32)
CREATININE: 0.62 mg/dL (ref 0.44–1.00)
Chloride: 106 mmol/L (ref 101–111)
Glucose, Bld: 86 mg/dL (ref 65–99)
Potassium: 3.9 mmol/L (ref 3.5–5.1)
SODIUM: 137 mmol/L (ref 135–145)

## 2015-09-03 LAB — URINALYSIS, ROUTINE W REFLEX MICROSCOPIC
BILIRUBIN URINE: NEGATIVE
Glucose, UA: NEGATIVE mg/dL
Hgb urine dipstick: NEGATIVE
KETONES UR: NEGATIVE mg/dL
LEUKOCYTES UA: NEGATIVE
NITRITE: NEGATIVE
PH: 5 (ref 5.0–8.0)
Protein, ur: NEGATIVE mg/dL
Specific Gravity, Urine: 1.015 (ref 1.005–1.030)
UROBILINOGEN UA: 0.2 mg/dL (ref 0.0–1.0)

## 2015-09-03 LAB — PREGNANCY, URINE: PREG TEST UR: NEGATIVE

## 2015-09-03 MED ORDER — PROMETHAZINE HCL 25 MG PO TABS: 25.0000 mg | ORAL_TABLET | Freq: Four times a day (QID) | ORAL | Status: DC | PRN

## 2015-09-03 MED ORDER — ACETAMINOPHEN 500 MG PO TABS
1000.0000 mg | ORAL_TABLET | Freq: Once | ORAL | Status: AC
  Administered 2015-09-03: 1000 mg via ORAL
  Filled 2015-09-03: qty 2

## 2015-09-03 MED ORDER — METOCLOPRAMIDE HCL 5 MG/ML IJ SOLN
10.0000 mg | Freq: Once | INTRAMUSCULAR | Status: AC
  Administered 2015-09-03: 10 mg via INTRAMUSCULAR
  Filled 2015-09-03: qty 2

## 2015-09-03 MED ORDER — DIPHENHYDRAMINE HCL 50 MG/ML IJ SOLN
25.0000 mg | Freq: Once | INTRAMUSCULAR | Status: AC
  Administered 2015-09-03: 25 mg via INTRAMUSCULAR
  Filled 2015-09-03: qty 1

## 2015-09-03 MED ORDER — DEXAMETHASONE SODIUM PHOSPHATE 4 MG/ML IJ SOLN
10.0000 mg | Freq: Once | INTRAMUSCULAR | Status: AC
  Administered 2015-09-03: 10 mg via INTRAMUSCULAR
  Filled 2015-09-03: qty 3

## 2015-09-03 NOTE — ED Notes (Addendum)
PT c/o migraine starting this morning unrelieved by 800mg  ibuprofen. PT also states anemia and increased weakness from resent ED visit.

## 2015-09-03 NOTE — ED Provider Notes (Signed)
CSN: [id number]     Arrival date & time 09/03/15  22 History   First MD Initiated Contact with Patient 09/03/15 [phone]     Chief Complaint  Patient presents with  . Migraine  . Fatigue      HPI Pt was seen at 1615. Per pt, c/o gradual onset and persistence of constant acute flair of her chronic migraine headache since this morning.  Describes the headache as per her usual chronic migraine headache pain pattern for many years. States she took ibuprofen 800mg  today without relief.  Denies headache was sudden or maximal in onset or at any time.  Denies visual changes, no focal motor weakness, no tingling/numbness in extremities, no fevers, no neck pain, no rash. The patient has a significant history of similar symptoms previously, recently being evaluated for this complaint and multiple prior evals for same. Pt also c/o generalized fatigue for the past 1 week. Pt states she was evaluated by the Health Dept this past week and was told her "Hgb was low." Pt was then seen in the ED 2 days ago, with her H/H per baseline. Pt states she continues "tired" and concerned regarding her Hgb level and "if I need a transfusion." Denies CP/palpitations, no SOB/cough, no abd pain, no N/V/D, no black or blood in stools.     Past Medical History  Diagnosis Date  . Obesity     gastric Bypass, Roux-en Y  . Migraines   . Frozen shoulder syndrome   . Depression   . Chronic knee pain   . Chronic left shoulder pain   . Chronic neck pain   . Patellofemoral syndrome, left   . Anxiety   . Chronic anemia    Past Surgical History  Procedure Laterality Date  . Gastric bypass  04/20/2011    [place], wt 271lb preop  . Fertility surgery  2009    No abnormalities in female   Family History  Problem Relation Age of Onset  . Diabetes Father   . Hyperlipidemia Father   . Hypertension Father   . Cancer Father     skin cancer  . Depression Mother     lifelong  . Anesthesia problems Neg Hx   . Arthritis      Social History  Substance Use Topics  . Smoking status: Never Smoker   . Smokeless tobacco: Never Used  . Alcohol Use: No   OB History    Gravida Para Term Preterm AB TAB SAB Ectopic Multiple Living   3 3 3  0 0 0 0 0 0 3     Review of Systems ROS: Statement: All systems negative except as marked or noted in the HPI; Constitutional: Negative for fever and chills. +fatigue.; ; Eyes: Negative for eye pain, redness and discharge. ; ; ENMT: Negative for ear pain, hoarseness, nasal congestion, sinus pressure and sore throat. ; ; Cardiovascular: Negative for chest pain, palpitations, diaphoresis, dyspnea and peripheral edema. ; ; Respiratory: Negative for cough, wheezing and stridor. ; ; Gastrointestinal: Negative for nausea, vomiting, diarrhea, abdominal pain, blood in stool, hematemesis, jaundice and rectal bleeding. . ; ; Genitourinary: Negative for dysuria, flank pain and hematuria. ; ; Musculoskeletal: Negative for back pain and neck pain. Negative for swelling and trauma.; ; Skin: Negative for pruritus, rash, abrasions, blisters, bruising and skin lesion.; ; Neuro: +headache. Negative for lightheadedness and neck stiffness. Negative for weakness, altered level of consciousness , altered mental status, extremity weakness, paresthesias, involuntary movement, seizure and syncope.  Allergies  Morphine and related; Nsaids; Penicillins; and Tramadol  Home Medications   Prior to Admission medications   Medication Sig Start Date End Date Taking? Authorizing Provider  acetaminophen (TYLENOL) 325 MG tablet Take 2 tablets (650 mg total) by mouth every 6 (six) hours as needed for moderate pain (pain). 06/25/15   Domenic Moras, PA-C   BP 101/78 mmHg  Pulse 66  Temp(Src) 98.1 F (36.7 C) (Oral)  Resp 18  Ht 5\' 5"  (1.651 m)  Wt 180 lb (81.647 kg)  BMI 29.95 kg/m2  SpO2 100%  LMP 08/22/2015   16:27 Orthostatic Vital Signs Orthostatic Lying  - BP- Lying: 110/79 mmHg ; Pulse- Lying: 67   Orthostatic Sitting - BP- Sitting: 118/75 mmHg ; Pulse- Sitting: 69  Orthostatic Standing at 0 minutes - BP- Standing at 0 minutes: 115/72 mmHg ; Pulse- Standing at 0 minutes: 75     Physical Exam  1620: Physical examination:  Nursing notes reviewed; Vital signs and O2 SAT reviewed;  Constitutional: Well developed, Well nourished, Well hydrated, In no acute distress; Head:  Normocephalic, atraumatic; Eyes: EOMI, PERRL, No scleral icterus; ENMT: TM's clear bilat. Mouth and pharynx normal, Mucous membranes moist; Neck: Supple, Full range of motion, No lymphadenopathy; Cardiovascular: Regular rate and rhythm, No murmur, rub, or gallop; Respiratory: Breath sounds clear & equal bilaterally, No rales, rhonchi, wheezes.  Speaking full sentences with ease, Normal respiratory effort/excursion; Chest: Nontender, Movement normal; Abdomen: Soft, Nontender, Nondistended, Normal bowel sounds; Genitourinary: No CVA tenderness; Extremities: Pulses normal, No tenderness, No edema, No calf edema or asymmetry.; Neuro: AA&Ox3, Major CN grossly intact. No facial droop. Speech clear. No gross focal motor or sensory deficits in extremities. Climbs on and off stretcher easily by herself. Gait steady.; Skin: Color normal, Warm, Dry.   ED Course  Procedures (including critical care time) Labs Review   Imaging Review  I have personally reviewed and evaluated these images and lab results as part of my medical decision-making.   EKG Interpretation None      MDM  MDM Reviewed: previous chart, nursing note and vitals Reviewed previous: labs Interpretation: labs     Results for orders placed or performed during the hospital encounter of 09/03/15  Pregnancy, urine  Result Value Ref Range   Preg Test, Ur NEGATIVE NEGATIVE  Urinalysis, Routine w reflex microscopic  Result Value Ref Range   Color, Urine STRAW (A) YELLOW   APPearance CLEAR CLEAR   Specific Gravity, Urine 1.015 1.005 - 1.030   pH 5.0 5.0 - 8.0    Glucose, UA NEGATIVE NEGATIVE mg/dL   Hgb urine dipstick NEGATIVE NEGATIVE   Bilirubin Urine NEGATIVE NEGATIVE   Ketones, ur NEGATIVE NEGATIVE mg/dL   Protein, ur NEGATIVE NEGATIVE mg/dL   Urobilinogen, UA 0.2 0.0 - 1.0 mg/dL   Nitrite NEGATIVE NEGATIVE   Leukocytes, UA NEGATIVE NEGATIVE  Basic metabolic panel  Result Value Ref Range   Sodium 137 135 - 145 mmol/L   Potassium 3.9 3.5 - 5.1 mmol/L   Chloride 106 101 - 111 mmol/L   CO2 27 22 - 32 mmol/L   Glucose, Bld 86 65 - 99 mg/dL   BUN 11 6 - 20 mg/dL   Creatinine, Ser 0.62 0.44 - 1.00 mg/dL   Calcium 8.4 (L) 8.9 - 10.3 mg/dL   GFR calc non Af Amer >60 >60 mL/min   GFR calc Af Amer >60 >60 mL/min   Anion gap 4 (L) 5 - 15  CBC with Differential  Result Value Ref  Range   WBC 6.3 4.0 - 10.5 K/uL   RBC 3.93 3.87 - 5.11 MIL/uL   Hemoglobin 7.8 (L) 12.0 - 15.0 g/dL   HCT 26.7 (L) 36.0 - 46.0 %   MCV 67.9 (L) 78.0 - 100.0 fL   MCH 19.8 (L) 26.0 - 34.0 pg   MCHC 29.2 (L) 30.0 - 36.0 g/dL   RDW 16.3 (H) 11.5 - 15.5 %   Platelets 367 150 - 400 K/uL   Neutrophils Relative % 60 43 - 77 %   Neutro Abs 3.8 1.7 - 7.7 K/uL   Lymphocytes Relative 29 12 - 46 %   Lymphs Abs 1.8 0.7 - 4.0 K/uL   Monocytes Relative 6 3 - 12 %   Monocytes Absolute 0.4 0.1 - 1.0 K/uL   Eosinophils Relative 5 0 - 5 %   Eosinophils Absolute 0.3 0.0 - 0.7 K/uL   Basophils Relative 1 0 - 1 %   Basophils Absolute 0.0 0.0 - 0.1 K/uL    1810:  Improved after meds and wants to go home now. Not orthostatic on VS. H/H per baseline. Encouraged to f/u with Heme/Onc MD as per her previous ED visit 2 days ago; pt verb understanding. Dx and testing d/w pt.   Questions answered.  Verb understanding, agreeable to d/c home with outpt f/u.      Francine Graven, DO 09/06/15 1751

## 2015-09-03 NOTE — Discharge Instructions (Signed)
°Emergency Department Resource Guide °1) Find a Doctor and Pay Out of Pocket °Although you won't have to find out who is covered by your insurance plan, it is a good idea to ask around and get recommendations. You will then need to call the office and see if the doctor you have chosen will accept you as a new patient and what types of options they offer for patients who are self-pay. Some doctors offer discounts or will set up payment plans for their patients who do not have insurance, but you will need to ask so you aren't surprised when you get to your appointment. ° °2) Contact Your Local Health Department °Not all health departments have doctors that can see patients for sick visits, but many do, so it is worth a call to see if yours does. If you don't know where your local health department is, you can check in your phone book. The CDC also has a tool to help you locate your state's health department, and many state websites also have listings of all of their local health departments. ° °3) Find a Walk-in Clinic °If your illness is not likely to be very severe or complicated, you may want to try a walk in clinic. These are popping up all over the country in pharmacies, drugstores, and shopping centers. They're usually staffed by nurse practitioners or physician assistants that have been trained to treat common illnesses and complaints. They're usually fairly quick and inexpensive. However, if you have serious medical issues or chronic medical problems, these are probably not your best option. ° °No Primary Care Doctor: °- Call [facility] at  832-8000 - they can help you locate a primary care doctor that  accepts your insurance, provides certain services, etc. °- Physician Referral Service- 1-800-533-3463 ° °Chronic Pain Problems: °Organization         Address  Phone   Notes  °[facility]  ([phone]) [phone] Patients need to be referred by their primary care doctor.  ° °Medication  Assistance: °Organization         Address  Phone   Notes  °[place] [facility] [address] °[place], [place] [address] (336) 641-8030 --Must be a resident of [place] °-- Must have NO insurance coverage whatsoever (no Medicaid/ Medicare, etc.) °-- The pt. MUST have a primary care doctor that directs their care regularly and follows them in the community °  °[facility]  (866) 331-1348   °[employer]  (888) 892-1162   ° °Agencies that provide inexpensive medical care: °Organization         Address  Phone   Notes  °[facility]  (336) 832-8035   °[facility]    (336) 832-7272   °[facility] [address] °[place], [place] [address] (336) 832-4777   °[facility] [address], °[place] (336) 271-4999   °[facility]    (336) 373-0678   °[facility]    (336) 272-1050   °[facility] ° [address], [place] Phone:  (336) 832-4444, Fax:  (336) 832-4440 Hours of Operation:  9 am - 6 pm, M-F.  Also accepts Medicaid/Medicare and self-pay.  °[facility] ° [address],  Phone: (336) [phone], Fax: (336) [phone]. Hours of Operation:  8:30 am - 5:30 pm, M-F.  Also accepts Medicaid and self-pay.  °[facility] [place] [address], [place] Phone: (336) 878-6027   °[facility] [address], [place], [place] (336)723-1848, Ext. 123 Mondays & Thursdays: 7-9 AM.  First 15 patients are seen on a first come, first serve basis. °  ° °Medicaid-accepting [place] Providers: ° °Organization         Address  Phone   Notes  °[facility] [address], [place] (336) 641-2100 Also accepts self-pay patients.  °[facility] [address], [place] ° (336) [phone]   °[facility] [address], [place]  (336) 288-8857   °[facility] [address] [place] [address], [place] (336) 299-7000   °Veita Bland [address], [place]  ° (336) 373-1557 Only accepts [place] Access Medicaid patients after they have their name applied to their card.  ° °Self-Pay (no insurance) in [place]: ° °Organization         Address  Phone   Notes  °Sickle Cell Patients, [facility] [address], [place] (336) 832-1970   °[facility] [address], [place] (336) 832-4400   °[facility] ° [address], [place] (336) 992-4800   °[facility]/Dr. Osei-Bonsu ° [address] [place] [address], [place] or [address], [place] (336) [phone] Phone number for both [place] and [place] locations is the same.  °[facility] [address], [place] (336) 299-0000   °[facility] [address] [place] [address], [place] or [address] (336) 852-7530 °(336) 878-2260   °[facility] [address], [place] (336) 350-1642, phone; (336) 294-5005, fax Sees patients 1st and 3rd Saturday of every month.  Must not qualify for public or private insurance (i.e. Medicaid, Medicare, [place] Health Choice, Veterans' Benefits) • Household income should be no more than 200% of the poverty level •The clinic cannot treat you if you are pregnant or think you are pregnant • Sexually transmitted diseases are not treated at the clinic.  ° ° °Dental Care: °Organization         Address  Phone  Notes  °[place] [facility] [address], [place] (336) 641-6152 Accepts children up to age 21 who are enrolled in Medicaid or [place] Health Choice; pregnant women with a Medicaid card; and children who have applied for Medicaid or [place] Health Choice, but were declined, whose parents can pay a reduced fee at time of service.  °[place]  [facility] [place]  [address], [place] (336) 641-7733 Accepts children up to age 21 who are enrolled in Medicaid or [place] Health Choice; pregnant women with a Medicaid card; and children who have applied for Medicaid or [place] Health Choice, but were declined, whose parents can pay a reduced fee at time of service.  °[facility] ° [address],  (336) 641-4533 Patients are seen by appointment only. Walk-ins are not accepted. [facility] will see patients 18 years of age and older. °Monday - Tuesday (8am-5pm) °Most Wednesdays (8:30-5pm) °$30 per visit, cash only  °[facility] ° [address], [place] (336) [phone] Patients are seen by appointment only. Walk-ins are not accepted. [facility] will see patients 18 years of age and older. °One   Wednesday Evening (Monthly: Volunteer Based).  $30 per visit, cash only  °[facility]  (919) [phone] for adults; Children under age 4, call Graduate Pediatric Dentistry at ([phone]) [phone]. Children aged 4-14, please call ([phone]) [phone] to request a pediatric application. ° Dental services are provided in all areas of dental care including fillings, crowns and bridges, complete and partial dentures, implants, gum treatment, root canals, and extractions. Preventive care is also provided. Treatment is provided to both adults and children. °Patients are selected via a lottery and there is often a waiting list. °  °[facility] [address], °[place] ° (336) 763-8833 [email or web address] °  °[facility] [address], [place], [place] (336)723-1848, Ext. 123 Second and Fourth Thursday of each month, opens at 6:30 AM; Clinic ends at 9 AM.  Patients are seen on a first-come first-served basis, and a limited number are seen during each clinic.  ° °[facility] ° [address], [place], [place] (336) [phone]    Eligibility Requirements °You must have lived in [place], [place], or [place] counties for at least the last three months. °  You cannot be eligible for state or federal sponsored healthcare insurance, including Veterans Administration, Medicaid, or Medicare. °  You generally cannot be eligible for healthcare insurance through your employer.  °  How to apply: °Eligibility screenings are held every Tuesday and Wednesday afternoon from 1:00 pm until 4:00 pm. You do not need an appointment for the interview!  °[facility] [address], [place], [place] 336-631-2330   °[facility]  336-342-8273   °[place] [facility]  336-703-3100   °[facility]  336-570-6415   ° °Behavioral Health Resources in the Community: °Intensive Outpatient Programs °Organization         Address  Phone  Notes  °[facility] [address], [place], [place] 336-878-6098   °[facility] [address], [place], [place] 336-832-9800   °[facility] [address], [place], [place] ° 336-882-2125   °[facility] [address],  °[place], [place] 1-800-853-5163 or 336-641-4981   °Substance Abuse Resources °Organization         Address  Phone  Notes  °[facility]  336-882-2125   °[facility]  336-784-9470   °[facility]  336-285-9073   °[facility]  336-845-3988   °Residential & Outpatient Substance Abuse Program  1-800-659-3381   °Psychological Services °Organization         Address  Phone  Notes  ° Health  336- 832-9600   °[facility]  336- 378-7881   °[facility] [address], [place] 1-800-853-5163 or 336-641-4981   ° °Mobile Crisis Teams °Organization         Address  Phone  Notes  °[facility], Mobile Crisis Care Unit  1-877-626-1772   °Assertive °[facility] ° [address]  [place], [place] 336-834-9664   °Sharon DeEsch [address] °[place] 336-554-5454   ° °Self-Help/Support Groups °Organization         Address  Phone             Notes  °[facility] [place] - variety of support groups  336- 373-1402 Call for more information  °Narcotics Anonymous (NA), [facility] [address], °[place]  2 meetings at this location  ° °  Residential Treatment Programs Organization         Address  Phone  Notes  [facility] [address],    [place]  1-3401747628   [facility]  [address], [place] [address], [place], [place]   [facility] [facility] [place], [place] 337-196-6845 Admissions: 8am-3pm M-F  [facility] [address]    [place], [place] 779-390-3009   [facility] [address] [place], [place], [place]   [facility] [address],  [place], [place]   [facility] Intensive Outpatient [address], [place], [place]   [facility] [place]  [place], [place] 1-479-209-4657 or (680)311-5042   [facility] [address], [place], [place] Accepts Medicaid  [facility] [address],  [place] 1-504-508-7750 Substance Abuse/Addiction Treatment   [facility]         Address  Phone  Notes  [facility]  (816)347-1471   Domenic Schwab, PhD [address] [place], [place]   (719)521-8555 or [phone]   [facility] [place], [place] 2315686636   [facility] [address] [place], [place] 567-720-5461 Insurance/Medicaid/sponsorship through [facility] [address]                                    [place], [place] [phone] [place] [address], [place] ([phone]) [phone]    Dr. Adele Schilder  (973) [phone]   [facility] Dept. 1) [address] [place] 2) [place] 3)  [address], [place] [phone]  857-214-8175   [place] 8286967995 or [phone] (After Hours)      Take over the counter tylenol, ibuprofen and benadryl, as directed on packaging, with the prescription given to you today, as needed for headache.  Keep a headache diary.  Call your regular medical doctor, the Neurologist and the Hematologist on Monday to schedule a follow up appointment this week.  Return to the Emergency Department immediately sooner if worsening.

## 2015-09-12 ENCOUNTER — Encounter (HOSPITAL_BASED_OUTPATIENT_CLINIC_OR_DEPARTMENT_OTHER): Payer: Self-pay

## 2015-09-12 ENCOUNTER — Encounter (HOSPITAL_COMMUNITY): Payer: Self-pay | Attending: Oncology | Admitting: Oncology

## 2015-09-12 ENCOUNTER — Encounter (HOSPITAL_COMMUNITY): Payer: Self-pay | Admitting: Oncology

## 2015-09-12 VITALS — BP 109/72 | HR 79 | Temp 98.5°F | Resp 16 | Ht 65.0 in | Wt 187.5 lb

## 2015-09-12 VITALS — BP 107/57 | HR 75 | Temp 97.9°F | Resp 18

## 2015-09-12 DIAGNOSIS — D509 Iron deficiency anemia, unspecified: Secondary | ICD-10-CM | POA: Insufficient documentation

## 2015-09-12 DIAGNOSIS — Z9884 Bariatric surgery status: Secondary | ICD-10-CM | POA: Insufficient documentation

## 2015-09-12 HISTORY — DX: Iron deficiency anemia, unspecified: D50.9

## 2015-09-12 HISTORY — DX: Bariatric surgery status: Z98.84

## 2015-09-12 LAB — FOLATE: Folate: 14.7 ng/mL (ref >5.9)

## 2015-09-12 LAB — CBC WITH DIFFERENTIAL/PLATELET
BASOS PCT: 1 % (ref 0–1)
Basophils Absolute: 0.1 10*3/uL (ref 0.0–0.1)
EOS ABS: 0.2 10*3/uL (ref 0.0–0.7)
Eosinophils Relative: 4 % (ref 0–5)
HCT: 28 % — ABNORMAL LOW (ref 36.0–46.0)
HEMOGLOBIN: 8.3 g/dL — AB (ref 12.0–15.0)
LYMPHS ABS: 2.1 10*3/uL (ref 0.7–4.0)
Lymphocytes Relative: 31 % (ref 12–46)
MCH: 20.1 pg — ABNORMAL LOW (ref 26.0–34.0)
MCHC: 29.6 g/dL — ABNORMAL LOW (ref 30.0–36.0)
MCV: 67.8 fL — ABNORMAL LOW (ref 78.0–100.0)
Monocytes Absolute: 0.5 10*3/uL (ref 0.1–1.0)
Monocytes Relative: 7 % (ref 3–12)
NEUTROS ABS: 3.9 10*3/uL (ref 1.7–7.7)
NEUTROS PCT: 58 % (ref 43–77)
PLATELETS: 295 10*3/uL (ref 150–400)
RBC: 4.13 MIL/uL (ref 3.87–5.11)
RDW: 16.7 % — ABNORMAL HIGH (ref 11.5–15.5)
WBC: 6.7 10*3/uL (ref 4.0–10.5)

## 2015-09-12 LAB — RETICULOCYTES
RBC.: 4.13 MIL/uL (ref 3.87–5.11)
Retic Count, Absolute: 66.1 10*3/uL (ref 19.0–186.0)
Retic Ct Pct: 1.6 % (ref 0.4–3.1)

## 2015-09-12 LAB — IRON AND TIBC
IRON: 83 ug/dL (ref 28–170)
Saturation Ratios: 15 % (ref 10.4–31.8)
TIBC: 545 ug/dL — AB (ref 250–450)
UIBC: 462 ug/dL

## 2015-09-12 LAB — VITAMIN B12: VITAMIN B 12: 158 pg/mL — AB (ref 180–914)

## 2015-09-12 LAB — FERRITIN: Ferritin: 4 ng/mL — ABNORMAL LOW (ref 11–307)

## 2015-09-12 MED ORDER — SODIUM CHLORIDE 0.9 % IJ SOLN
10.0000 mL | Freq: Once | INTRAMUSCULAR | Status: AC
  Administered 2015-09-12: 10 mL via INTRAVENOUS

## 2015-09-12 MED ORDER — SODIUM CHLORIDE 0.9 % IV SOLN
INTRAVENOUS | Status: DC
  Administered 2015-09-12: 13:00:00 via INTRAVENOUS

## 2015-09-12 MED ORDER — SODIUM CHLORIDE 0.9 % IV SOLN
510.0000 mg | Freq: Once | INTRAVENOUS | Status: AC
  Administered 2015-09-12: 510 mg via INTRAVENOUS
  Filled 2015-09-12: qty 17

## 2015-09-12 NOTE — Patient Instructions (Signed)
[place] [facility] Discharge Instructions  RECOMMENDATIONS MADE BY THE CONSULTANT AND ANY TEST RESULTS WILL BE SENT TO YOUR REFERRING PHYSICIAN.  Lab work today. Feraheme infusion (1 of 2) today and 1 dose again next week. Lab work in 4-6 weeks. MD appointment to follow lab work. Return as scheduled.  Thank you for choosing [place] at [facility] to provide your oncology and hematology care.  To afford each patient quality time with our provider, please arrive at least 15 minutes before your scheduled appointment time.    You need to re-schedule your appointment should you arrive 10 or more minutes late.  We strive to give you quality time with our providers, and arriving late affects you and other patients whose appointments are after yours.  Also, if you no show three or more times for appointments you may be dismissed from the clinic at the providers discretion.     Again, thank you for choosing [facility].  Our hope is that these requests will decrease the amount of time that you wait before being seen by our physicians.       _____________________________________________________________  Should you have questions after your visit to [facility], please contact our office at [phone] between the hours of 8:30 a.m. and 4:30 p.m.  Voicemails left after 4:30 p.m. will not be returned until the following business day.  For prescription refill requests, have your pharmacy contact our office.

## 2015-09-12 NOTE — Assessment & Plan Note (Addendum)
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  Suspect iron deficiency anemia.  Labs today: CBC diff, anemia panel, Retic count, copper level, Vit D level.  Based upon her labs from 9/3, she has about 1000 mg iron deficit.  We will get her set-up for IV Ferhame 510 mg x 2 doses.  She has an upcoming trip out of town in about 10-14 days.  We will work around her schedule.  Labs in 4-6 weeks: CBC diff, iron/TIBC, ferritin.  She can stop her PO iron replacement due to her likely malabsorption.  Return in 4-6 weeks for follow-up.

## 2015-09-12 NOTE — Progress Notes (Signed)
1410:  Tolerated infusion w/o adverse reaction.  A&Ox4; in no distress.  VSS.  Discharged ambulatory.  See office visit encounter for assessment and further information.

## 2015-09-12 NOTE — Progress Notes (Signed)
[facility] Hematology/Oncology Consultation   Name: [name]      MRN: [id number]    Date: 09/12/2015 Time:1:26 PM   REFERRING PHYSICIAN:  [facility] Department, [place], [place]  REASON FOR CONSULT:  Anemia  DIAGNOSIS:  Microcytic, hypochromic anemia  HISTORY OF PRESENT ILLNESS:   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  Chart reviewed  I personally reviewed and went over laboratory results with the patient.  The results are noted within this dictation.  I personally reviewed and went over radiographic studies with the patient.  The results are noted within this dictation.    She reports that she is S/P Roux en Y procedure in [place], [place].  She notes that she got down to 135 lbs, but then put weight back on because she looked like a "crack addict."  Overall, she is down about 70-80 lbs.  She reports 2 ED visit, one from fatigue and another one from an episode where she nearly passed out.    Her last menstrual cycle was on August 22.  She notes that it was normal for her.  She denies any changes in her menses.  She reports that her menses lasts 3 days. She does not describe it as heavy.   She denies any blood in her stool, black tarry stool, hematuria, hemoptysis.   She reports pica with cravings for sand, baking soda, and interestingly, pavement (without consumption of course).   She denies any colonoscopy in the past.  PAST MEDICAL HISTORY:   Past Medical History  Diagnosis Date  . Obesity     gastric Bypass, Roux-en Y  . Migraines   . Frozen shoulder syndrome   . Depression   . Chronic knee pain   . Chronic left shoulder pain   . Chronic neck pain   . Patellofemoral syndrome, left   . Anxiety   . Chronic anemia   . Microcytic hypochromic anemia  09/12/2015  . Gastric bypass status for obesity 09/12/2015    ALLERGIES: Allergies  Allergen Reactions  . Morphine And Related Other (See Comments)    Internal burning sensation post surgical prcedure  . Nsaids Other (See Comments)    Due to gastric bypass  . Penicillins Itching and Swelling  . Tramadol     Makes her restless      MEDICATIONS: I have reviewed the patient's current medications.    Current Outpatient Prescriptions on File Prior to Visit  Medication Sig Dispense Refill  . ibuprofen (ADVIL,MOTRIN) 200 MG tablet Take 800 mg by mouth every 6 (six) hours as needed for mild pain or moderate pain.    . promethazine (PHENERGAN) 25 MG tablet Take 1 tablet (25 mg total) by mouth every 6 (six) hours as needed for nausea or vomiting (or headache). (Patient not taking: Reported on 09/12/2015) 8 tablet 0   No current facility-administered medications on file prior to visit.     PAST SURGICAL HISTORY Past Surgical History  Procedure Laterality Date  . Gastric bypass  04/20/2011    [place], [place] 271lb preop  . Fertility surgery  2009    No abnormalities in female    FAMILY HISTORY: Family History  Problem Relation Age of Onset  . Diabetes Father   . Hyperlipidemia Father   .  Hypertension Father   . Cancer Father     skin cancer  . Depression Mother     lifelong  . Anesthesia problems Neg Hx   . Arthritis      SOCIAL HISTORY:  reports that she has never smoked. She has never used smokeless tobacco. She reports that she does not drink alcohol or use illicit drugs.  PERFORMANCE STATUS: The patient's performance status is 1 - Symptomatic but completely ambulatory  PHYSICAL EXAM: Most Recent Vital Signs: Blood pressure 109/72, pulse 79, temperature 98.5 F (36.9 C), temperature source Oral, resp. rate 16, height _0  (1.651 m), weight 187 lb 8 oz (85.049 kg), last menstrual period 08/22/2015, SpO2 100 %, currently breastfeeding. General appearance: alert, appears  stated age, no distress and moderately obese Head: Normocephalic, without obvious abnormality, atraumatic Eyes: negative findings: lids and lashes normal, conjunctivae and sclerae normal and corneas clear Throat: lips, mucosa, and tongue normal; teeth and gums normal Neck: no adenopathy, supple, symmetrical, trachea midline and thyroid not enlarged, symmetric, no tenderness/mass/nodules Lungs: clear to auscultation bilaterally Heart: regular rate and rhythm, S1, S2 normal, no murmur, click, rub or gallop Extremities: extremities normal, atraumatic, no cyanosis or edema Pulses: 2+ and symmetric Skin: Skin color, texture, turgor normal. No rashes or lesions Lymph nodes: Cervical, supraclavicular, and axillary nodes normal. Neurologic: Alert and oriented X 3, normal strength and tone. Normal symmetric reflexes. Normal coordination and gait  LABORATORY DATA:  No results found for this or any previous visit (from the past 48 hour(s)).    Results for IOWA, KAPPES (MRN [id number]) as of 09/30/2015 20:02  Ref. Range 09/01/2015 22:06 09/02/2015 15:37 09/03/2015 16:16 09/03/2015 16:44 09/12/2015 13:18  Sodium Latest Ref Range: 135-145 mmol/L 142   137   Potassium Latest Ref Range: 3.5-5.1 mmol/L 3.6   3.9   Chloride Latest Ref Range: 101-111 mmol/L 104   106   CO2 Latest Ref Range: 22-32 mmol/L    27   BUN Latest Ref Range: 6-20 mg/dL 11   11   Creatinine Latest Ref Range: 0.44-1.00 mg/dL 0.60   0.62   Calcium Latest Ref Range: 8.9-10.3 mg/dL    8.4 (L)   EGFR (Non-African Amer.) Latest Ref Range: >60 mL/min    >60   EGFR (African American) Latest Ref Range: >60 mL/min    >60   Glucose Latest Ref Range: 65-99 mg/dL 78   86   Anion gap Latest Ref Range: 5-15     4 (L)   Calcium Ionized Latest Ref Range: 1.12-1.23 mmol/L 1.15      Iron Latest Ref Range: 28-170 ug/dL     83  UIBC Latest Units: ug/dL     462  TIBC Latest Ref Range: 250-450 ug/dL     545 (H)  Saturation Ratios Latest Ref Range:  10.4-31.8 %     15  Ferritin Latest Ref Range: 11-307 ng/mL     4 (L)  Copper Latest Ref Range: 72-166 ug/dL     137  Vit D, 25-Hydroxy Latest Ref Range: 30.0-100.0 ng/mL     20.2 (L)  Vitamin B-12 Latest Ref Range: 180-914 pg/mL     158 (L)  WBC Latest Ref Range: 4.0-10.5 K/uL    6.3 6.7  RBC Latest Ref Range: 3.87-5.11 MIL/uL    3.93 4.13  Hemoglobin Latest Ref Range: 12.0-15.0 g/dL 7.8 (L)   7.8 (L) 8.3 (L)  HCT Latest Ref Range: 36.0-46.0 % 23.0 (L)   26.7 (L) 28.0 (L)  MCV  Latest Ref Range: 78.0-100.0 fL    67.9 (L) 67.8 (L)  MCH Latest Ref Range: 26.0-34.0 pg    19.8 (L) 20.1 (L)  MCHC Latest Ref Range: 30.0-36.0 g/dL    29.2 (L) 29.6 (L)  RDW Latest Ref Range: 11.5-15.5 %    16.3 (H) 16.7 (H)  Platelets Latest Ref Range: 150-400 K/uL    367 295  Neutrophils Latest Ref Range: 43-77 %    60 58  Lymphocytes Latest Ref Range: 12-46 %    29 31  Monocytes Relative Latest Ref Range: 3-12 %    6 7  Eosinophil Latest Ref Range: 0-5 %    5 4  Basophil Latest Ref Range: 0-1 %    1 1  NEUT# Latest Ref Range: 1.7-7.7 K/uL    3.8 3.9  Lymphocyte # Latest Ref Range: 0.7-4.0 K/uL    1.8 2.1  Monocyte # Latest Ref Range: 0.1-1.0 K/uL    0.4 0.5  Eosinophils Absolute Latest Ref Range: 0.0-0.7 K/uL    0.3 0.2  Basophils Absolute Latest Ref Range: 0.0-0.1 K/uL    0.0 0.1  RBC Morphology Unknown     ELLIPTOCYTES  RBC. Latest Ref Range: 3.87-5.11 MIL/uL     4.13  Retic Ct Pct Latest Ref Range: 0.4-3.1 %     1.6  Retic Count, Manual Latest Ref Range: 19.0-186.0 K/uL     66.1  Preg Test, Ur Latest Ref Range: NEGATIVE    NEGATIVE    Appearance Latest Ref Range: CLEAR    CLEAR    Bilirubin Urine Latest Ref Range: NEGATIVE    NEGATIVE    Color, Urine Latest Ref Range: YELLOW    STRAW (A)    Glucose Latest Ref Range: NEGATIVE mg/dL   NEGATIVE    Hgb urine dipstick Latest Ref Range: NEGATIVE    NEGATIVE    Ketones, ur Latest Ref Range: NEGATIVE mg/dL   NEGATIVE    Leukocytes, UA Latest Ref Range:  NEGATIVE    NEGATIVE    Nitrite Latest Ref Range: NEGATIVE    NEGATIVE    pH Latest Ref Range: 5.0-8.0    5.0    Protein Latest Ref Range: NEGATIVE mg/dL   NEGATIVE    Specific Gravity, Urine Latest Ref Range: 1.005-1.030    1.015    Urobilinogen, UA Latest Ref Range: 0.0-1.0 mg/dL   0.2     ASSESSMENT/PLAN:   Microcytic hypochromic anemia Microcytic, hypochromic anemia, with obvious iron deficiency (ferritin of 4 ng/ml and elevated TIBC) 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  Labs today: CBC diff, anemia panel, Retic count, copper level, Vit D level.  Based upon her labs from 9/3, she has about 1000 mg iron deficit.  We will get her set-up for IV Ferhame 510 mg x 2 doses.  She has an upcoming trip out of town in about 10-14 days.  We will work around her schedule.  Labs in 4-6 weeks: CBC diff, iron/TIBC, ferritin.  She can stop her PO iron replacement due to her likely malabsorption.  Return in 4-6 weeks for follow-up. Additional recommendations will be made depending upon her counts in response to her IV iron therapy at that time.  All questions were answered. The patient knows to call the clinic with any problems, questions or concerns. We can certainly see the patient much sooner if necessary.  Patient and plan discussed with Dr. Ancil Linsey and she is in agreement with the aforementioned.  This note is electronically signed by: Molli Hazard, MD  09/12/2015 1:26 PM

## 2015-09-12 NOTE — Progress Notes (Signed)
LABS DRAWN

## 2015-09-13 ENCOUNTER — Other Ambulatory Visit (HOSPITAL_COMMUNITY): Payer: Self-pay | Admitting: Oncology

## 2015-09-13 ENCOUNTER — Encounter (HOSPITAL_COMMUNITY): Payer: Self-pay | Admitting: Oncology

## 2015-09-13 DIAGNOSIS — E559 Vitamin D deficiency, unspecified: Secondary | ICD-10-CM

## 2015-09-13 DIAGNOSIS — E538 Deficiency of other specified B group vitamins: Secondary | ICD-10-CM | POA: Insufficient documentation

## 2015-09-13 HISTORY — DX: Deficiency of other specified B group vitamins: E53.8

## 2015-09-13 LAB — COPPER, SERUM: COPPER: 137 ug/dL (ref 72–166)

## 2015-09-13 LAB — VITAMIN D 25 HYDROXY (VIT D DEFICIENCY, FRACTURES): VIT D 25 HYDROXY: 20.2 ng/mL — AB (ref 30.0–100.0)

## 2015-09-13 MED ORDER — ERGOCALCIFEROL 1.25 MG (50000 UT) PO CAPS: 50000.0000 [IU] | ORAL_CAPSULE | ORAL | Status: DC

## 2015-09-20 ENCOUNTER — Encounter (HOSPITAL_COMMUNITY): Payer: Self-pay

## 2015-09-20 ENCOUNTER — Encounter (HOSPITAL_BASED_OUTPATIENT_CLINIC_OR_DEPARTMENT_OTHER): Payer: Self-pay

## 2015-09-20 VITALS — BP 96/60 | HR 64 | Temp 98.2°F | Resp 16

## 2015-09-20 DIAGNOSIS — E538 Deficiency of other specified B group vitamins: Secondary | ICD-10-CM

## 2015-09-20 DIAGNOSIS — Z9884 Bariatric surgery status: Secondary | ICD-10-CM

## 2015-09-20 DIAGNOSIS — D509 Iron deficiency anemia, unspecified: Secondary | ICD-10-CM

## 2015-09-20 MED ORDER — SODIUM CHLORIDE 0.9 % IV SOLN
INTRAVENOUS | Status: DC
  Administered 2015-09-20: 14:00:00 via INTRAVENOUS

## 2015-09-20 MED ORDER — CYANOCOBALAMIN 1000 MCG/ML IJ SOLN
INTRAMUSCULAR | Status: AC
  Filled 2015-09-20: qty 1

## 2015-09-20 MED ORDER — SODIUM CHLORIDE 0.9 % IJ SOLN
10.0000 mL | Freq: Once | INTRAMUSCULAR | Status: AC
  Administered 2015-09-20: 10 mL via INTRAVENOUS

## 2015-09-20 MED ORDER — CYANOCOBALAMIN 1000 MCG/ML IJ SOLN
1000.0000 ug | Freq: Once | INTRAMUSCULAR | Status: AC
  Administered 2015-09-20: 1000 ug via INTRAMUSCULAR

## 2015-09-20 MED ORDER — SODIUM CHLORIDE 0.9 % IV SOLN
510.0000 mg | Freq: Once | INTRAVENOUS | Status: AC
  Administered 2015-09-20: 510 mg via INTRAVENOUS
  Filled 2015-09-20: qty 17

## 2015-09-20 NOTE — Progress Notes (Signed)
[name] presents today for injection per MD orders. B12 1000 mcg administered IM in left Upper Arm. Administration without incident. Patient tolerated well. Tolerated iron infusion well.

## 2015-09-20 NOTE — Patient Instructions (Addendum)
[place] at [facility] Discharge Instructions  RECOMMENDATIONS MADE BY THE CONSULTANT AND ANY TEST RESULTS WILL BE SENT TO YOUR REFERRING PHYSICIAN.  Today you received Feraheme 510 mg iron infusion as ordered. You also received Vitamin B12 injection 1000 mcg week 1 of 4 as ordered. Per Gershon Mussel you are ok to take the Bariatric Advantage Iron and Multivitamin pills, 2 of each daily. Return as scheduled.  Thank you for choosing [place] at [facility] to provide your oncology and hematology care.  To afford each patient quality time with our provider, please arrive at least 15 minutes before your scheduled appointment time.    You need to re-schedule your appointment should you arrive 10 or more minutes late.  We strive to give you quality time with our providers, and arriving late affects you and other patients whose appointments are after yours.  Also, if you no show three or more times for appointments you may be dismissed from the clinic at the providers discretion.     Again, thank you for choosing [facility].  Our hope is that these requests will decrease the amount of time that you wait before being seen by our physicians.       _____________________________________________________________  Should you have questions after your visit to [facility], please contact our office at [phone] between the hours of 8:30 a.m. and 4:30 p.m.  Voicemails left after 4:30 p.m. will not be returned until the following business day.  For prescription refill requests, have your pharmacy contact our office.

## 2015-09-21 ENCOUNTER — Emergency Department (HOSPITAL_COMMUNITY)
Admission: EM | Admit: 2015-09-21 | Discharge: 2015-09-21 | Discharge status: Against Medical Advice | Payer: Self-pay | Attending: Emergency Medicine | Admitting: Emergency Medicine

## 2015-09-21 ENCOUNTER — Encounter (HOSPITAL_COMMUNITY): Payer: Self-pay | Admitting: *Deleted

## 2015-09-21 DIAGNOSIS — E669 Obesity, unspecified: Secondary | ICD-10-CM | POA: Insufficient documentation

## 2015-09-21 DIAGNOSIS — Z79899 Other long term (current) drug therapy: Secondary | ICD-10-CM | POA: Insufficient documentation

## 2015-09-21 DIAGNOSIS — R531 Weakness: Secondary | ICD-10-CM | POA: Insufficient documentation

## 2015-09-21 DIAGNOSIS — Z880 Allergy status to penicillin: Secondary | ICD-10-CM | POA: Insufficient documentation

## 2015-09-21 DIAGNOSIS — Z8679 Personal history of other diseases of the circulatory system: Secondary | ICD-10-CM | POA: Insufficient documentation

## 2015-09-21 DIAGNOSIS — Z8659 Personal history of other mental and behavioral disorders: Secondary | ICD-10-CM | POA: Insufficient documentation

## 2015-09-21 DIAGNOSIS — G8929 Other chronic pain: Secondary | ICD-10-CM | POA: Insufficient documentation

## 2015-09-21 DIAGNOSIS — Z9884 Bariatric surgery status: Secondary | ICD-10-CM | POA: Insufficient documentation

## 2015-09-21 DIAGNOSIS — Z8739 Personal history of other diseases of the musculoskeletal system and connective tissue: Secondary | ICD-10-CM | POA: Insufficient documentation

## 2015-09-21 DIAGNOSIS — D649 Anemia, unspecified: Secondary | ICD-10-CM | POA: Insufficient documentation

## 2015-09-21 DIAGNOSIS — Z862 Personal history of diseases of the blood and blood-forming organs and certain disorders involving the immune mechanism: Secondary | ICD-10-CM

## 2015-09-21 NOTE — ED Notes (Signed)
Pt is c/o of hurting all over; pt states she received an iron infusion yesterday and today she woke up with pain all over her body

## 2015-09-21 NOTE — ED Provider Notes (Signed)
CSN: [id number]     Arrival date & time 09/21/15  1904 History   First MD Initiated Contact with Patient 09/21/15 1936     Chief Complaint  Patient presents with  . Generalized Body Aches     (Consider location/radiation/quality/duration/timing/severity/associated sxs/prior Treatment) HPI Comments: Patient is a 39 year old female with history of anemia. She is currently receiving iron infusions, most recently yesterday. She states that since this infusion she has felt weak and fatigued and is complaining of discomfort in her right flank. She denies any difficulty breathing. She denies any cough. She denies any fevers or chills.  Patient is a 39 y.o. female presenting with weakness. The history is provided by the patient.  Weakness This is a new problem. The current episode started yesterday. The problem occurs constantly. The problem has been gradually worsening. Pertinent negatives include no chest pain, no abdominal pain, no headaches and no shortness of breath. Nothing aggravates the symptoms. Nothing relieves the symptoms. She has tried nothing for the symptoms. The treatment provided no relief.    Past Medical History  Diagnosis Date  . Obesity     gastric Bypass, Roux-en Y  . Migraines   . Frozen shoulder syndrome   . Depression   . Chronic knee pain   . Chronic left shoulder pain   . Chronic neck pain   . Patellofemoral syndrome, left   . Anxiety   . Chronic anemia   . Microcytic hypochromic anemia 09/12/2015  . Gastric bypass status for obesity 09/12/2015  . Vitamin B 12 deficiency 09/13/2015   Past Surgical History  Procedure Laterality Date  . Gastric bypass  04/20/2011    [place], wt 271lb preop  . Fertility surgery  2009    No abnormalities in female   Family History  Problem Relation Age of Onset  . Diabetes Father   . Hyperlipidemia Father   . Hypertension Father   . Cancer Father     skin cancer  . Depression Mother     lifelong  . Anesthesia  problems Neg Hx   . Arthritis     Social History  Substance Use Topics  . Smoking status: Never Smoker   . Smokeless tobacco: Never Used  . Alcohol Use: No   OB History    Gravida Para Term Preterm AB TAB SAB Ectopic Multiple Living   3 3 3  0 0 0 0 0 0 3     Review of Systems  Respiratory: Negative for shortness of breath.   Cardiovascular: Negative for chest pain.  Gastrointestinal: Negative for abdominal pain.  Neurological: Positive for weakness. Negative for headaches.  All other systems reviewed and are negative.     Allergies  Morphine and related; Nsaids; Penicillins; and Tramadol  Home Medications   Prior to Admission medications   Medication Sig Start Date End Date Taking? Authorizing Provider  ergocalciferol (VITAMIN D2) 50000 UNITS capsule Take 1 capsule (50,000 Units total) by mouth once a week. 09/13/15   Baird Cancer, PA-C  FERROUS FUMARATE PO Take 22.5 mg by mouth daily. Take 2 tablets daily    Historical Provider, MD  ibuprofen (ADVIL,MOTRIN) 200 MG tablet Take 800 mg by mouth every 6 (six) hours as needed for mild pain or moderate pain.    Historical Provider, MD  Multiple Vitamins-Minerals (MULTI COMPLETE/IRON PO) Take 2 tablets by mouth daily.     Historical Provider, MD  promethazine (PHENERGAN) 25 MG tablet Take 1 tablet (25 mg total) by mouth every 6 (  six) hours as needed for nausea or vomiting (or headache). Patient not taking: Reported on 09/12/2015 09/03/15   Francine Graven, DO   BP 99/55 mmHg  Pulse 87  Temp(Src) 98.2 F (36.8 C) (Oral)  Ht 5\' 9"  (1.753 m)  Wt 180 lb (81.647 kg)  BMI 26.57 kg/m2  SpO2 98%  LMP 08/22/2015 Physical Exam  Constitutional: She is oriented to person, place, and time. She appears well-developed and well-nourished. No distress.  HENT:  Head: Normocephalic and atraumatic.  Neck: Normal range of motion. Neck supple.  Cardiovascular: Normal rate and regular rhythm.  Exam reveals no gallop and no friction rub.   No  murmur heard. Pulmonary/Chest: Effort normal and breath sounds normal. No respiratory distress. She has no wheezes.  Abdominal: Soft. Bowel sounds are normal. She exhibits no distension. There is no tenderness.  Musculoskeletal: Normal range of motion.  Neurological: She is alert and oriented to person, place, and time.  Skin: Skin is warm and dry. She is not diaphoretic.  Nursing note and vitals reviewed.   ED Course  Procedures (including critical care time) Labs Review Labs Reviewed  CBC WITH DIFFERENTIAL/PLATELET  URINALYSIS, ROUTINE W REFLEX MICROSCOPIC (NOT AT [facility])  PREGNANCY, URINE    Imaging Review No results found. I have personally reviewed and evaluated these images and lab results as part of my medical decision-making.   EKG Interpretation None      MDM   Final diagnoses:  None    Patient presents here with the above complaints. My intentions were to initiate a workup to exclude an emergent cause including CBC, metabolic profile, and urinalysis. Shortly after I excused myself from the exam room and ordered these tests, I was informed by the nurse that patient was refusing and decided to go home. I understand she made the comments "I am not letting this place use me has a pin cushion anymore". She then signed out [place] and left the emergency department.    Veryl Speak, MD 09/21/15 2052

## 2015-09-27 ENCOUNTER — Ambulatory Visit (HOSPITAL_COMMUNITY): Payer: Self-pay

## 2015-09-30 ENCOUNTER — Encounter (HOSPITAL_COMMUNITY): Payer: Self-pay | Admitting: Oncology

## 2015-10-04 ENCOUNTER — Encounter (HOSPITAL_COMMUNITY): Payer: Self-pay | Attending: Hematology & Oncology

## 2015-10-04 VITALS — BP 103/67 | HR 65 | Temp 97.8°F | Resp 16

## 2015-10-04 DIAGNOSIS — E538 Deficiency of other specified B group vitamins: Secondary | ICD-10-CM | POA: Insufficient documentation

## 2015-10-04 DIAGNOSIS — Z23 Encounter for immunization: Secondary | ICD-10-CM

## 2015-10-04 DIAGNOSIS — E611 Iron deficiency: Secondary | ICD-10-CM

## 2015-10-04 MED ORDER — CYANOCOBALAMIN 1000 MCG/ML IJ SOLN
INTRAMUSCULAR | Status: AC
  Filled 2015-10-04: qty 1

## 2015-10-04 MED ORDER — INFLUENZA VAC SPLIT QUAD 0.5 ML IM SUSY
0.5000 mL | PREFILLED_SYRINGE | Freq: Once | INTRAMUSCULAR | Status: AC
  Administered 2015-10-04: 0.5 mL via INTRAMUSCULAR

## 2015-10-04 MED ORDER — CYANOCOBALAMIN 1000 MCG/ML IJ SOLN
1000.0000 ug | Freq: Once | INTRAMUSCULAR | Status: AC
  Administered 2015-10-04: 1000 ug via INTRAMUSCULAR

## 2015-10-04 NOTE — Patient Instructions (Signed)
[place] at [facility] Discharge Instructions  RECOMMENDATIONS MADE BY THE CONSULTANT AND ANY TEST RESULTS WILL BE SENT TO YOUR REFERRING PHYSICIAN.  Vitamin B12 1000 mcg injection (#2 of 4) given today as ordered. Return as scheduled.  Thank you for choosing [place] at [facility] to provide your oncology and hematology care.  To afford each patient quality time with our provider, please arrive at least 15 minutes before your scheduled appointment time.    You need to re-schedule your appointment should you arrive 10 or more minutes late.  We strive to give you quality time with our providers, and arriving late affects you and other patients whose appointments are after yours.  Also, if you no show three or more times for appointments you may be dismissed from the clinic at the providers discretion.     Again, thank you for choosing [facility].  Our hope is that these requests will decrease the amount of time that you wait before being seen by our physicians.       _____________________________________________________________  Should you have questions after your visit to [facility], please contact our office at [phone] between the hours of 8:30 a.m. and 4:30 p.m.  Voicemails left after 4:30 p.m. will not be returned until the following business day.  For prescription refill requests, have your pharmacy contact our office.

## 2015-10-04 NOTE — Progress Notes (Signed)
[name] presents today for injection per MD orders. B12 1000 mcg administered IM in right Upper Arm. Administration without incident. Patient tolerated well.  [name] presents today for injection per MD orders. Fluarix administered IM in left Upper Arm. Administration without incident. Patient tolerated well.

## 2015-10-11 ENCOUNTER — Encounter (HOSPITAL_BASED_OUTPATIENT_CLINIC_OR_DEPARTMENT_OTHER): Payer: Self-pay

## 2015-10-11 VITALS — BP 123/66 | HR 73 | Temp 98.0°F | Resp 16

## 2015-10-11 DIAGNOSIS — E538 Deficiency of other specified B group vitamins: Secondary | ICD-10-CM

## 2015-10-11 MED ORDER — CYANOCOBALAMIN 1000 MCG/ML IJ SOLN
INTRAMUSCULAR | Status: AC
Start: 2015-10-11 — End: 2015-10-11
  Filled 2015-10-11: qty 1

## 2015-10-11 MED ORDER — CYANOCOBALAMIN 1000 MCG/ML IJ SOLN: 1000.0000 ug | Freq: Once | INTRAMUSCULAR | Status: DC

## 2015-10-11 MED ORDER — CYANOCOBALAMIN 1000 MCG/ML IJ SOLN
1000.0000 ug | Freq: Once | INTRAMUSCULAR | Status: AC
  Administered 2015-10-11: 1000 ug via INTRAMUSCULAR

## 2015-10-11 NOTE — Progress Notes (Signed)
[name] presents today for injection per MD orders. B12 1046mcg administered IM in right Upper Arm. Administration without incident. Patient tolerated well.

## 2015-10-11 NOTE — Patient Instructions (Signed)
[place] at [facility] Discharge Instructions  RECOMMENDATIONS MADE BY THE CONSULTANT AND ANY TEST RESULTS WILL BE SENT TO YOUR REFERRING PHYSICIAN.  B12 injection.    Thank you for choosing [place] at [facility] to provide your oncology and hematology care.  To afford each patient quality time with our provider, please arrive at least 15 minutes before your scheduled appointment time.    You need to re-schedule your appointment should you arrive 10 or more minutes late.  We strive to give you quality time with our providers, and arriving late affects you and other patients whose appointments are after yours.  Also, if you no show three or more times for appointments you may be dismissed from the clinic at the providers discretion.     Again, thank you for choosing [facility].  Our hope is that these requests will decrease the amount of time that you wait before being seen by our physicians.       _____________________________________________________________  Should you have questions after your visit to [facility], please contact our office at [phone] between the hours of 8:30 a.m. and 4:30 p.m.  Voicemails left after 4:30 p.m. will not be returned until the following business day.  For prescription refill requests, have your pharmacy contact our office.

## 2015-10-17 ENCOUNTER — Encounter (HOSPITAL_BASED_OUTPATIENT_CLINIC_OR_DEPARTMENT_OTHER): Payer: Self-pay

## 2015-10-17 ENCOUNTER — Encounter (HOSPITAL_BASED_OUTPATIENT_CLINIC_OR_DEPARTMENT_OTHER): Payer: Self-pay | Admitting: Hematology & Oncology

## 2015-10-17 ENCOUNTER — Encounter (HOSPITAL_COMMUNITY): Payer: Self-pay | Admitting: Hematology & Oncology

## 2015-10-17 VITALS — BP 100/58 | HR 87 | Temp 98.3°F | Resp 18 | Wt 195.4 lb

## 2015-10-17 DIAGNOSIS — E538 Deficiency of other specified B group vitamins: Secondary | ICD-10-CM

## 2015-10-17 DIAGNOSIS — Z9884 Bariatric surgery status: Secondary | ICD-10-CM

## 2015-10-17 DIAGNOSIS — M25552 Pain in left hip: Secondary | ICD-10-CM

## 2015-10-17 DIAGNOSIS — M2550 Pain in unspecified joint: Secondary | ICD-10-CM

## 2015-10-17 DIAGNOSIS — K909 Intestinal malabsorption, unspecified: Secondary | ICD-10-CM

## 2015-10-17 DIAGNOSIS — E611 Iron deficiency: Secondary | ICD-10-CM

## 2015-10-17 DIAGNOSIS — M25551 Pain in right hip: Secondary | ICD-10-CM

## 2015-10-17 DIAGNOSIS — D509 Iron deficiency anemia, unspecified: Secondary | ICD-10-CM

## 2015-10-17 LAB — CBC WITH DIFFERENTIAL/PLATELET
BASOS ABS: 0 10*3/uL (ref 0.0–0.1)
Basophils Relative: 1 %
EOS ABS: 0.3 10*3/uL (ref 0.0–0.7)
EOS PCT: 5 %
HEMATOCRIT: 37.3 % (ref 36.0–46.0)
Hemoglobin: 12 g/dL (ref 12.0–15.0)
LYMPHS ABS: 1.8 10*3/uL (ref 0.7–4.0)
LYMPHS PCT: 30 %
MCH: 25.9 pg — ABNORMAL LOW (ref 26.0–34.0)
MCHC: 32.2 g/dL (ref 30.0–36.0)
MCV: 80.4 fL (ref 78.0–100.0)
MONO ABS: 0.3 10*3/uL (ref 0.1–1.0)
Monocytes Relative: 4 %
NEUTROS PCT: 60 %
Neutro Abs: 3.6 10*3/uL (ref 1.7–7.7)
Platelets: 243 10*3/uL (ref 150–400)
RBC: 4.64 MIL/uL (ref 3.87–5.11)
WBC: 5.9 10*3/uL (ref 4.0–10.5)

## 2015-10-17 LAB — IRON AND TIBC
Iron: 121 ug/dL (ref 28–170)
SATURATION RATIOS: 31 % (ref 10.4–31.8)
TIBC: 388 ug/dL (ref 250–450)
UIBC: 267 ug/dL

## 2015-10-17 LAB — FERRITIN: Ferritin: 42 ng/mL (ref 11–307)

## 2015-10-17 MED ORDER — CYANOCOBALAMIN 1000 MCG/ML IJ SOLN: 1000.0000 ug | INTRAMUSCULAR | Status: DC

## 2015-10-17 MED ORDER — CYANOCOBALAMIN 1000 MCG/ML IJ SOLN
1000.0000 ug | Freq: Once | INTRAMUSCULAR | Status: AC
  Administered 2015-10-17: 1000 ug via INTRAMUSCULAR
  Filled 2015-10-17: qty 1

## 2015-10-17 NOTE — Progress Notes (Signed)
Labs drawn

## 2015-10-17 NOTE — Progress Notes (Signed)
Please see doctors appt for more information 

## 2015-10-17 NOTE — Progress Notes (Signed)
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  Ms. Dimaria is here alone today.  She's done her B12's and two doses of iron. She complains of some joint pain and pain in her hips, and says that the second time it was worse. After the onset of the pain, it goes away in about three days.  She remarks that she doesn't crave dirt anymore.  Ms. Nault says she's still feeling tired, but adds that it may just be due to being the mother of a toddler.  She adds that her mind just won't stop at night, but also attributes this to being a mother.  She denies any problems in her left arm where the IV was inserted. She notes overall improvement in her energy and activity level.  PAST MEDICAL HISTORY:   Past Medical History  Diagnosis Date  . Obesity     gastric Bypass, Roux-en Y  . Migraines   . Frozen shoulder syndrome   . Depression   . Chronic knee pain   . Chronic left shoulder pain   . Chronic neck pain   . Patellofemoral syndrome, left   . Anxiety   . Chronic anemia   . Microcytic hypochromic anemia 09/12/2015  . Gastric bypass status for obesity 09/12/2015  . Vitamin B 12 deficiency 09/13/2015    ALLERGIES: Allergies  Allergen Reactions  . Morphine And Related Other (See Comments)    Internal burning sensation post surgical prcedure  . Nsaids Other (See Comments)    Due to gastric bypass  . Penicillins Itching and Swelling  . Tramadol     Makes her restless      MEDICATIONS: I  have reviewed the patient's current medications.    Current Outpatient Prescriptions on File Prior to Visit  Medication Sig Dispense Refill  . ergocalciferol (VITAMIN D2) 50000 UNITS capsule Take 1 capsule (50,000 Units total) by mouth once a week. (Patient not taking: Reported on 10/17/2015) 12 capsule 0  . FERROUS FUMARATE PO Take 22.5 mg by mouth daily. Take 2 tablets daily    . ibuprofen (ADVIL,MOTRIN) 200 MG tablet Take 800 mg by mouth every 6 (six) hours as needed for mild pain or moderate pain.    . Multiple Vitamins-Minerals (MULTI COMPLETE/IRON PO) Take 2 tablets by mouth daily.     . promethazine (PHENERGAN) 25 MG tablet Take 1 tablet (25 mg total) by mouth every 6 (six) hours as needed for nausea or vomiting (or headache). (Patient not taking: Reported on 09/12/2015) 8 tablet 0   No current facility-administered medications on file prior to visit.     PAST SURGICAL HISTORY Past Surgical History  Procedure Laterality Date  . Gastric bypass  04/20/2011    [place], wt 271lb preop  . Fertility surgery  2009    No abnormalities in female    FAMILY HISTORY: Family History  Problem Relation Age of Onset  . Diabetes Father   . Hyperlipidemia Father   .  Hypertension Father   . Cancer Father     skin cancer  . Depression Mother     lifelong  . Anesthesia problems Neg Hx   . Arthritis      SOCIAL HISTORY:  reports that she has never smoked. She has never used smokeless tobacco. She reports that she does not drink alcohol or use illicit drugs.  PERFORMANCE STATUS: The patient's performance status is 1 - Symptomatic but completely ambulatory  ROS 14 point review of systems was performed and is negative except as detailed under history of present illness and above  PHYSICAL EXAM: Most Recent Vital Signs: Blood pressure 100/58, pulse 87, temperature 98.3 F (36.8 C), resp. rate 18, weight 195 lb 6.4 oz (88.633 kg), SpO2 97 %, currently breastfeeding. General  appearance: alert, appears stated age, no distress and moderately obese Head: Normocephalic, without obvious abnormality, atraumatic Eyes: negative findings: lids and lashes normal, conjunctivae and sclerae normal and corneas clear Throat: lips, mucosa, and tongue normal; teeth and gums normal Neck: no adenopathy, supple, symmetrical, trachea midline and thyroid not enlarged, symmetric, no tenderness/mass/nodules Lungs: clear to auscultation bilaterally Heart: regular rate and rhythm, S1, S2 normal, no murmur, click, rub or gallop Extremities: extremities normal, atraumatic, no cyanosis or edema Pulses: 2+ and symmetric Skin: Skin color, texture, turgor normal. No rashes or lesions Lymph nodes: Cervical, supraclavicular, and axillary nodes normal. Neurologic: Alert and oriented X 3, normal strength and tone. Normal symmetric reflexes. Normal coordination and gait  LABORATORY DATA:  I have reviewed the data as listed  Results for orders placed or performed in visit on 10/17/15 (from the past 48 hour(s))  CBC with Differential     Status: Abnormal   Collection Time: 10/17/15 10:57 AM  Result Value Ref Range   WBC 5.9 4.0 - 10.5 K/uL   RBC 4.64 3.87 - 5.11 MIL/uL   Hemoglobin 12.0 12.0 - 15.0 g/dL   HCT 37.3 36.0 - 46.0 %   MCV 80.4 78.0 - 100.0 fL   MCH 25.9 (L) 26.0 - 34.0 pg   MCHC 32.2 30.0 - 36.0 g/dL   Platelets 243 150 - 400 K/uL   Neutrophils Relative % 60 %   Neutro Abs 3.6 1.7 - 7.7 K/uL   Lymphocytes Relative 30 %   Lymphs Abs 1.8 0.7 - 4.0 K/uL   Monocytes Relative 4 %   Monocytes Absolute 0.3 0.1 - 1.0 K/uL   Eosinophils Relative 5 %   Eosinophils Absolute 0.3 0.0 - 0.7 K/uL   Basophils Relative 1 %   Basophils Absolute 0.0 0.0 - 0.1 K/uL      Iron/TIBC/Ferritin/ %Sat    Component Value Date/Time   IRON 83 09/12/2015 1318   TIBC 545* 09/12/2015 1318   FERRITIN 4* 09/12/2015 1318   IRONPCTSAT 15 09/12/2015 1318   CBC    Component Value Date/Time   WBC 5.9  10/17/2015 1057   RBC 4.64 10/17/2015 1057   RBC 4.13 09/12/2015 1318   HGB 12.0 10/17/2015 1057   HCT 37.3 10/17/2015 1057   PLT 243 10/17/2015 1057   MCV 80.4 10/17/2015 1057   MCH 25.9* 10/17/2015 1057   MCHC 32.2 10/17/2015 1057   RDW 16.7* 09/12/2015 1318   LYMPHSABS 1.8 10/17/2015 1057   MONOABS 0.3 10/17/2015 1057   EOSABS 0.3 10/17/2015 1057   BASOSABS 0.0 10/17/2015 1057   CMP     Component Value Date/Time   NA 137 09/03/2015 1644   K 3.9 09/03/2015 1644   CL  106 09/03/2015 1644   CO2 27 09/03/2015 1644   GLUCOSE 86 09/03/2015 1644   BUN 11 09/03/2015 1644   CREATININE 0.62 09/03/2015 1644   CREATININE 0.60 04/20/2014 1250   CALCIUM 8.4* 09/03/2015 1644   PROT 6.5 03/31/2015 1818   ALBUMIN 3.6 03/31/2015 1818   AST 18 03/31/2015 1818   ALT 12 03/31/2015 1818   ALKPHOS 70 03/31/2015 1818   BILITOT 0.3 03/31/2015 1818   GFRNONAA >60 09/03/2015 1644   GFRNONAA >89 04/20/2014 1250   GFRAA >60 09/03/2015 1644   GFRAA >89 04/20/2014 1250   Urinalysis    Component Value Date/Time   COLORURINE STRAW* 09/03/2015 1616   APPEARANCEUR CLEAR 09/03/2015 1616   LABSPEC 1.015 09/03/2015 1616   PHURINE 5.0 09/03/2015 1616   GLUCOSEU NEGATIVE 09/03/2015 1616   HGBUR NEGATIVE 09/03/2015 1616   BILIRUBINUR NEGATIVE 09/03/2015 1616   KETONESUR NEGATIVE 09/03/2015 1616   PROTEINUR NEGATIVE 09/03/2015 1616   UROBILINOGEN 0.2 09/03/2015 1616   NITRITE NEGATIVE 09/03/2015 1616   LEUKOCYTESUR NEGATIVE 09/03/2015 1616    Results for KAYDYN, CHISM (MRN [id number]) as of 09/30/2015 20:02  Ref. Range 09/01/2015 22:06 09/02/2015 15:37 09/03/2015 16:16 09/03/2015 16:44 09/12/2015 13:18  Sodium Latest Ref Range: 135-145 mmol/L 142   137   Potassium Latest Ref Range: 3.5-5.1 mmol/L 3.6   3.9   Chloride Latest Ref Range: 101-111 mmol/L 104   106   CO2 Latest Ref Range: 22-32 mmol/L    27   BUN Latest Ref Range: 6-20 mg/dL 11   11   Creatinine Latest Ref Range: 0.44-1.00 mg/dL 0.60    0.62   Calcium Latest Ref Range: 8.9-10.3 mg/dL    8.4 (L)   EGFR (Non-African Amer.) Latest Ref Range: >60 mL/min    >60   EGFR (African American) Latest Ref Range: >60 mL/min    >60   Glucose Latest Ref Range: 65-99 mg/dL 78   86   Anion gap Latest Ref Range: 5-15     4 (L)   Calcium Ionized Latest Ref Range: 1.12-1.23 mmol/L 1.15      Iron Latest Ref Range: 28-170 ug/dL     83  UIBC Latest Units: ug/dL     462  TIBC Latest Ref Range: 250-450 ug/dL     545 (H)  Saturation Ratios Latest Ref Range: 10.4-31.8 %     15  Ferritin Latest Ref Range: 11-307 ng/mL     4 (L)  Copper Latest Ref Range: 72-166 ug/dL     137  Vit D, 25-Hydroxy Latest Ref Range: 30.0-100.0 ng/mL     20.2 (L)  Vitamin B-12 Latest Ref Range: 180-914 pg/mL     158 (L)  WBC Latest Ref Range: 4.0-10.5 K/uL    6.3 6.7  RBC Latest Ref Range: 3.87-5.11 MIL/uL    3.93 4.13  Hemoglobin Latest Ref Range: 12.0-15.0 g/dL 7.8 (L)   7.8 (L) 8.3 (L)  HCT Latest Ref Range: 36.0-46.0 % 23.0 (L)   26.7 (L) 28.0 (L)  MCV Latest Ref Range: 78.0-100.0 fL    67.9 (L) 67.8 (L)  MCH Latest Ref Range: 26.0-34.0 pg    19.8 (L) 20.1 (L)  MCHC Latest Ref Range: 30.0-36.0 g/dL    29.2 (L) 29.6 (L)  RDW Latest Ref Range: 11.5-15.5 %    16.3 (H) 16.7 (H)  Platelets Latest Ref Range: 150-400 K/uL    367 295  Neutrophils Latest Ref Range: 43-77 %    60 58  Lymphocytes Latest Ref  Range: 12-46 %    29 31  Monocytes Relative Latest Ref Range: 3-12 %    6 7  Eosinophil Latest Ref Range: 0-5 %    5 4  Basophil Latest Ref Range: 0-1 %    1 1  NEUT# Latest Ref Range: 1.7-7.7 K/uL    3.8 3.9  Lymphocyte # Latest Ref Range: 0.7-4.0 K/uL    1.8 2.1  Monocyte # Latest Ref Range: 0.1-1.0 K/uL    0.4 0.5  Eosinophils Absolute Latest Ref Range: 0.0-0.7 K/uL    0.3 0.2  Basophils Absolute Latest Ref Range: 0.0-0.1 K/uL    0.0 0.1  RBC Morphology Unknown     ELLIPTOCYTES  RBC. Latest Ref Range: 3.87-5.11 MIL/uL     4.13  Retic Ct Pct Latest Ref Range: 0.4-3.1  %     1.6  Retic Count, Manual Latest Ref Range: 19.0-186.0 K/uL     66.1  Preg Test, Ur Latest Ref Range: NEGATIVE    NEGATIVE    Appearance Latest Ref Range: CLEAR    CLEAR    Bilirubin Urine Latest Ref Range: NEGATIVE    NEGATIVE    Color, Urine Latest Ref Range: YELLOW    STRAW (A)    Glucose Latest Ref Range: NEGATIVE mg/dL   NEGATIVE    Hgb urine dipstick Latest Ref Range: NEGATIVE    NEGATIVE    Ketones, ur Latest Ref Range: NEGATIVE mg/dL   NEGATIVE    Leukocytes, UA Latest Ref Range: NEGATIVE    NEGATIVE    Nitrite Latest Ref Range: NEGATIVE    NEGATIVE    pH Latest Ref Range: 5.0-8.0    5.0    Protein Latest Ref Range: NEGATIVE mg/dL   NEGATIVE    Specific Gravity, Urine Latest Ref Range: 1.005-1.030    1.015    Urobilinogen, UA Latest Ref Range: 0.0-1.0 mg/dL   0.2     ASSESSMENT/PLAN:   Microcytic hypochromic anemia Iron 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 She was also diagnosed with B12 deficiency and is currently on IM replacement. She would like to continue these at home, she has given them to herself in the past.  She will be notified if she needs additional iron. She has done well with a significant improvement in her counts. I will see her back in 4 months. We may need to check labs earlier depending upon her total iron stores.   Note that she states that she already has syringes from when she was in [place].  Orders Placed This Encounter  Procedures  . CBC with Differential    Standing Status: Future     Number of Occurrences:      Standing Expiration Date: 10/16/2016  . Comprehensive metabolic panel    Standing Status: Future     Number of Occurrences:      Standing Expiration Date:  10/16/2016  . Ferritin    Standing Status: Future     Number of Occurrences:      Standing Expiration Date: 10/16/2016  . Vitamin B12    Standing Status: Future     Number of Occurrences:      Standing Expiration Date: 10/16/2016  . Iron and TIBC    Standing Status: Future     Number  of Occurrences:      Standing Expiration Date: 10/16/2016  . Folate    Standing Status: Future     Number of Occurrences:      Standing Expiration Date: 10/16/2016   All questions were answered. The patient knows to call the clinic with any problems, questions or concerns. We can certainly see the patient much sooner if necessary.  This document serves as a record of services personally performed by Ancil Linsey, MD. It was created on her behalf by Toni Amend, a trained medical scribe. The creation of this record is based on the scribe's personal observations and the provider's statements to them. This document has been checked and approved by the attending provider.  I have reviewed the above documentation for accuracy and completeness, and I agree with the above.  This note is electronically signed by: Molli Hazard, MD  10/17/2015 3:10 PM

## 2015-10-17 NOTE — Progress Notes (Signed)
..  [name] presents today for injection per the provider's orders.  Vitamin B12 administration without incident; see MAR for injection details.  Patient tolerated procedure well and without incident.  No questions or complaints noted at this time.

## 2015-10-18 ENCOUNTER — Telehealth (HOSPITAL_COMMUNITY): Payer: Self-pay

## 2015-10-18 ENCOUNTER — Other Ambulatory Visit (HOSPITAL_COMMUNITY): Payer: Self-pay | Admitting: Oncology

## 2015-10-18 DIAGNOSIS — D509 Iron deficiency anemia, unspecified: Secondary | ICD-10-CM

## 2015-10-18 NOTE — Telephone Encounter (Signed)
-----   Message from Patrici Ranks, MD sent at 10/18/2015  3:46 PM EDT -----  One dose feraheme. Dr.P

## 2015-10-18 NOTE — Telephone Encounter (Signed)
Message left for patient.  Once authorized she will be contacted with date and time of appointment for infusion.

## 2015-10-21 ENCOUNTER — Ambulatory Visit (HOSPITAL_COMMUNITY): Payer: Self-pay

## 2015-10-21 MED ORDER — SODIUM CHLORIDE 0.9 % IV SOLN
510.0000 mg | Freq: Once | INTRAVENOUS | Status: AC
  Filled 2015-10-21: qty 17

## 2015-10-21 MED ORDER — SODIUM CHLORIDE 0.9 % IV SOLN: INTRAVENOUS | Status: AC

## 2015-10-25 ENCOUNTER — Telehealth (HOSPITAL_COMMUNITY): Payer: Self-pay | Admitting: *Deleted

## 2015-10-25 NOTE — Telephone Encounter (Signed)
[name] called to say that her period has been very heavy this month, starting last Thursday. Continues to bleed and she said this has not been a problem before and she wonders if it is related to iron infusion. She also reports that she has an IUD that was placed by a Dr. at the health dept. And that her child jumped on her stomach a day or two before the bleeding started. It initially was spotting then began heavy bleeding that is incapacitating. I instructed her to call health department to see if the Dr. Parks Ranger put IUD in can see her, and that I will talk to Dr. Whitney Muse to see if she has other instructions

## 2015-10-28 ENCOUNTER — Encounter (HOSPITAL_BASED_OUTPATIENT_CLINIC_OR_DEPARTMENT_OTHER): Payer: Self-pay

## 2015-10-28 VITALS — BP 101/52 | HR 70 | Temp 98.3°F | Resp 16

## 2015-10-28 DIAGNOSIS — D509 Iron deficiency anemia, unspecified: Secondary | ICD-10-CM

## 2015-10-28 MED ORDER — FERUMOXYTOL INJECTION 510 MG/17 ML
510.0000 mg | Freq: Once | INTRAVENOUS | Status: AC
  Administered 2015-10-28: 510 mg via INTRAVENOUS
  Filled 2015-10-28: qty 17

## 2015-10-28 MED ORDER — SODIUM CHLORIDE 0.9 % IV SOLN
INTRAVENOUS | Status: DC
  Administered 2015-10-28: 11:00:00 via INTRAVENOUS

## 2015-10-28 NOTE — Progress Notes (Signed)
Patient reported to undersigned she will be administering her B12 injections at home herself. Tolerated iron infusion well.

## 2015-10-28 NOTE — Patient Instructions (Signed)
[facility] at [facility] Discharge Instructions  RECOMMENDATIONS MADE BY THE CONSULTANT AND ANY TEST RESULTS WILL BE SENT TO YOUR REFERRING PHYSICIAN.  Feraheme 510 mg iron infusion given today as ordered. Return as scheduled.  Thank you for choosing [facility] at [facility] to provide your oncology and hematology care.  To afford each patient quality time with our provider, please arrive at least 15 minutes before your scheduled appointment time.    You need to re-schedule your appointment should you arrive 10 or more minutes late.  We strive to give you quality time with our providers, and arriving late affects you and other patients whose appointments are after yours.  Also, if you no show three or more times for appointments you may be dismissed from the clinic at the providers discretion.     Again, thank you for choosing [facility].  Our hope is that these requests will decrease the amount of time that you wait before being seen by our physicians.       _____________________________________________________________  Should you have questions after your visit to  Cancer Center, please contact our office at [phone] between the hours of 8:30 a.m. and 4:30 p.m.  Voicemails left after 4:30 p.m. will not be returned until the following business day.  For prescription refill requests, have your pharmacy contact our office.    

## 2015-10-31 ENCOUNTER — Ambulatory Visit (HOSPITAL_COMMUNITY): Payer: Self-pay

## 2015-11-17 ENCOUNTER — Ambulatory Visit (HOSPITAL_COMMUNITY): Payer: Self-pay

## 2015-12-06 ENCOUNTER — Encounter (HOSPITAL_COMMUNITY): Payer: Self-pay

## 2015-12-15 ENCOUNTER — Ambulatory Visit (HOSPITAL_COMMUNITY): Payer: Self-pay

## 2015-12-22 ENCOUNTER — Encounter (HOSPITAL_COMMUNITY): Payer: Self-pay

## 2015-12-26 ENCOUNTER — Encounter (HOSPITAL_COMMUNITY): Payer: Self-pay | Admitting: *Deleted

## 2015-12-26 ENCOUNTER — Emergency Department (HOSPITAL_COMMUNITY)
Admission: EM | Admit: 2015-12-26 | Discharge: 2015-12-26 | Disposition: A | Discharge status: Home / Self Care | Payer: Medicaid Other | Attending: Emergency Medicine | Admitting: Emergency Medicine

## 2015-12-26 DIAGNOSIS — Z8659 Personal history of other mental and behavioral disorders: Secondary | ICD-10-CM | POA: Diagnosis not present

## 2015-12-26 DIAGNOSIS — E538 Deficiency of other specified B group vitamins: Secondary | ICD-10-CM | POA: Diagnosis not present

## 2015-12-26 DIAGNOSIS — Z8739 Personal history of other diseases of the musculoskeletal system and connective tissue: Secondary | ICD-10-CM | POA: Diagnosis not present

## 2015-12-26 DIAGNOSIS — M436 Torticollis: Secondary | ICD-10-CM | POA: Insufficient documentation

## 2015-12-26 DIAGNOSIS — G8929 Other chronic pain: Secondary | ICD-10-CM | POA: Insufficient documentation

## 2015-12-26 DIAGNOSIS — Z880 Allergy status to penicillin: Secondary | ICD-10-CM | POA: Diagnosis not present

## 2015-12-26 DIAGNOSIS — Z79899 Other long term (current) drug therapy: Secondary | ICD-10-CM | POA: Insufficient documentation

## 2015-12-26 DIAGNOSIS — Z9884 Bariatric surgery status: Secondary | ICD-10-CM | POA: Diagnosis not present

## 2015-12-26 DIAGNOSIS — D649 Anemia, unspecified: Secondary | ICD-10-CM | POA: Insufficient documentation

## 2015-12-26 DIAGNOSIS — Z8679 Personal history of other diseases of the circulatory system: Secondary | ICD-10-CM | POA: Insufficient documentation

## 2015-12-26 DIAGNOSIS — M542 Cervicalgia: Secondary | ICD-10-CM | POA: Diagnosis present

## 2015-12-26 MED ORDER — HYDROCODONE-ACETAMINOPHEN 5-325 MG PO TABS: 1.0000 | ORAL_TABLET | ORAL | Status: DC | PRN

## 2015-12-26 MED ORDER — HYDROCODONE-ACETAMINOPHEN 5-325 MG PO TABS
1.0000 | ORAL_TABLET | Freq: Once | ORAL | Status: AC
  Administered 2015-12-26: 1 via ORAL
  Filled 2015-12-26: qty 1

## 2015-12-26 MED ORDER — METHOCARBAMOL 500 MG PO TABS
500.0000 mg | ORAL_TABLET | Freq: Once | ORAL | Status: AC
  Administered 2015-12-26: 500 mg via ORAL
  Filled 2015-12-26: qty 1

## 2015-12-26 MED ORDER — METHOCARBAMOL 500 MG PO TABS: 1000.0000 mg | ORAL_TABLET | Freq: Four times a day (QID) | ORAL | Status: AC

## 2015-12-26 NOTE — Discharge Instructions (Signed)
Acute Torticollis °Torticollis is a condition in which the muscles of the neck tighten (contract) abnormally, causing the neck to twist and the head to move into an unnatural position. Torticollis that develops suddenly is called acute torticollis. If torticollis becomes chronic and is left untreated, the face and neck can become deformed. °CAUSES °This condition may be caused by: °· Sleeping in an awkward position (common). °· Extending or twisting the neck muscles beyond their normal position. °· Infection. °In some cases, the cause may not be known. °SYMPTOMS °Symptoms of this condition include: °· An unnatural position of the head. °· Neck pain. °· A limited ability to move the neck. °· Twisting of the neck to one side. °DIAGNOSIS °This condition is diagnosed with a physical exam. You may also have imaging tests, such as an X-ray, CT scan, or MRI. °TREATMENT °Treatment for this condition involves trying to relax the neck muscles. It may include: °· Medicines or shots. °· Physical therapy. °· Surgery. This may be done in severe cases. °HOME CARE INSTRUCTIONS °· Take medicines only as directed by your health care provider. °· Do stretching exercises and massage your neck as directed by your health care provider. °· Keep all follow-up visits as directed by your health care provider. This is important. °SEEK MEDICAL CARE IF: °· You develop a fever. °SEEK IMMEDIATE MEDICAL CARE IF: °· You develop difficulty breathing. °· You develop noisy breathing (stridor). °· You start drooling. °· You have trouble swallowing or have pain with swallowing. °· You develop numbness or weakness in your hands or feet. °· You have changes in your speech, understanding, or vision. °· Your pain gets worse. °  °This information is not intended to replace advice given to you by your health care provider. Make sure you discuss any questions you have with your health care provider. °  °Document Released: 12/14/2000 Document Revised:  05/03/2015 Document Reviewed: 12/13/2014 °Elsevier Interactive Patient Education ©2016 [employer] Inc[employer] ° °

## 2015-12-26 NOTE — ED Notes (Signed)
Pt states she woke up with neck pain. Pt has hx of nerve pain in her neck and shoulder and this feels the same. NAD noted upon triage.

## 2015-12-26 NOTE — ED Notes (Signed)
Pt verbalized understanding of no driving and to use caution within 4 hours of taking pain meds due to meds cause drowsiness 

## 2015-12-26 NOTE — ED Provider Notes (Signed)
CSN: [id number]     Arrival date & time 12/26/15  1557 History  By signing my name below, I, Detar North, attest that this documentation has been prepared under the direction and in the presence of Evalee Jefferson, PA-C. Electronically Signed: Virgel Bouquet, ED Scribe. 12/26/2015. 6:05 PM.    Chief Complaint  Patient presents with  . Neck Pain   The history is provided by the patient. No language interpreter was used.   HPI Comments: [name] is a 39 y.o. female with an hx of frozen shoulder who presents to the Emergency Department complaining of constant, mild neck pain onset this morning upon waking. Patient reports no recent injuries or lifting heavy objects but notes that she has had episodes of neck pain since her shoulder problems from several years ago.  Pain is worse with lateral rotation of the head, greater in severity with leftward rotation. She has taken 2x Tylenol-325 mg 8.5 hours ago and received a neck massage after a hot shower without relief of pain. Per patient, she notes similar symptoms in the past but is unsure of the course for resolution of her pain for these episodes. She is unable to take NSAIDs due to a gastric bypass, penicillin, morphine, or tramadol.  Patient is right-hand dominant and a homemaker. She denies any lifting, trauma or increased activity. Patient denies weakness or numbness in her upper extremities but notes occasional brief tingling sensation in her left dorsal hand..   Past Medical History  Diagnosis Date  . Obesity     gastric Bypass, Roux-en Y  . Migraines   . Frozen shoulder syndrome   . Depression   . Chronic knee pain   . Chronic left shoulder pain   . Chronic neck pain   . Patellofemoral syndrome, left   . Anxiety   . Chronic anemia   . Microcytic hypochromic anemia 09/12/2015  . Gastric bypass status for obesity 09/12/2015  . Vitamin B 12 deficiency 09/13/2015   Past Surgical History  Procedure Laterality Date  .  Gastric bypass  04/20/2011    [place], wt 271lb preop  . Fertility surgery  2009    No abnormalities in female   Family History  Problem Relation Age of Onset  . Diabetes Father   . Hyperlipidemia Father   . Hypertension Father   . Cancer Father     skin cancer  . Depression Mother     lifelong  . Anesthesia problems Neg Hx   . Arthritis     Social History  Substance Use Topics  . Smoking status: Never Smoker   . Smokeless tobacco: Never Used  . Alcohol Use: No   OB History    Gravida Para Term Preterm AB TAB SAB Ectopic Multiple Living   3 3 3  0 0 0 0 0 0 3     Review of Systems  Musculoskeletal: Positive for neck pain.  Neurological: Negative for weakness and numbness.      Allergies  Morphine and related; Nsaids; Penicillins; and Tramadol  Home Medications   Prior to Admission medications   Medication Sig Start Date End Date Taking? Authorizing Provider  cyanocobalamin (,VITAMIN B-12,) 1000 MCG/ML injection Inject 1 mL (1,000 mcg total) into the skin every 30 (thirty) days. 10/17/15   Patrici Ranks, MD  ergocalciferol (VITAMIN D2) 50000 UNITS capsule Take 1 capsule (50,000 Units total) by mouth once a week. Patient not taking: Reported on 10/17/2015 09/13/15   Baird Cancer, PA-C  FERROUS FUMARATE PO Take 22.5 mg by mouth daily. Take 2 tablets daily    Historical Provider, MD  HYDROcodone-acetaminophen (NORCO/VICODIN) 5-325 MG tablet Take 1 tablet by mouth every 4 (four) hours as needed. 12/26/15   Evalee Jefferson, PA-C  ibuprofen (ADVIL,MOTRIN) 200 MG tablet Take 800 mg by mouth every 6 (six) hours as needed for mild pain or moderate pain.    Historical Provider, MD  methocarbamol (ROBAXIN) 500 MG tablet Take 2 tablets (1,000 mg total) by mouth 4 (four) times daily. 12/26/15 01/05/16  Evalee Jefferson, PA-C  Multiple Vitamins-Minerals (MULTI COMPLETE/IRON PO) Take 2 tablets by mouth daily.     Historical Provider, MD  promethazine (PHENERGAN) 25 MG tablet Take 1  tablet (25 mg total) by mouth every 6 (six) hours as needed for nausea or vomiting (or headache). Patient not taking: Reported on 09/12/2015 09/03/15   Francine Graven, DO   BP 106/65 mmHg  Pulse 71  Temp(Src) 98.1 F (36.7 C) (Oral)  Resp 15  Ht 5\' 6"  (1.676 m)  Wt 90.719 kg  BMI 32.30 kg/m2  SpO2 100%  LMP 12/12/2015 Physical Exam  Constitutional: She appears well-developed and well-nourished.  HENT:  Head: Atraumatic.  Neck: Normal range of motion.  Cardiovascular:  Pulses equal bilaterally  Musculoskeletal: She exhibits tenderness.  TTP along her left trapezius distrubution with palpable spasm. Equal grip strength. No weakness in hand wrist, or elbow flexors or extensors. Distal sensations are intact in upper extremity. Decreased ROM with leftward head rotation.  Neurological: She is alert. She has normal strength. She displays normal reflexes. No sensory deficit.  Skin: Skin is warm and dry.  Psychiatric: She has a normal mood and affect.    ED Course  Procedures   DIAGNOSTIC STUDIES: Oxygen Saturation is 100% on RA, normal by my interpretation.    COORDINATION OF CARE: 5:18 PM Advised pt to use a heating pad and take hot showers followed by exercises BID to increase ROM. Will prescribe muscle relaxers and pain medication. Discussed treatment plan with pt at bedside and pt agreed to plan.   MDM   Final diagnoses:  Torticollis, acute      I personally performed the services described in this documentation, which was scribed in my presence. The recorded information has been reviewed and is accurate.   Evalee Jefferson, PA-C 12/27/15 1416  Ripley Fraise, MD 12/27/15 [phone]

## 2016-01-19 ENCOUNTER — Ambulatory Visit (HOSPITAL_COMMUNITY): Payer: Self-pay

## 2016-02-17 ENCOUNTER — Ambulatory Visit (HOSPITAL_COMMUNITY): Payer: Self-pay

## 2016-02-17 ENCOUNTER — Other Ambulatory Visit (HOSPITAL_COMMUNITY): Payer: Self-pay

## 2016-02-17 ENCOUNTER — Ambulatory Visit (HOSPITAL_COMMUNITY): Payer: Self-pay | Admitting: Hematology & Oncology

## 2016-02-29 ENCOUNTER — Other Ambulatory Visit (HOSPITAL_COMMUNITY): Payer: Self-pay

## 2016-02-29 ENCOUNTER — Ambulatory Visit (HOSPITAL_COMMUNITY): Payer: Self-pay | Admitting: Hematology & Oncology

## 2016-03-01 NOTE — Progress Notes (Signed)
This encounter was created in error - please disregard.

## 2016-03-05 ENCOUNTER — Other Ambulatory Visit (HOSPITAL_COMMUNITY): Payer: Self-pay | Admitting: Nurse Practitioner

## 2016-03-05 DIAGNOSIS — N852 Hypertrophy of uterus: Secondary | ICD-10-CM

## 2016-03-09 ENCOUNTER — Other Ambulatory Visit (HOSPITAL_COMMUNITY): Payer: Medicaid Other

## 2016-03-16 ENCOUNTER — Ambulatory Visit (HOSPITAL_COMMUNITY): Payer: Medicaid Other

## 2016-03-16 ENCOUNTER — Ambulatory Visit (HOSPITAL_COMMUNITY)
Admission: RE | Admit: 2016-03-16 | Discharge: 2016-03-16 | Disposition: A | Discharge status: Home / Self Care | Payer: Medicaid Other | Source: Ambulatory Visit | Attending: Nurse Practitioner | Admitting: Nurse Practitioner

## 2016-03-16 DIAGNOSIS — D259 Leiomyoma of uterus, unspecified: Secondary | ICD-10-CM | POA: Insufficient documentation

## 2016-03-16 DIAGNOSIS — N852 Hypertrophy of uterus: Secondary | ICD-10-CM | POA: Diagnosis present

## 2016-03-23 ENCOUNTER — Encounter (HOSPITAL_COMMUNITY): Payer: Medicaid Other | Attending: Hematology & Oncology | Admitting: Hematology & Oncology

## 2016-03-23 ENCOUNTER — Encounter (HOSPITAL_COMMUNITY): Payer: Medicaid Other

## 2016-03-23 VITALS — BP 107/63 | HR 71 | Temp 98.0°F | Resp 20 | Wt 216.1 lb

## 2016-03-23 DIAGNOSIS — F419 Anxiety disorder, unspecified: Secondary | ICD-10-CM | POA: Insufficient documentation

## 2016-03-23 DIAGNOSIS — Z79899 Other long term (current) drug therapy: Secondary | ICD-10-CM | POA: Diagnosis not present

## 2016-03-23 DIAGNOSIS — G8929 Other chronic pain: Secondary | ICD-10-CM | POA: Insufficient documentation

## 2016-03-23 DIAGNOSIS — E538 Deficiency of other specified B group vitamins: Secondary | ICD-10-CM | POA: Diagnosis not present

## 2016-03-23 DIAGNOSIS — K909 Intestinal malabsorption, unspecified: Secondary | ICD-10-CM

## 2016-03-23 DIAGNOSIS — Z9884 Bariatric surgery status: Secondary | ICD-10-CM

## 2016-03-23 DIAGNOSIS — Z9889 Other specified postprocedural states: Secondary | ICD-10-CM | POA: Insufficient documentation

## 2016-03-23 DIAGNOSIS — Z880 Allergy status to penicillin: Secondary | ICD-10-CM | POA: Diagnosis not present

## 2016-03-23 DIAGNOSIS — Z833 Family history of diabetes mellitus: Secondary | ICD-10-CM | POA: Insufficient documentation

## 2016-03-23 DIAGNOSIS — D509 Iron deficiency anemia, unspecified: Secondary | ICD-10-CM

## 2016-03-23 DIAGNOSIS — Z808 Family history of malignant neoplasm of other organs or systems: Secondary | ICD-10-CM | POA: Diagnosis not present

## 2016-03-23 DIAGNOSIS — E669 Obesity, unspecified: Secondary | ICD-10-CM | POA: Diagnosis not present

## 2016-03-23 DIAGNOSIS — F329 Major depressive disorder, single episode, unspecified: Secondary | ICD-10-CM | POA: Diagnosis not present

## 2016-03-23 LAB — COMPREHENSIVE METABOLIC PANEL
ALBUMIN: 3.8 g/dL (ref 3.5–5.0)
ALT: 14 U/L (ref 14–54)
AST: 20 U/L (ref 15–41)
Alkaline Phosphatase: 69 U/L (ref 38–126)
Anion gap: 8 (ref 5–15)
BUN: 12 mg/dL (ref 6–20)
CHLORIDE: 103 mmol/L (ref 101–111)
CO2: 28 mmol/L (ref 22–32)
Calcium: 8.6 mg/dL — ABNORMAL LOW (ref 8.9–10.3)
Creatinine, Ser: 0.63 mg/dL (ref 0.44–1.00)
GFR calc Af Amer: >60 mL/min (ref >60)
GFR calc non Af Amer: >60 mL/min (ref >60)
GLUCOSE: 101 mg/dL — AB (ref 65–99)
POTASSIUM: 3.9 mmol/L (ref 3.5–5.1)
SODIUM: 139 mmol/L (ref 135–145)
Total Bilirubin: 0.5 mg/dL (ref 0.3–1.2)
Total Protein: 6.8 g/dL (ref 6.5–8.1)

## 2016-03-23 LAB — VITAMIN B12: Vitamin B-12: 357 pg/mL (ref 180–914)

## 2016-03-23 LAB — CBC WITH DIFFERENTIAL/PLATELET
BASOS ABS: 0 10*3/uL (ref 0.0–0.1)
BASOS PCT: 0 %
EOS ABS: 0.3 10*3/uL (ref 0.0–0.7)
EOS PCT: 4 %
HEMATOCRIT: 39 % (ref 36.0–46.0)
Hemoglobin: 13.1 g/dL (ref 12.0–15.0)
Lymphocytes Relative: 27 %
Lymphs Abs: 2 10*3/uL (ref 0.7–4.0)
MCH: 30.7 pg (ref 26.0–34.0)
MCHC: 33.6 g/dL (ref 30.0–36.0)
MCV: 91.3 fL (ref 78.0–100.0)
MONO ABS: 0.4 10*3/uL (ref 0.1–1.0)
Monocytes Relative: 6 %
NEUTROS ABS: 4.6 10*3/uL (ref 1.7–7.7)
Neutrophils Relative %: 63 %
PLATELETS: 262 10*3/uL (ref 150–400)
RBC: 4.27 MIL/uL (ref 3.87–5.11)
RDW: 12.3 % (ref 11.5–15.5)
WBC: 7.3 10*3/uL (ref 4.0–10.5)

## 2016-03-23 LAB — FOLATE: FOLATE: 30 ng/mL (ref >5.9)

## 2016-03-23 LAB — FERRITIN: Ferritin: 14 ng/mL (ref 11–307)

## 2016-03-23 LAB — IRON AND TIBC
Iron: 63 ug/dL (ref 28–170)
SATURATION RATIOS: 16 % (ref 10.4–31.8)
TIBC: 398 ug/dL (ref 250–450)
UIBC: 335 ug/dL

## 2016-03-23 NOTE — Progress Notes (Signed)
[facility] Hematology/Oncology Consultation   Name: [name]      MRN: [id number]    Date: 03/23/2016 Time:4:25 PM   REFERRING PHYSICIAN:  [facility], [place], [place]  REASON FOR CONSULT:  Anemia  DIAGNOSIS:  Microcytic, hypochromic anemia  HISTORY OF PRESENT ILLNESS:   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  [name] returns to the [place] alone today. She says she's feeling good; "a whole lot better than when I started this process." She notes that she did not experience any problems with the iron while receiving it, aside from some flank pain that occurred after the infusion.   She confirms that her energy is better, though she notes that she is not sleeping well. She notes, however, that she thinks it's a "mind thing," not an iron thing. She denies any more weird cravings. She has not had any problems with the B12, noting that she has plenty of B12 and is having no problems using the syringes.  She notes that she had a sonogram here last week and was told she has a lot of fibroids. She says she had her IUD taken out, and since then, her second day of menstrual bleeding is really heavy. She is concerned that this might increase her iron needs.  PAST MEDICAL HISTORY:   Past Medical History  Diagnosis Date  . Obesity     gastric Bypass, Roux-en Y  . Migraines   . Frozen shoulder syndrome   . Depression   . Chronic knee pain   . Chronic left shoulder pain   . Chronic neck pain   . Patellofemoral syndrome, left   . Anxiety   . Chronic anemia   . Microcytic hypochromic anemia 09/12/2015  . Gastric bypass status for obesity 09/12/2015  . Vitamin B 12 deficiency 09/13/2015    ALLERGIES: Allergies  Allergen Reactions  . Morphine And Related  Other (See Comments)    Internal burning sensation post surgical prcedure  . Nsaids Other (See Comments)    Due to gastric bypass  . Penicillins Itching and Swelling  . Tramadol     Makes her restless      MEDICATIONS: I have reviewed the patient's current medications.    Current Outpatient Prescriptions on File Prior to Visit  Medication Sig Dispense Refill  . cyanocobalamin (,VITAMIN B-12,) 1000 MCG/ML injection Inject 1 mL (1,000 mcg total) into the skin every 30 (thirty) days. 10 mL 1  . FERROUS FUMARATE PO Take 22.5 mg by mouth daily. Take 2 tablets daily    . Multiple Vitamins-Minerals (MULTI COMPLETE/IRON PO) Take 2 tablets by mouth daily.     . ergocalciferol (VITAMIN D2) 50000 UNITS capsule Take 1 capsule (50,000 Units total) by mouth once a week. (Patient not taking: Reported on 10/17/2015) 12 capsule 0  . HYDROcodone-acetaminophen (NORCO/VICODIN) 5-325 MG tablet Take 1 tablet by mouth every 4 (four) hours as needed. (Patient not taking: Reported on 03/23/2016) 15 tablet 0  . ibuprofen (ADVIL,MOTRIN) 200 MG tablet Take 800 mg by mouth every 6 (six) hours as needed for mild pain or moderate pain. Reported on 03/23/2016    . promethazine (PHENERGAN) 25 MG tablet Take 1 tablet (25 mg total) by mouth every 6 (six) hours as needed for  nausea or vomiting (or headache). (Patient not taking: Reported on 09/12/2015) 8 tablet 0   Current Facility-Administered Medications on File Prior to Visit  Medication Dose Route Frequency Provider Last Rate Last Dose  . 0.9 %  sodium chloride infusion   Intravenous Continuous Allene Pyo, MD      . ferumoxytol (FERAHEME) 510 mg in sodium chloride 0.9 % 100 mL IVPB  510 mg Intravenous Once Allene Pyo, MD         PAST SURGICAL HISTORY Past Surgical History  Procedure Laterality Date  . Gastric bypass  04/20/2011    [place], [place] 271lb preop  . Fertility surgery  2009    No abnormalities in female    FAMILY HISTORY: Family History    Problem Relation Age of Onset  . Diabetes Father   . Hyperlipidemia Father   . Hypertension Father   . Cancer Father     skin cancer  . Depression Mother     lifelong  . Anesthesia problems Neg Hx   . Arthritis      SOCIAL HISTORY:  reports that she has never smoked. She has never used smokeless tobacco. She reports that she does not drink alcohol or use illicit drugs.  PERFORMANCE STATUS: The patient's performance status is 1 - Symptomatic but completely ambulatory  ROS 14 point review of systems was performed and is negative except as detailed under history of present illness and above   PHYSICAL EXAM: Most Recent Vital Signs: Blood pressure 107/63, pulse 71, temperature 98 F (36.7 C), temperature source Oral, resp. rate 20, weight 216 lb 1.6 oz (98.022 kg), SpO2 100 %, currently breastfeeding. General appearance: alert, appears stated age, no distress and moderately obese Head: Normocephalic, without obvious abnormality, atraumatic Eyes: negative findings: lids and lashes normal, conjunctivae and sclerae normal and corneas clear Throat: lips, mucosa, and tongue normal; teeth and gums normal Neck: no adenopathy, supple, symmetrical, trachea midline and thyroid not enlarged, symmetric, no tenderness/mass/nodules Lungs: clear to auscultation bilaterally Heart: regular rate and rhythm, S1, S2 normal, no murmur, click, rub or gallop Extremities: extremities normal, atraumatic, no cyanosis or edema Pulses: 2+ and symmetric Skin: Skin color, texture, turgor normal. No rashes or lesions Lymph nodes: Cervical, supraclavicular, and axillary nodes normal. Neurologic: Alert and oriented X 3, normal strength and tone. Normal symmetric reflexes. Normal coordination and gait  LABORATORY DATA:  I have reviewed the data as listed  Results for orders placed or performed in visit on 03/23/16 (from the past 48 hour(s))  CBC with Differential     Status: None   Collection Time: 03/23/16  10:53 AM  Result Value Ref Range   WBC 7.3 4.0 - 10.5 K/uL   RBC 4.27 3.87 - 5.11 MIL/uL   Hemoglobin 13.1 12.0 - 15.0 g/dL   HCT 60.7 63.7 - 76.4 %   MCV 91.3 78.0 - 100.0 fL   MCH 30.7 26.0 - 34.0 pg   MCHC 33.6 30.0 - 36.0 g/dL   RDW 90.4 25.0 - 53.9 %   Platelets 262 150 - 400 K/uL   Neutrophils Relative % 63 %   Neutro Abs 4.6 1.7 - 7.7 K/uL   Lymphocytes Relative 27 %   Lymphs Abs 2.0 0.7 - 4.0 K/uL   Monocytes Relative 6 %   Monocytes Absolute 0.4 0.1 - 1.0 K/uL   Eosinophils Relative 4 %   Eosinophils Absolute 0.3 0.0 - 0.7 K/uL   Basophils Relative 0 %   Basophils Absolute 0.0  0.0 - 0.1 K/uL  Comprehensive metabolic panel     Status: Abnormal   Collection Time: 03/23/16 10:53 AM  Result Value Ref Range   Sodium 139 135 - 145 mmol/L   Potassium 3.9 3.5 - 5.1 mmol/L   Chloride 103 101 - 111 mmol/L   CO2 28 22 - 32 mmol/L   Glucose, Bld 101 (H) 65 - 99 mg/dL   BUN 12 6 - 20 mg/dL   Creatinine, Ser 0.63 0.44 - 1.00 mg/dL   Calcium 8.6 (L) 8.9 - 10.3 mg/dL   Total Protein 6.8 6.5 - 8.1 g/dL   Albumin 3.8 3.5 - 5.0 g/dL   AST 20 15 - 41 U/L   ALT 14 14 - 54 U/L   Alkaline Phosphatase 69 38 - 126 U/L   Total Bilirubin 0.5 0.3 - 1.2 mg/dL   GFR calc non Af Amer >60 >60 mL/min   GFR calc Af Amer >60 >60 mL/min    Comment: (NOTE) The eGFR has been calculated using the CKD EPI equation. This calculation has not been validated in all clinical situations. eGFR's persistently <60 mL/min signify possible Chronic Kidney Disease.    Anion gap 8 5 - 15     Results for ESTALEE, MCCANDLISH (MRN [id number]) as of 03/27/2016 12:56  Ref. 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 She was also diagnosed with B12 deficiency and is currently on IM replacement. She would like to continue B12 at home.  She needs additional iron. We will arrange this for her.   I will put her down for 6 months follow-up, and if she's doing great, we might be able to see her once a year with labs every 6 months.  She knows to let us know if she feels she needs iron in the interim.    Orders Placed This Encounter  Procedures  . CBC with Differential    Standing Status: Future     Number of Occurrences:      Standing Expiration Date: 03/23/2018  . Comprehensive metabolic panel    Standing Status: Future     Number of Occurrences:      Standing Expiration Date: 03/23/2018  . Vitamin B12    Standing Status: Future     Number of Occurrences:      Standing Expiration Date: 03/23/2018  . Ferritin    Standing Status: Future     Number of Occurrences:      Standing Expiration Date: 03/23/2018  . Folate    Standing Status: Future     Number of Occurrences:      Standing Expiration Date: 03/23/2018   All questions were answered. The patient knows to call the clinic with any problems, questions or concerns. We can certainly see the patient much sooner if necessary.  This document serves as a record of services personally performed by Ancil Linsey, MD. It was created on her  behalf by Toni Amend, a trained medical scribe. The creation of this record is based on the scribe's personal observations and the provider's statements to them. This document has been checked and approved by the attending provider.  I have reviewed the above documentation for accuracy and  completeness, and I agree with the above.  This note is electronically signed by:  Molli Hazard, MD  03/23/2016 4:25 PM

## 2016-03-23 NOTE — Patient Instructions (Addendum)
[place] at [facility] Discharge Instructions  RECOMMENDATIONS MADE BY THE CONSULTANT AND ANY TEST RESULTS WILL BE SENT TO YOUR REFERRING PHYSICIAN.   Exam and discussion by Dr Whitney Muse today Blood counts are good today, 13 iron levels are still pending we will call you with those results If you start to feel bad before you come back you can call us and we can get you in and check your labs earlier than 6 months  Return to see the doctor in 6 months with labs  Please call the clinic if you have any questions or concerns    Thank you for choosing [place] at [facility] to provide your oncology and hematology care.  To afford each patient quality time with our provider, please arrive at least 15 minutes before your scheduled appointment time.   Beginning January 23rd 2017 lab work for the [facility] will be done in the  Main lab at [facility] on 1st floor. If you have a lab appointment with the [facility] please come in thru the  Main Entrance and check in at the main information desk  You need to re-schedule your appointment should you arrive 10 or more minutes late.  We strive to give you quality time with our providers, and arriving late affects you and other patients whose appointments are after yours.  Also, if you no show three or more times for appointments you may be dismissed from the clinic at the providers discretion.     Again, thank you for choosing [facility].  Our hope is that these requests will decrease the amount of time that you wait before being seen by our physicians.       _____________________________________________________________  Should you have questions after your visit to [facility], please contact our office at [phone] between the hours of 8:30 a.m. and 4:30 p.m.  Voicemails left after 4:30 p.m. will not be returned until the following business day.  For prescription  refill requests, have your pharmacy contact our office.         Resources For Cancer Patients and their Caregivers ? American Cancer Society: Can assist with transportation, wigs, general needs, runs Look Good Feel Better.        619-115-4998 ? Cancer Care: Provides financial assistance, online support groups, medication/co-pay assistance.  1-800-813-HOPE (612)375-5131) ? [place] Assists [place] cancer patients and their families through emotional , educational and financial support.  707-659-9928 ? [place] DSS Where to apply for food stamps, Medicaid and utility assistance. 8506563239 ? [employer]: Transportation to medical appointments. 530-551-3087 ? Social Security Administration: May apply for disability if have a Stage IV cancer. ([phone] ? [employer], Disability and Transit Services: Assists with nutrition, care and transit needs. [phone]

## 2016-03-25 ENCOUNTER — Other Ambulatory Visit (HOSPITAL_COMMUNITY): Payer: Self-pay | Admitting: Hematology & Oncology

## 2016-03-26 ENCOUNTER — Encounter (HOSPITAL_COMMUNITY): Payer: Self-pay

## 2016-03-27 ENCOUNTER — Encounter (HOSPITAL_COMMUNITY): Payer: Self-pay | Admitting: Hematology & Oncology

## 2016-04-03 ENCOUNTER — Encounter (HOSPITAL_COMMUNITY): Payer: Medicaid Other | Attending: Hematology & Oncology

## 2016-04-03 ENCOUNTER — Encounter: Payer: Self-pay | Admitting: Advanced Practice Midwife

## 2016-04-03 ENCOUNTER — Ambulatory Visit (INDEPENDENT_AMBULATORY_CARE_PROVIDER_SITE_OTHER): Payer: Medicaid Other | Admitting: Advanced Practice Midwife

## 2016-04-03 VITALS — BP 103/63 | HR 69 | Temp 98.0°F | Resp 18

## 2016-04-03 VITALS — BP 104/74 | HR 76 | Ht 65.0 in | Wt 220.0 lb

## 2016-04-03 DIAGNOSIS — F329 Major depressive disorder, single episode, unspecified: Secondary | ICD-10-CM | POA: Insufficient documentation

## 2016-04-03 DIAGNOSIS — F419 Anxiety disorder, unspecified: Secondary | ICD-10-CM | POA: Insufficient documentation

## 2016-04-03 DIAGNOSIS — Z880 Allergy status to penicillin: Secondary | ICD-10-CM | POA: Insufficient documentation

## 2016-04-03 DIAGNOSIS — D250 Submucous leiomyoma of uterus: Secondary | ICD-10-CM

## 2016-04-03 DIAGNOSIS — Z808 Family history of malignant neoplasm of other organs or systems: Secondary | ICD-10-CM | POA: Insufficient documentation

## 2016-04-03 DIAGNOSIS — Z833 Family history of diabetes mellitus: Secondary | ICD-10-CM | POA: Insufficient documentation

## 2016-04-03 DIAGNOSIS — D509 Iron deficiency anemia, unspecified: Secondary | ICD-10-CM | POA: Diagnosis not present

## 2016-04-03 DIAGNOSIS — Z9889 Other specified postprocedural states: Secondary | ICD-10-CM | POA: Insufficient documentation

## 2016-04-03 DIAGNOSIS — G8929 Other chronic pain: Secondary | ICD-10-CM | POA: Insufficient documentation

## 2016-04-03 DIAGNOSIS — E538 Deficiency of other specified B group vitamins: Secondary | ICD-10-CM | POA: Insufficient documentation

## 2016-04-03 DIAGNOSIS — Z79899 Other long term (current) drug therapy: Secondary | ICD-10-CM | POA: Insufficient documentation

## 2016-04-03 DIAGNOSIS — E669 Obesity, unspecified: Secondary | ICD-10-CM | POA: Insufficient documentation

## 2016-04-03 MED ORDER — SODIUM CHLORIDE 0.9% FLUSH
10.0000 mL | Freq: Once | INTRAVENOUS | Status: AC
  Administered 2016-04-03: 10 mL via INTRAVENOUS

## 2016-04-03 MED ORDER — SODIUM CHLORIDE 0.9 % IV SOLN
INTRAVENOUS | Status: DC
  Administered 2016-04-03: 13:00:00 via INTRAVENOUS

## 2016-04-03 MED ORDER — SODIUM CHLORIDE 0.9 % IV SOLN
510.0000 mg | Freq: Once | INTRAVENOUS | Status: AC
  Administered 2016-04-03: 510 mg via INTRAVENOUS
  Filled 2016-04-03: qty 17

## 2016-04-03 MED ORDER — ACETAMINOPHEN 325 MG PO TABS
ORAL_TABLET | ORAL | Status: AC
  Filled 2016-04-03: qty 2

## 2016-04-03 NOTE — Patient Instructions (Signed)
[place] at [facility] Discharge Instructions  RECOMMENDATIONS MADE BY THE CONSULTANT AND ANY TEST RESULTS WILL BE SENT TO YOUR REFERRING PHYSICIAN.  Ferritin level 14. MD would like for the number to average 100. Feraheme 510 mg iron infusion given today as ordered. Return as scheduled.  Thank you for choosing [place] at [facility] to provide your oncology and hematology care.  To afford each patient quality time with our provider, please arrive at least 15 minutes before your scheduled appointment time.   Beginning January 23rd 2017 lab work for the [facility] will be done in the  Main lab at [facility] on 1st floor. If you have a lab appointment with the [place] [facility] please come in thru the  Main Entrance and check in at the main information desk  You need to re-schedule your appointment should you arrive 10 or more minutes late.  We strive to give you quality time with our providers, and arriving late affects you and other patients whose appointments are after yours.  Also, if you no show three or more times for appointments you may be dismissed from the clinic at the providers discretion.     Again, thank you for choosing [facility].  Our hope is that these requests will decrease the amount of time that you wait before being seen by our physicians.       _____________________________________________________________  Should you have questions after your visit to [facility], please contact our office at [phone] between the hours of 8:30 a.m. and 4:30 p.m.  Voicemails left after 4:30 p.m. will not be returned until the following business day.  For prescription refill requests, have your pharmacy contact our office.         Resources For Cancer Patients and their Caregivers ? American Cancer Society: Can assist with transportation, wigs, general needs, runs Look Good Feel Better.         704-121-4313 ? Cancer Care: Provides financial assistance, online support groups, medication/co-pay assistance.  1-800-813-HOPE 8028253842) ? [place] Assists [place] cancer patients and their families through emotional , educational and financial support.  718-879-4200 ? [place] DSS Where to apply for food stamps, Medicaid and utility assistance. 804-843-8673 ? [employer]: Transportation to medical appointments. 281-398-4598 ? Social Security Administration: May apply for disability if have a Stage IV cancer. [phone] ? [employer], Disability and Transit Services: Assists with nutrition, care and transit needs. 570-643-9710

## 2016-04-03 NOTE — Progress Notes (Signed)
[facility] Visit  Patient name: [name] MRN [id number]  Date of birth: May 24, 1976  CC & HPI:  [name] is a 40 y.o. Caucasian female presenting today to discuss US/fertility.  Had Paragard removed Feb 2017, wanting to get pregnant. Husband is father of her 35 year old.  Prior to conceiving her 40 YO, she had spent 2 years trying to conceive  She had gastriv bypass and a HSG, which "unblocked one of my tubes."  3 mo nths later, she got pregnant unassisted. She had an [place] last week d/t "enlarged uterus", which showed:   Measurements: 9.1 x 4.9 x 6.5 cm. Uterine fibroids are noted. The largest fibroid measures 2.5 x 1.9 x 1.8 cm in the left aspect of the myometrium  Using ovulation predictor kits, showing + LS surge day 13-14.  LMP 3/11.  Taking PNV.   Pertinent History Reviewed:  Medical & Surgical Hx:   Past Medical History  Diagnosis Date  . Obesity     gastric Bypass, Roux-en Y  . Migraines   . Frozen shoulder syndrome   . Chronic knee pain   . Chronic left shoulder pain   . Chronic neck pain   . Patellofemoral syndrome, left   . Anxiety   . Chronic anemia   . Microcytic hypochromic anemia 09/12/2015  . Gastric bypass status for obesity 09/12/2015  . Vitamin B 12 deficiency 09/13/2015  . Depression    Past Surgical History  Procedure Laterality Date  . Gastric bypass  04/20/2011    [place], [place] 271lb preop  . Fertility surgery  2009    No abnormalities in female   Family History  Problem Relation Age of Onset  . Diabetes Father   . Hyperlipidemia Father   . Hypertension Father   . Cancer Father     skin cancer  . Depression Mother     lifelong  . Asthma Mother   . Anesthesia problems Neg Hx     Current outpatient prescriptions:  .  cyanocobalamin (,VITAMIN B-12,) 1000 MCG/ML injection, Inject 1 mL (1,000 mcg total) into the skin every 30 (thirty) days., Disp: 10 mL, Rfl: 1 .  FERROUS FUMARATE PO, Take 22.5 mg by mouth daily.  Take 2 tablets daily, Disp: , Rfl:  .  FOLIC ACID PO, Take 1 tablet by mouth daily., Disp: , Rfl:  .  ibuprofen (ADVIL,MOTRIN) 200 MG tablet, Take 800 mg by mouth every 6 (six) hours as needed for mild pain or moderate pain. Reported on 03/23/2016, Disp: , Rfl:  .  Multiple Vitamins-Minerals (MULTI COMPLETE/IRON PO), Take 2 tablets by mouth daily. , Disp: , Rfl:  .  NON FORMULARY, B12 and Iron infusion at [facility], Disp: , Rfl:  .  HYDROcodone-acetaminophen (NORCO/VICODIN) 5-325 MG tablet, Take 1 tablet by mouth every 4 (four) hours as needed. (Patient not taking: Reported on 03/23/2016), Disp: 15 tablet, Rfl: 0 No current facility-administered medications for this visit.  Facility-Administered Medications Ordered in Other Visits:  .  0.9 %  sodium chloride infusion, , Intravenous, Continuous, Patrici Ranks, MD .  ferumoxytol (FERAHEME) 510 mg in sodium chloride 0.9 % 100 mL IVPB, 510 mg, Intravenous, Once, Patrici Ranks, MD Social History: Reviewed -  reports that she has never smoked. She has never used smokeless tobacco.  Review of Systems:   Constitutional: Negative for fever and chills Eyes: Negative for visual disturbances Respiratory: Negative for shortness of breath, dyspnea Cardiovascular: Negative for chest pain  or palpitations  Gastrointestinal: Negative for vomiting, diarrhea and constipation; no abdominal pain Genitourinary: Negative for dysuria and urgency, vaginal irritation or itching Musculoskeletal: Negative for back pain, joint pain, myalgias  Neurological: Negative for dizziness and headaches    Objective Findings:    Physical Examination: General appearance - well appearing, and in no distress Mental status - alert, oriented to person, place, and time Chest:  Normal respiratory effort Heart - normal rate and regular rhythm Abdomen:  Soft, nontender Musculoskeletal:  Normal range of motion without pain Extremities:  No edema  50% or more of this visit was  spent in counseling and coordination of care.  15 minutes of face to face time.   No results found for this or any previous visit (from the past 24 hour(s)).    Assessment & Plan:  A:   Multiple fibroids, largest 2.5cm.  2 cycles off contraception, awaiting either + UPT or period.    P:  Doubtful that fibroids would interfere with pregnancy.  Pt anxious d/t her age.  If not pregnant in 6 months, see Dr. Elonda Husky (clomid ?, get opinion on whether fibroids need to be removed).  Continue PNV.    Return if symptoms worsen or fail to improve.  [name],Marissia Blackham CNM 04/03/2016 3:09 PM

## 2016-04-03 NOTE — Progress Notes (Signed)
Tolerated iron infusion well. Ambulatory on discharge home to self. 

## 2016-04-03 NOTE — Patient Instructions (Signed)
Clomiphene tablets What is this medicine? CLOMIPHENE (KLOE mi feen) is a fertility drug that increases the chance of pregnancy. It helps women ovulate (produce a mature egg) during their cycle. This medicine may be used for other purposes; ask your health care provider or pharmacist if you have questions. What should I tell my health care provider before I take this medicine? They need to know if you have any of these conditions: -adrenal gland disease -blood vessel disease or blood clots -cyst on the ovary -endometriosis -liver disease -ovarian cancer -pituitary gland disease -vaginal bleeding that has not been evaluated -an unusual or allergic reaction to clomiphene, other medicines, foods, dyes, or preservatives -pregnant (should not be used if you are already pregnant) -breast-feeding How should I use this medicine? Take this medicine by mouth with a glass of water. Follow the directions on the prescription label. Take exactly as directed for the exact number of days prescribed. Take your doses at regular intervals. Most women take this medicine for a 5 day period, but the length of treatment may be adjusted. Your doctor will give you a start date for this medication and will give you instructions on proper use. Do not take your medicine more often than directed. Talk to your pediatrician regarding the use of this medicine in children. Special care may be needed. Overdosage: If you think you have taken too much of this medicine contact a poison control center or emergency room at once. NOTE: This medicine is only for you. Do not share this medicine with others. What if I miss a dose? If you miss a dose, take it as soon as you can. If it is almost time for your next dose, take only that dose. Do not take double or extra doses. What may interact with this medicine? -herbal or dietary supplements, like blue cohosh, black cohosh, chasteberry, or DHEA -prasterone This list may not describe  all possible interactions. Give your health care provider a list of all the medicines, herbs, non-prescription drugs, or dietary supplements you use. Also tell them if you smoke, drink alcohol, or use illegal drugs. Some items may interact with your medicine. What should I watch for while using this medicine? Make sure you understand how and when to use this medicine. You need to know when you are ovulating and when to have sexual intercourse. This will increase the chance of a pregnancy. Visit your doctor or health care professional for regular checks on your progress. You may need tests to check the hormone levels in your blood or you may have to use home-urine tests to check for ovulation. Try to keep any appointments. Compared to other fertility treatments, this medicine does not greatly increase your chances of having multiple babies. An increased chance of having twins may occur in roughly 5 out of every 100 women who take this medication. Stop taking this medicine at once and contact your doctor or health care professional if you think you are pregnant. This medicine is not for long-term use. Most women that benefit from this medicine do so within the first three cycles (months). Your doctor or health care professional will monitor your condition. This medicine is usually used for a total of 6 cycles of treatment. You may get drowsy or dizzy. Do not drive, use machinery, or do anything that needs mental alertness until you know how this drug affects you. Do not stand or sit up quickly. This reduces the risk of dizzy or fainting spells. Drinking alcoholic beverages  or smoking tobacco may decrease your chance of becoming pregnant. Limit or stop alcohol and tobacco use during your fertility treatments. What side effects may I notice from receiving this medicine? Side effects that you should report to your doctor or health care professional as soon as possible: -allergic reactions like skin rash,  itching or hives, swelling of the face, lips, or tongue -breathing problems -changes in vision -fluid retention -nausea, vomiting -pelvic pain or bloating -severe abdominal pain -sudden weight gain Side effects that usually do not require medical attention (report to your doctor or health care professional if they continue or are bothersome): -breast discomfort -hot flashes -mild pelvic discomfort -mild nausea This list may not describe all possible side effects. Call your doctor for medical advice about side effects. You may report side effects to FDA at 1-800-FDA-1088. Where should I keep my medicine? Keep out of the reach of children. Store at room temperature between 15 and 30 degrees C (59 and 86 degrees F). Protect from heat, light, and moisture. Throw away any unused medicine after the expiration date. NOTE: This sheet is a summary. It may not cover all possible information. If you have questions about this medicine, talk to your doctor, pharmacist, or health care provider.    2016, Elsevier/[employer] Standard. (2008-03-29 22:21:06)

## 2016-04-05 ENCOUNTER — Encounter: Payer: Self-pay | Admitting: Advanced Practice Midwife

## 2016-04-05 ENCOUNTER — Telehealth (HOSPITAL_COMMUNITY): Payer: Self-pay | Admitting: *Deleted

## 2016-04-05 NOTE — Telephone Encounter (Signed)
Returned call to patient. Husband answered phone and said patient was unavailable but he knew she had called about having diarrhea and wanted to know if it was from iron infusion. Per Dr.Penland informed husband that it was unlikely that the diarrhea was from the iron infusion but not impossible. Instruct that patient could take over-the-counter imodium to relieve diarrhea. Informed that this would not prevent patient from receiving future iron infusions but if in the future she had diarrhea following an iron infusion they would have to change the type of iron infusion she receives. Verbalized understanding.

## 2016-05-02 ENCOUNTER — Encounter (HOSPITAL_COMMUNITY): Payer: Self-pay | Admitting: Emergency Medicine

## 2016-05-02 ENCOUNTER — Emergency Department (HOSPITAL_COMMUNITY)
Admission: EM | Admit: 2016-05-02 | Discharge: 2016-05-02 | Disposition: A | Discharge status: Home / Self Care | Payer: Medicaid Other | Attending: Emergency Medicine | Admitting: Emergency Medicine

## 2016-05-02 DIAGNOSIS — Y999 Unspecified external cause status: Secondary | ICD-10-CM | POA: Insufficient documentation

## 2016-05-02 DIAGNOSIS — S0502XA Injury of conjunctiva and corneal abrasion without foreign body, left eye, initial encounter: Secondary | ICD-10-CM | POA: Insufficient documentation

## 2016-05-02 DIAGNOSIS — H53149 Visual discomfort, unspecified: Secondary | ICD-10-CM | POA: Diagnosis not present

## 2016-05-02 DIAGNOSIS — F329 Major depressive disorder, single episode, unspecified: Secondary | ICD-10-CM | POA: Diagnosis not present

## 2016-05-02 DIAGNOSIS — Y929 Unspecified place or not applicable: Secondary | ICD-10-CM | POA: Insufficient documentation

## 2016-05-02 DIAGNOSIS — R200 Anesthesia of skin: Secondary | ICD-10-CM | POA: Diagnosis not present

## 2016-05-02 DIAGNOSIS — Y939 Activity, unspecified: Secondary | ICD-10-CM | POA: Diagnosis not present

## 2016-05-02 DIAGNOSIS — X58XXXA Exposure to other specified factors, initial encounter: Secondary | ICD-10-CM | POA: Diagnosis not present

## 2016-05-02 DIAGNOSIS — H578 Other specified disorders of eye and adnexa: Secondary | ICD-10-CM | POA: Diagnosis present

## 2016-05-02 DIAGNOSIS — E669 Obesity, unspecified: Secondary | ICD-10-CM | POA: Insufficient documentation

## 2016-05-02 MED ORDER — KETOROLAC TROMETHAMINE 0.5 % OP SOLN
1.0000 [drp] | Freq: Once | OPHTHALMIC | Status: AC
  Administered 2016-05-02: 1 [drp] via OPHTHALMIC
  Filled 2016-05-02: qty 5

## 2016-05-02 MED ORDER — FLUORESCEIN SODIUM 1 MG OP STRP
1.0000 | ORAL_STRIP | Freq: Once | OPHTHALMIC | Status: AC
Start: 2016-05-02 — End: 2016-05-02
  Administered 2016-05-02: 1 via OPHTHALMIC
  Filled 2016-05-02: qty 1

## 2016-05-02 MED ORDER — TETRACAINE HCL 0.5 % OP SOLN
OPHTHALMIC | Status: AC
  Filled 2016-05-02: qty 4

## 2016-05-02 MED ORDER — TOBRAMYCIN 0.3 % OP SOLN
1.0000 [drp] | Freq: Once | OPHTHALMIC | Status: AC
  Administered 2016-05-02: 1 [drp] via OPHTHALMIC
  Filled 2016-05-02: qty 5

## 2016-05-02 MED ORDER — TETRACAINE HCL 0.5 % OP SOLN
2.0000 [drp] | Freq: Once | OPHTHALMIC | Status: AC
  Administered 2016-05-02: 2 [drp] via OPHTHALMIC
  Filled 2016-05-02: qty 4

## 2016-05-02 NOTE — Discharge Instructions (Signed)
Corneal Abrasion °The cornea is the clear covering at the front and center of the eye. When you look at the colored portion of the eye, you are looking through the cornea. It is a thin tissue made up of layers. The top layer is the most sensitive layer. A corneal abrasion happens if this layer is scratched or an injury causes it to come off.  °HOME CARE °· You may be given drops or a medicated cream. Use the medicine as told by your doctor. °· A pressure patch may be put over the eye. If this is done, follow your doctor's instructions for when to remove the patch. Do not drive or use machines while the eye patch is on. Judging distances is hard to do with a patch on. °· See your doctor for a follow-up exam if you are told to do so. It is very important that you keep this appointment. °GET HELP IF:  °· You have pain, are sensitive to light, and have a scratchy feeling in one eye or both eyes. °· Your pressure patch keeps getting loose. You can blink your eye under the patch. °· You have fluid coming from your eye or the lids stick together in the morning. °· You have the same symptoms in the morning that you did with the first abrasion. This could be days, weeks, or months after the first abrasion healed. °  °This information is not intended to replace advice given to you by your health care provider. Make sure you discuss any questions you have with your health care provider. °  °Document Released: 06/04/2008 Document Revised: 09/07/2015 Document Reviewed: 08/24/2013 °Elsevier Interactive Patient Education ©2016 [employer] Inc. ° °

## 2016-05-02 NOTE — ED Notes (Signed)
Pt reports "feels like something is in my left eye". Pt denies any known foreign bodies. Pt reports woke up this am and reports "eye was stuck together." pt reports redness last night and minimal drainage. No redness or drainage noted at this time. Pt reports pain with blinking.

## 2016-05-02 NOTE — ED Notes (Signed)
Patient verbalizes understanding of discharge instructions, medications, home care and follow up care. Patient out of department at this time.

## 2016-05-05 NOTE — ED Provider Notes (Signed)
CSN: [id number]     Arrival date & time 05/02/16  1502 History   First MD Initiated Contact with Patient 05/02/16 1525     Chief Complaint  Patient presents with  . Eye Problem     (Consider location/radiation/quality/duration/timing/severity/associated sxs/prior Treatment) HPI   [name] is a 40 y.o. female who presents to the Emergency Department complaining of left eye FB sensation.  She states that she woke up with feeling like something is in her eye.  Pain with blinking and excessive tearing.  She also notes having crusting to her eye.  She denies trauma, facial pain, contact use, fever, and visual changes.   Past Medical History  Diagnosis Date  . Obesity     gastric Bypass, Roux-en Y  . Migraines   . Frozen shoulder syndrome   . Chronic knee pain   . Chronic left shoulder pain   . Chronic neck pain   . Patellofemoral syndrome, left   . Anxiety   . Chronic anemia   . Microcytic hypochromic anemia 09/12/2015  . Gastric bypass status for obesity 09/12/2015  . Vitamin B 12 deficiency 09/13/2015  . Depression    Past Surgical History  Procedure Laterality Date  . Gastric bypass  04/20/2011    [place], wt 271lb preop  . Fertility surgery  2009    No abnormalities in female   Family History  Problem Relation Age of Onset  . Diabetes Father   . Hyperlipidemia Father   . Hypertension Father   . Cancer Father     skin cancer  . Depression Mother     lifelong  . Asthma Mother   . Anesthesia problems Neg Hx    Social History  Substance Use Topics  . Smoking status: Never Smoker   . Smokeless tobacco: Never Used  . Alcohol Use: No   OB History    Gravida Para Term Preterm AB TAB SAB Ectopic Multiple Living   3 3 3  0 0 0 0 0 0 3     Review of Systems  Constitutional: Negative for fever, activity change and appetite change.  HENT: Negative for congestion and sore throat.   Eyes: Positive for photophobia, pain and discharge. Negative for redness.   Respiratory: Negative for cough.   Neurological: Positive for numbness. Negative for dizziness and headaches.  All other systems reviewed and are negative.     Allergies  Morphine and related; Nsaids; Penicillins; and Tramadol  Home Medications   Prior to Admission medications   Medication Sig Start Date End Date Taking? Authorizing Provider  cyanocobalamin (,VITAMIN B-12,) 1000 MCG/ML injection Inject 1 mL (1,000 mcg total) into the skin every 30 (thirty) days. 10/17/15   Patrici Ranks, MD  FERROUS FUMARATE PO Take 22.5 mg by mouth daily. Take 2 tablets daily    Historical Provider, MD  FOLIC ACID PO Take 1 tablet by mouth daily.    Historical Provider, MD  HYDROcodone-acetaminophen (NORCO/VICODIN) 5-325 MG tablet Take 1 tablet by mouth every 4 (four) hours as needed. Patient not taking: Reported on 03/23/2016 12/26/15   Evalee Jefferson, PA-C  ibuprofen (ADVIL,MOTRIN) 200 MG tablet Take 800 mg by mouth every 6 (six) hours as needed for mild pain or moderate pain. Reported on 03/23/2016    Historical Provider, MD  Multiple Vitamins-Minerals (MULTI COMPLETE/IRON PO) Take 2 tablets by mouth daily.     Historical Provider, MD  NON FORMULARY B12 and Iron infusion at [facility]    Historical Provider, MD  BP 111/72 mmHg  Pulse 66  Temp(Src) 98.7 F (37.1 C) (Oral)  Resp 18  Ht 5\' 5"  (1.651 m)  Wt 92.987 kg  BMI 34.11 kg/m2  SpO2 98%  LMP 04/04/2016  Breastfeeding? No Physical Exam  Constitutional: She is oriented to person, place, and time. She appears well-developed and well-nourished. No distress.  HENT:  Head: Atraumatic.  Mouth/Throat: Oropharynx is clear and moist.  Eyes: Conjunctivae and EOM are normal. Pupils are equal, round, and reactive to light. Lids are everted and swept, no foreign bodies found. Right eye exhibits no chemosis. Left eye exhibits no chemosis and no hordeolum. No foreign body present in the left eye.  Fundoscopic exam:      The left eye shows no exudate and no  papilledema.  Slit lamp exam:      The left eye shows corneal abrasion. The left eye shows no corneal flare, no corneal ulcer, no foreign body and no fluorescein uptake.  Cardiovascular: Normal rate and regular rhythm.   Pulmonary/Chest: Effort normal. No respiratory distress.  Musculoskeletal: Normal range of motion.  Neurological: She is alert and oriented to person, place, and time. Coordination normal.  Skin: Skin is warm. No rash noted.  Psychiatric: She has a normal mood and affect.  Nursing note and vitals reviewed.   ED Course  Procedures (including critical care time) Labs Review Labs Reviewed - No data to display  Imaging Review No results found. I have personally reviewed and evaluated these images and lab results as part of my medical decision-making.   EKG Interpretation None         Visual Acuity  Right Eye Distance: 20/15 Left Eye Distance: 20/13 Bilateral Distance: 20/13  Right Eye Near:   Left Eye Near:    Bilateral Near:      MDM   Final diagnoses:  Corneal abrasion, left, initial encounter    Small corneal abrasion left eye.  Ketorolac and tobramycin dispensed.   Pt agrees to close ophth f/u if needed, referral info given.      Kem Parkinson, PA-C 05/05/16 1336  Julianne Rice, MD 05/09/16 [phone]

## 2016-05-11 ENCOUNTER — Emergency Department (HOSPITAL_COMMUNITY)
Admission: EM | Admit: 2016-05-11 | Discharge: 2016-05-11 | Disposition: A | Discharge status: Home / Self Care | Payer: Medicaid Other | Attending: Emergency Medicine | Admitting: Emergency Medicine

## 2016-05-11 ENCOUNTER — Encounter (HOSPITAL_COMMUNITY): Payer: Self-pay | Admitting: *Deleted

## 2016-05-11 DIAGNOSIS — T1592XA Foreign body on external eye, part unspecified, left eye, initial encounter: Secondary | ICD-10-CM | POA: Diagnosis present

## 2016-05-11 DIAGNOSIS — Y939 Activity, unspecified: Secondary | ICD-10-CM | POA: Insufficient documentation

## 2016-05-11 DIAGNOSIS — Y999 Unspecified external cause status: Secondary | ICD-10-CM | POA: Diagnosis not present

## 2016-05-11 DIAGNOSIS — X58XXXA Exposure to other specified factors, initial encounter: Secondary | ICD-10-CM | POA: Insufficient documentation

## 2016-05-11 DIAGNOSIS — Y929 Unspecified place or not applicable: Secondary | ICD-10-CM | POA: Diagnosis not present

## 2016-05-11 DIAGNOSIS — E669 Obesity, unspecified: Secondary | ICD-10-CM | POA: Insufficient documentation

## 2016-05-11 DIAGNOSIS — S0502XA Injury of conjunctiva and corneal abrasion without foreign body, left eye, initial encounter: Secondary | ICD-10-CM | POA: Diagnosis not present

## 2016-05-11 DIAGNOSIS — F329 Major depressive disorder, single episode, unspecified: Secondary | ICD-10-CM | POA: Insufficient documentation

## 2016-05-11 MED ORDER — HYDROCODONE-ACETAMINOPHEN 5-325 MG PO TABS
2.0000 | ORAL_TABLET | Freq: Once | ORAL | Status: AC
  Administered 2016-05-11: 2 via ORAL

## 2016-05-11 MED ORDER — TETRACAINE HCL 0.5 % OP SOLN
2.0000 [drp] | Freq: Once | OPHTHALMIC | Status: DC
  Filled 2016-05-11: qty 4

## 2016-05-11 MED ORDER — HYDROCODONE-ACETAMINOPHEN 5-325 MG PO TABS
ORAL_TABLET | ORAL | Status: AC
  Filled 2016-05-11: qty 1

## 2016-05-11 MED ORDER — FLUORESCEIN SODIUM 1 MG OP STRP
1.0000 | ORAL_STRIP | Freq: Once | OPHTHALMIC | Status: DC
  Filled 2016-05-11: qty 1

## 2016-05-11 NOTE — ED Provider Notes (Signed)
CSN: [id number]     Arrival date & time 05/11/16  2013 History   First MD Initiated Contact with Patient 05/11/16 2100     Chief Complaint  Patient presents with  . Foreign Body in [place]     (Consider location/radiation/quality/duration/timing/severity/associated sxs/prior Treatment) Patient is a 40 y.o. female presenting with foreign body in eye. The history is provided by the patient. No language interpreter was used.  Foreign Body in Eye This is a new problem. The current episode started today. The problem occurs constantly. The problem has been unchanged. Pertinent negatives include no sore throat. Nothing aggravates the symptoms. She has tried nothing for the symptoms. The treatment provided no relief.  Pt reports she thinks she has sand from her bodyscrub in her eye.  Pt reports husband saw what looked like sand.  Past Medical History  Diagnosis Date  . Obesity     gastric Bypass, Roux-en Y  . Migraines   . Frozen shoulder syndrome   . Chronic knee pain   . Chronic left shoulder pain   . Chronic neck pain   . Patellofemoral syndrome, left   . Anxiety   . Chronic anemia   . Microcytic hypochromic anemia 09/12/2015  . Gastric bypass status for obesity 09/12/2015  . Vitamin B 12 deficiency 09/13/2015  . Depression    Past Surgical History  Procedure Laterality Date  . Gastric bypass  04/20/2011    [place], wt 271lb preop  . Fertility surgery  2009    No abnormalities in female   Family History  Problem Relation Age of Onset  . Diabetes Father   . Hyperlipidemia Father   . Hypertension Father   . Cancer Father     skin cancer  . Depression Mother     lifelong  . Asthma Mother   . Anesthesia problems Neg Hx    Social History  Substance Use Topics  . Smoking status: Never Smoker   . Smokeless tobacco: Never Used  . Alcohol Use: No   OB History    Gravida Para Term Preterm AB TAB SAB Ectopic Multiple Living   3 3 3  0 0 0 0 0 0 3     Review of Systems   HENT: Negative for sore throat.   All other systems reviewed and are negative.     Allergies  Morphine and related; Nsaids; Penicillins; and Tramadol  Home Medications   Prior to Admission medications   Medication Sig Start Date End Date Taking? Authorizing Provider  cyanocobalamin (,VITAMIN B-12,) 1000 MCG/ML injection Inject 1 mL (1,000 mcg total) into the skin every 30 (thirty) days. 10/17/15   Patrici Ranks, MD  FERROUS FUMARATE PO Take 22.5 mg by mouth daily. Take 2 tablets daily    Historical Provider, MD  FOLIC ACID PO Take 1 tablet by mouth daily.    Historical Provider, MD  HYDROcodone-acetaminophen (NORCO/VICODIN) 5-325 MG tablet Take 1 tablet by mouth every 4 (four) hours as needed. Patient not taking: Reported on 03/23/2016 12/26/15   Evalee Jefferson, PA-C  ibuprofen (ADVIL,MOTRIN) 200 MG tablet Take 800 mg by mouth every 6 (six) hours as needed for mild pain or moderate pain. Reported on 03/23/2016    Historical Provider, MD  Multiple Vitamins-Minerals (MULTI COMPLETE/IRON PO) Take 2 tablets by mouth daily.     Historical Provider, MD  NON FORMULARY B12 and Iron infusion at [facility]    Historical Provider, MD   BP 105/63 mmHg  Pulse 78  Temp(Src) 98.1  F (36.7 C) (Oral)  Resp 16  Ht 5\' 5"  (1.651 m)  Wt 92.987 kg  BMI 34.11 kg/m2  SpO2 100%  LMP 05/04/2016 Physical Exam  Constitutional: She is oriented to person, place, and time. She appears well-developed and well-nourished.  HENT:  Head: Normocephalic.  Eyes: EOM and lids are normal. Lids are everted and swept, no foreign bodies found. Right conjunctiva is injected.  Slit lamp exam:      The left eye shows corneal abrasion.  Injected left conjunctiva  Musculoskeletal: Normal range of motion.  Neurological: She is alert and oriented to person, place, and time.  Skin: Skin is warm.  Nursing note and vitals reviewed.   ED Course  Procedures (including critical care time) Labs Review Labs Reviewed - No data to  display  Imaging Review No results found. I have personally reviewed and evaluated these images and lab results as part of my medical decision-making.   EKG Interpretation None      MDM I irrigatted eye with saline.  No foreign body.   Final diagnoses:  Corneal abrasion, left, initial encounter    Pt advised to use eye drops from last visit. Hydrocodone 2 tablets here.    Hollace Kinnier [place], PA-C 05/11/16 [place], MD 05/15/16 [phone]

## 2016-05-11 NOTE — ED Notes (Signed)
Pt states that she feels that something is in her left eye, was seen in er last week for same eye, reports that she was given eye drops, became better but pain and irritation returned a hour and half ago,

## 2016-05-11 NOTE — Discharge Instructions (Signed)

## 2016-05-22 ENCOUNTER — Other Ambulatory Visit (HOSPITAL_COMMUNITY): Payer: Self-pay | Admitting: Family

## 2016-05-22 DIAGNOSIS — N6452 Nipple discharge: Secondary | ICD-10-CM

## 2016-05-22 DIAGNOSIS — N644 Mastodynia: Secondary | ICD-10-CM

## 2016-05-29 ENCOUNTER — Encounter (HOSPITAL_COMMUNITY): Payer: Medicaid Other

## 2016-05-29 ENCOUNTER — Ambulatory Visit (HOSPITAL_COMMUNITY)
Admission: RE | Admit: 2016-05-29 | Discharge: 2016-05-29 | Disposition: A | Discharge status: Home / Self Care | Payer: Medicaid Other | Source: Ambulatory Visit | Attending: Family | Admitting: Family

## 2016-05-29 DIAGNOSIS — N644 Mastodynia: Secondary | ICD-10-CM | POA: Insufficient documentation

## 2016-05-29 DIAGNOSIS — N6452 Nipple discharge: Secondary | ICD-10-CM | POA: Insufficient documentation

## 2016-06-13 ENCOUNTER — Emergency Department (HOSPITAL_COMMUNITY): Payer: Self-pay

## 2016-06-13 ENCOUNTER — Emergency Department (HOSPITAL_COMMUNITY)
Admission: EM | Admit: 2016-06-13 | Discharge: 2016-06-13 | Disposition: A | Discharge status: Home / Self Care | Payer: Self-pay | Attending: Emergency Medicine | Admitting: Emergency Medicine

## 2016-06-13 ENCOUNTER — Encounter (HOSPITAL_COMMUNITY): Payer: Self-pay

## 2016-06-13 DIAGNOSIS — R109 Unspecified abdominal pain: Secondary | ICD-10-CM

## 2016-06-13 DIAGNOSIS — Z79899 Other long term (current) drug therapy: Secondary | ICD-10-CM | POA: Insufficient documentation

## 2016-06-13 DIAGNOSIS — R300 Dysuria: Secondary | ICD-10-CM

## 2016-06-13 DIAGNOSIS — F329 Major depressive disorder, single episode, unspecified: Secondary | ICD-10-CM | POA: Insufficient documentation

## 2016-06-13 LAB — URINALYSIS, ROUTINE W REFLEX MICROSCOPIC
Bilirubin Urine: NEGATIVE
Glucose, UA: NEGATIVE mg/dL
Hgb urine dipstick: NEGATIVE
Ketones, ur: 15 mg/dL — AB
LEUKOCYTES UA: NEGATIVE
NITRITE: NEGATIVE
PH: 6 (ref 5.0–8.0)
Protein, ur: NEGATIVE mg/dL
SPECIFIC GRAVITY, URINE: 1.02 (ref 1.005–1.030)

## 2016-06-13 LAB — WET PREP, GENITAL
SPERM: NONE SEEN
TRICH WET PREP: NONE SEEN
YEAST WET PREP: NONE SEEN

## 2016-06-13 LAB — PREGNANCY, URINE: Preg Test, Ur: NEGATIVE

## 2016-06-13 MED ORDER — HYDROCODONE-ACETAMINOPHEN 5-325 MG PO TABS
2.0000 | ORAL_TABLET | Freq: Once | ORAL | Status: AC
  Administered 2016-06-13: 2 via ORAL
  Filled 2016-06-13: qty 2

## 2016-06-13 MED ORDER — METRONIDAZOLE 500 MG PO TABS: 500.0000 mg | ORAL_TABLET | Freq: Three times a day (TID) | ORAL | Status: DC

## 2016-06-13 MED ORDER — PHENAZOPYRIDINE HCL 200 MG PO TABS: 200.0000 mg | ORAL_TABLET | Freq: Three times a day (TID) | ORAL | Status: DC

## 2016-06-13 NOTE — ED Provider Notes (Signed)
CSN: [id number]     Arrival date & time 06/13/16  1324 History   First MD Initiated Contact with Patient 06/13/16 1409     Chief Complaint  Patient presents with  . Flank Pain     (Consider location/radiation/quality/duration/timing/severity/associated sxs/prior Treatment) HPI Comments: Patient presents with diffuse lower back pain and suprapubic pain that onset this morning upon waking up. Associated with nausea with no vomiting. Endorses urinary frequency, urgency and burning with urination. No blood in the urine. No fever. No diarrhea. Has had nausea but no vomiting. No chest pain or shortness of breath. History of UTIs in the past and this feels similar. Also states has had kidney stones last when she was pregnant. No vaginal bleeding or discharge. History of gastric bypass remotely, and uterine fibroids. She took Tylenol today without relief.  Patient is a 40 y.o. female presenting with flank pain. The history is provided by the patient.  Flank Pain Pertinent negatives include no chest pain, no abdominal pain, no headaches and no shortness of breath.    Past Medical History  Diagnosis Date  . Obesity     gastric Bypass, Roux-en Y  . Migraines   . Frozen shoulder syndrome   . Chronic knee pain   . Chronic left shoulder pain   . Chronic neck pain   . Patellofemoral syndrome, left   . Anxiety   . Chronic anemia   . Microcytic hypochromic anemia 09/12/2015  . Gastric bypass status for obesity 09/12/2015  . Vitamin B 12 deficiency 09/13/2015  . Depression    Past Surgical History  Procedure Laterality Date  . Gastric bypass  04/20/2011    [place], wt 271lb preop  . Fertility surgery  2009    No abnormalities in female   Family History  Problem Relation Age of Onset  . Diabetes Father   . Hyperlipidemia Father   . Hypertension Father   . Cancer Father     skin cancer  . Depression Mother     lifelong  . Asthma Mother   . Anesthesia problems Neg Hx    Social  History  Substance Use Topics  . Smoking status: Never Smoker   . Smokeless tobacco: Never Used  . Alcohol Use: No   OB History    Gravida Para Term Preterm AB TAB SAB Ectopic Multiple Living   3 3 3  0 0 0 0 0 0 3     Review of Systems  Constitutional: Negative for fever, activity change and appetite change.  HENT: Negative for congestion, rhinorrhea and trouble swallowing.   Respiratory: Negative for cough, chest tightness and shortness of breath.   Cardiovascular: Negative for chest pain and leg swelling.  Gastrointestinal: Positive for nausea. Negative for vomiting and abdominal pain.  Genitourinary: Positive for flank pain. Negative for vaginal bleeding and vaginal discharge.  Musculoskeletal: Positive for back pain. Negative for myalgias and arthralgias.  Skin: Negative for rash.  Neurological: Negative for dizziness, weakness and headaches.  A complete 10 system review of systems was obtained and all systems are negative except as noted in the HPI and PMH.      Allergies  Morphine and related; Nsaids; Penicillins; and Tramadol  Home Medications   Prior to Admission medications   Medication Sig Start Date End Date Taking? Authorizing Provider  cyanocobalamin (,VITAMIN B-12,) 1000 MCG/ML injection Inject 1 mL (1,000 mcg total) into the skin every 30 (thirty) days. 10/17/15   Patrici Ranks, MD  FERROUS FUMARATE PO Take  22.5 mg by mouth daily. Take 2 tablets daily    Historical Provider, MD  FOLIC ACID PO Take 1 tablet by mouth daily.    Historical Provider, MD  HYDROcodone-acetaminophen (NORCO/VICODIN) 5-325 MG tablet Take 1 tablet by mouth every 4 (four) hours as needed. Patient not taking: Reported on 03/23/2016 12/26/15   Evalee Jefferson, PA-C  ibuprofen (ADVIL,MOTRIN) 200 MG tablet Take 800 mg by mouth every 6 (six) hours as needed for mild pain or moderate pain. Reported on 03/23/2016    Historical Provider, MD  Multiple Vitamins-Minerals (MULTI COMPLETE/IRON PO) Take 2  tablets by mouth daily.     Historical Provider, MD  NON FORMULARY B12 and Iron infusion at [facility]    Historical Provider, MD   BP 108/81 mmHg  Pulse 107  Temp(Src) 97.8 F (36.6 C) (Oral)  Resp 18  Ht 5\' 5"  (1.651 m)  Wt 210 lb (95.255 kg)  BMI 34.95 kg/m2  SpO2 100%  LMP 06/03/2016 Physical Exam  Constitutional: She is oriented to person, place, and time. She appears well-developed and well-nourished. No distress.  HENT:  Head: Normocephalic and atraumatic.  Mouth/Throat: Oropharynx is clear and moist. No oropharyngeal exudate.  Eyes: Conjunctivae and EOM are normal. Pupils are equal, round, and reactive to light.  Neck: Normal range of motion. Neck supple.  No meningismus.  Cardiovascular: Normal rate, regular rhythm, normal heart sounds and intact distal pulses.   No murmur heard. Pulmonary/Chest: Effort normal and breath sounds normal. No respiratory distress.  Abdominal: Soft. There is tenderness. There is no rebound and no guarding.  Superpubic tenderness, no right lower quadrant tenderness  Genitourinary:  Chaperone present. Normal external genitalia. White discharge in vaginal vault. No CMT. No lateralizing adnexal tenderness  Musculoskeletal: Normal range of motion. She exhibits tenderness. She exhibits no edema.  Paraspinal lumbar tenderness, no midline tenderness. No CVA tenderness  Neurological: She is alert and oriented to person, place, and time. No cranial nerve deficit. She exhibits normal muscle tone. Coordination normal.  No ataxia on finger to nose bilaterally. No pronator drift. 5/5 strength throughout. CN 2-12 intact.Equal grip strength. Sensation intact.   Skin: Skin is warm.  Psychiatric: She has a normal mood and affect. Her behavior is normal.  Nursing note and vitals reviewed.   ED Course  Procedures (including critical care time) Labs Review Labs Reviewed  WET PREP, GENITAL - Abnormal; Notable for the following:    Clue Cells Wet Prep HPF POC PRESENT  (*)    WBC, Wet Prep HPF POC MODERATE (*)    All other components within normal limits  URINALYSIS, ROUTINE W REFLEX MICROSCOPIC (NOT AT [facility]) - Abnormal; Notable for the following:    Ketones, ur 15 (*)    All other components within normal limits  URINE CULTURE  PREGNANCY, URINE  GC/CHLAMYDIA PROBE AMP ([place]) NOT AT [facility]    Imaging Review Ct Renal Stone Study  06/13/2016  CLINICAL DATA:  Bilateral flank pain.  Lower abdominal pain. EXAM: CT ABDOMEN AND PELVIS WITHOUT CONTRAST TECHNIQUE: Multidetector CT imaging of the abdomen and pelvis was performed following the standard protocol without IV contrast. COMPARISON:  03/31/2015 FINDINGS: Lower chest and abdominal wall: Trace bilateral pleural fluid. Trace pericardial fluid. Hepatobiliary: No focal liver abnormality.Cholelithiasis. No evidence of cholecystitis. Pancreas: Unremarkable. Spleen: Unremarkable. Adrenals/Urinary Tract: Negative adrenals. No hydronephrosis or stone. Unremarkable collapsed bladder. Reproductive:Negative. Fibroids reported on 03/16/2016 sonography are not discretely visualized. Stomach/Bowel: Status post gastric bypass. No bowel obstruction or marginal inflammation. No appendicitis.  Vascular/Lymphatic: No acute vascular abnormality. No mass or adenopathy. Peritoneal: No ascites or pneumoperitoneum. Musculoskeletal: No acute abnormalities. 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 IMPRESSION: 1. No acute intra-abdominal finding. No hydronephrosis or urolithiasis. 2. Trace pleural and pericardial fluid. 3. Cholelithiasis. 4. 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 Electronically Signed   By: Monte Fantasia M.D.   On: 06/13/2016 15:51   I have personally reviewed and evaluated these images and lab results as part of my medical decision-making.   EKG Interpretation None      MDM   Final diagnoses:  Flank pain  Dysuria   Suprapubic pain and low back pain since this morning with nausea.  Positive dysuria, frequency and urgency  Urinalysis obtained in triage is negative for infection and negative for hematuria. Pregnancy test negative  CT scan is negative for kidney stone or other acute pathology. Patient's pain is improved. No evidence of appendicitis. She has no lateralizing adnexal pain. Patient had pelvic ultrasound done 3 months ago that showed fibroids.  Patient states that she did have sex last night but pain didn't start until this morning. No pain during sex.  Question possible passed kidney stone.  UA sent for culture.  Will treat BV.  Doubt TOA or ovarian torsion. Follow up with PCP. Return precautions discussed.  [name], MD 06/13/16 [phone]

## 2016-06-13 NOTE — ED Notes (Signed)
Patient verbalizes understanding of discharge instructions, prescriptions, home care, and follow up care. Patient out of department at this time.

## 2016-06-13 NOTE — Discharge Instructions (Signed)
Dysuria You will be called if your urine culture is positive.  The pyridium may turn your urine orange. Follow up with your doctor. Return to the ED if you develop new or worsening symptoms. Dysuria is pain or discomfort while urinating. The pain or discomfort may be felt in the tube that carries urine out of the bladder (urethra) or in the surrounding tissue of the genitals. The pain may also be felt in the groin area, lower abdomen, and lower back. You may have to urinate frequently or have the sudden feeling that you have to urinate (urgency). Dysuria can affect both men and women, but is more common in women. Dysuria can be caused by many different things, including:  Urinary tract infection in women.  Infection of the kidney or bladder.  Kidney stones or bladder stones.  Certain sexually transmitted infections (STIs), such as chlamydia.  Dehydration.  Inflammation of the vagina.  Use of certain medicines.  Use of certain soaps or scented products that cause irritation. HOME CARE INSTRUCTIONS Watch your dysuria for any changes. The following actions may help to reduce any discomfort you are feeling:  Drink enough fluid to keep your urine clear or pale yellow.  Empty your bladder often. Avoid holding urine for long periods of time.  After a bowel movement or urination, women should cleanse from front to back, using each tissue only once.  Empty your bladder after sexual intercourse.  Take medicines only as directed by your health care provider.  If you were prescribed an antibiotic medicine, finish it all even if you start to feel better.  Avoid caffeine, tea, and alcohol. They can irritate the bladder and make dysuria worse. In men, alcohol may irritate the prostate.  Keep all follow-up visits as directed by your health care provider. This is important.  If you had any tests done to find the cause of dysuria, it is your responsibility to obtain your test results. Ask the  lab or department performing the test when and how you will get your results. Talk with your health care provider if you have any questions about your results. SEEK MEDICAL CARE IF:  You develop pain in your back or sides.  You have a fever.  You have nausea or vomiting.  You have blood in your urine.  You are not urinating as often as you usually do. SEEK IMMEDIATE MEDICAL CARE IF:  You pain is severe and not relieved with medicines.  You are unable to hold down any fluids.  You or someone else notices a change in your mental function.  You have a rapid heartbeat at rest.  You have shaking or chills.  You feel extremely weak.   This information is not intended to replace advice given to you by your health care provider. Make sure you discuss any questions you have with your health care provider.   Document Released: 09/14/2004 Document Revised: 01/07/2015 Document Reviewed: 08/12/2014 Elsevier Interactive Patient Education [employer].

## 2016-06-13 NOTE — ED Notes (Signed)
Patient is resting comfortably. 

## 2016-06-13 NOTE — ED Notes (Signed)
Complain of flank pain, nausea and lower abdominal pain

## 2016-06-13 NOTE — ED Notes (Signed)
Family at bedside. 

## 2016-06-14 LAB — GC/CHLAMYDIA PROBE AMP (~~LOC~~) NOT AT ARMC
Chlamydia: NEGATIVE
NEISSERIA GONORRHEA: NEGATIVE

## 2016-06-15 LAB — URINE CULTURE

## 2016-06-27 ENCOUNTER — Other Ambulatory Visit (HOSPITAL_COMMUNITY): Payer: Self-pay | Admitting: Oncology

## 2016-06-27 DIAGNOSIS — D509 Iron deficiency anemia, unspecified: Secondary | ICD-10-CM

## 2016-06-29 ENCOUNTER — Encounter (HOSPITAL_COMMUNITY): Payer: Self-pay | Attending: Hematology & Oncology

## 2016-06-29 DIAGNOSIS — F329 Major depressive disorder, single episode, unspecified: Secondary | ICD-10-CM | POA: Insufficient documentation

## 2016-06-29 DIAGNOSIS — D509 Iron deficiency anemia, unspecified: Secondary | ICD-10-CM | POA: Insufficient documentation

## 2016-06-29 DIAGNOSIS — F419 Anxiety disorder, unspecified: Secondary | ICD-10-CM | POA: Insufficient documentation

## 2016-06-29 DIAGNOSIS — Z833 Family history of diabetes mellitus: Secondary | ICD-10-CM | POA: Insufficient documentation

## 2016-06-29 DIAGNOSIS — E538 Deficiency of other specified B group vitamins: Secondary | ICD-10-CM | POA: Insufficient documentation

## 2016-06-29 DIAGNOSIS — Z880 Allergy status to penicillin: Secondary | ICD-10-CM | POA: Insufficient documentation

## 2016-06-29 DIAGNOSIS — Z808 Family history of malignant neoplasm of other organs or systems: Secondary | ICD-10-CM | POA: Insufficient documentation

## 2016-06-29 DIAGNOSIS — G8929 Other chronic pain: Secondary | ICD-10-CM | POA: Insufficient documentation

## 2016-06-29 DIAGNOSIS — E669 Obesity, unspecified: Secondary | ICD-10-CM | POA: Insufficient documentation

## 2016-06-29 DIAGNOSIS — Z9889 Other specified postprocedural states: Secondary | ICD-10-CM | POA: Insufficient documentation

## 2016-06-29 DIAGNOSIS — Z79899 Other long term (current) drug therapy: Secondary | ICD-10-CM | POA: Insufficient documentation

## 2016-06-29 LAB — IRON AND TIBC
IRON: 111 ug/dL (ref 28–170)
Saturation Ratios: 32 % — ABNORMAL HIGH (ref 10.4–31.8)
TIBC: 349 ug/dL (ref 250–450)
UIBC: 238 ug/dL

## 2016-06-29 LAB — CBC
HCT: 38.3 % (ref 36.0–46.0)
Hemoglobin: 13 g/dL (ref 12.0–15.0)
MCH: 31.1 pg (ref 26.0–34.0)
MCHC: 33.9 g/dL (ref 30.0–36.0)
MCV: 91.6 fL (ref 78.0–100.0)
PLATELETS: 250 10*3/uL (ref 150–400)
RBC: 4.18 MIL/uL (ref 3.87–5.11)
RDW: 12.2 % (ref 11.5–15.5)
WBC: 7.5 10*3/uL (ref 4.0–10.5)

## 2016-06-29 LAB — FERRITIN: Ferritin: 41 ng/mL (ref 11–307)

## 2016-07-01 ENCOUNTER — Other Ambulatory Visit (HOSPITAL_COMMUNITY): Payer: Self-pay | Admitting: Oncology

## 2016-07-17 ENCOUNTER — Encounter (HOSPITAL_COMMUNITY): Payer: Self-pay

## 2016-07-17 ENCOUNTER — Encounter (HOSPITAL_COMMUNITY): Payer: Self-pay | Attending: Hematology & Oncology

## 2016-07-17 VITALS — BP 99/66 | HR 73 | Temp 98.5°F | Resp 18

## 2016-07-17 DIAGNOSIS — F419 Anxiety disorder, unspecified: Secondary | ICD-10-CM | POA: Insufficient documentation

## 2016-07-17 DIAGNOSIS — F329 Major depressive disorder, single episode, unspecified: Secondary | ICD-10-CM | POA: Insufficient documentation

## 2016-07-17 DIAGNOSIS — E669 Obesity, unspecified: Secondary | ICD-10-CM | POA: Insufficient documentation

## 2016-07-17 DIAGNOSIS — D509 Iron deficiency anemia, unspecified: Secondary | ICD-10-CM | POA: Insufficient documentation

## 2016-07-17 DIAGNOSIS — Z833 Family history of diabetes mellitus: Secondary | ICD-10-CM | POA: Insufficient documentation

## 2016-07-17 DIAGNOSIS — Z880 Allergy status to penicillin: Secondary | ICD-10-CM | POA: Insufficient documentation

## 2016-07-17 DIAGNOSIS — Z808 Family history of malignant neoplasm of other organs or systems: Secondary | ICD-10-CM | POA: Insufficient documentation

## 2016-07-17 DIAGNOSIS — Z9889 Other specified postprocedural states: Secondary | ICD-10-CM | POA: Insufficient documentation

## 2016-07-17 DIAGNOSIS — Z79899 Other long term (current) drug therapy: Secondary | ICD-10-CM | POA: Insufficient documentation

## 2016-07-17 DIAGNOSIS — G8929 Other chronic pain: Secondary | ICD-10-CM | POA: Insufficient documentation

## 2016-07-17 DIAGNOSIS — E538 Deficiency of other specified B group vitamins: Secondary | ICD-10-CM | POA: Insufficient documentation

## 2016-07-17 MED ORDER — SODIUM CHLORIDE 0.9 % IV SOLN
Freq: Once | INTRAVENOUS | Status: AC
  Administered 2016-07-17: 15:00:00 via INTRAVENOUS

## 2016-07-17 MED ORDER — SODIUM CHLORIDE 0.9 % IV SOLN
510.0000 mg | Freq: Once | INTRAVENOUS | Status: AC
  Administered 2016-07-17: 510 mg via INTRAVENOUS
  Filled 2016-07-17: qty 17

## 2016-07-17 MED ORDER — CYANOCOBALAMIN 1000 MCG/ML IJ SOLN: 1000.0000 ug | Freq: Once | INTRAMUSCULAR | Status: DC

## 2016-07-17 NOTE — Progress Notes (Signed)
1515:  Tolerated infusion w/o adverse reaction.  A&Ox4, in no distress.  VSS.  Discharged ambulatory.

## 2016-07-17 NOTE — Patient Instructions (Signed)
[facility] Discharge Instructions  RECOMMENDATIONS MADE BY THE CONSULTANT AND ANY TEST RESULTS WILL BE SENT TO YOUR REFERRING PHYSICIAN.  Iron infusion today. Return as scheduled for lab work and office visit.  Thank you for choosing [facility] to provide your oncology and hematology care.  To afford each patient quality time with our provider, please arrive at least 15 minutes before your scheduled appointment time.   Beginning January 23rd 2017 lab work for the Cancer Center will be done in the  Main lab at [facility] on 1st floor. If you have a lab appointment with the Cancer Center please come in thru the  Main Entrance and check in at the main information desk  You need to re-schedule your appointment should you arrive 10 or more minutes late.  We strive to give you quality time with our providers, and arriving late affects you and other patients whose appointments are after yours.  Also, if you no show three or more times for appointments you may be dismissed from the clinic at the providers discretion.     Again, thank you for choosing [facility].  Our hope is that these requests will decrease the amount of time that you wait before being seen by our physicians.       _____________________________________________________________  Should you have questions after your visit to [facility], please contact our office at [phone] between the hours of 8:30 a.m. and 4:30 p.m.  Voicemails left after 4:30 p.m. will not be returned until the following business day.  For prescription refill requests, have your pharmacy contact our office.         Resources For Cancer Patients and their Caregivers ? American Cancer Society: Can assist with transportation, wigs, general needs, runs Look Good Feel Better.        1-888-227-6333 ? Cancer Care: Provides financial assistance, online  support groups, medication/co-pay assistance.  1-800-813-HOPE (4673) ? [facility] Assists [place] cancer patients and their families through emotional , educational and financial support.  336-427-4357 ? [place] DSS Where to apply for food stamps, Medicaid and utility assistance. 336-342-1394 ? [facility]: Transportation to medical appointments. 336-347-2287 ? Social Security Administration: May apply for disability if have a Stage IV cancer. 336-342-7796 1-800-772-1213 ? [place] Aging, Disability and Transit Services: Assists with nutrition, care and transit needs. 336-349-2343  Cancer Center Support Programs: @[email or web address]@ > Cancer Support Group  2nd Tuesday of the month 1pm-2pm, Journey Room  > Creative Journey  3rd Tuesday of the month 1130am-1pm, Journey Room  > Look Good Feel Better  1st Wednesday of the month 10am-12 noon, Journey Room (Call American Cancer Society to register 1-800-395-5775)   

## 2016-09-27 ENCOUNTER — Ambulatory Visit (HOSPITAL_COMMUNITY): Payer: Medicaid Other | Admitting: Oncology

## 2016-09-27 ENCOUNTER — Other Ambulatory Visit (HOSPITAL_COMMUNITY): Payer: Medicaid Other

## 2016-09-27 NOTE — Assessment & Plan Note (Deleted)
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

## 2016-10-12 ENCOUNTER — Emergency Department (HOSPITAL_COMMUNITY)
Admission: EM | Admit: 2016-10-12 | Discharge: 2016-10-12 | Disposition: A | Discharge status: Home / Self Care | Payer: Self-pay | Attending: Emergency Medicine | Admitting: Emergency Medicine

## 2016-10-12 ENCOUNTER — Encounter (HOSPITAL_COMMUNITY): Payer: Self-pay | Admitting: Emergency Medicine

## 2016-10-12 DIAGNOSIS — Z79899 Other long term (current) drug therapy: Secondary | ICD-10-CM | POA: Insufficient documentation

## 2016-10-12 DIAGNOSIS — M436 Torticollis: Secondary | ICD-10-CM | POA: Insufficient documentation

## 2016-10-12 MED ORDER — CYCLOBENZAPRINE HCL 10 MG PO TABS
10.0000 mg | ORAL_TABLET | Freq: Once | ORAL | Status: AC
  Administered 2016-10-12: 10 mg via ORAL
  Filled 2016-10-12: qty 1

## 2016-10-12 MED ORDER — CYCLOBENZAPRINE HCL 10 MG PO TABS: 10.0000 mg | ORAL_TABLET | Freq: Three times a day (TID) | ORAL | 0 refills | Status: DC

## 2016-10-12 MED ORDER — DEXAMETHASONE 4 MG PO TABS: 4.0000 mg | ORAL_TABLET | Freq: Two times a day (BID) | ORAL | 0 refills | Status: DC

## 2016-10-12 MED ORDER — DEXAMETHASONE SODIUM PHOSPHATE 4 MG/ML IJ SOLN
8.0000 mg | Freq: Once | INTRAMUSCULAR | Status: AC
  Administered 2016-10-12: 8 mg via INTRAMUSCULAR
  Filled 2016-10-12: qty 2

## 2016-10-12 NOTE — ED Triage Notes (Signed)
Pt reports bilat shoulder tightness, states she has prior hx of frozen shoulder. Pt reports neck pain that started 2 days ago.

## 2016-10-12 NOTE — ED Provider Notes (Signed)
[facility] DEPT Provider Note   CSN: [id number] Arrival date & time: 10/12/16  1934   By signing my name below, I, Irene Pap, attest that this documentation has been prepared under the direction and in the presence of Lily Kocher, PA-C. Electronically Signed: Irene Pap, ED Scribe. 10/12/16. 8:13 PM.  History   Chief Complaint Chief Complaint  Patient presents with  . Neck Pain   The history is provided by the patient. No language interpreter was used.  Neck Pain   This is a new problem. The current episode started 2 days ago. The problem occurs constantly. The problem has been gradually worsening. The pain is associated with nothing. There has been no fever. The pain is present in the generalized neck. Quality: sharp and tight. The pain radiates to the left shoulder and right shoulder. The pain is mild. Exacerbated by: certain movements. The pain is the same all the time. Pertinent negatives include no numbness and no weakness. She has tried nothing for the symptoms.   HPI Comments: [name] is a 40 y.o. female with a hx of chronic neck pain and chronic frozen shoulder syndrome who presents to the Emergency Department complaining of gradually worsening, sharp, tight, generalized neck pain onset 2 days ago. Pt notes that her pain radiates to the bilateral shoulders. Pt reports a hx of Frozen shoulder syndrome and does not know if this pain is related. She has worsening pain with certain movements. She has not tried anything for her symptoms. She denies recent surgery, injury, fever, numbness, or weakness. Pt is allergic to Morphine, Nsaids, and Tramadol.   Past Medical History:  Diagnosis Date  . Anxiety   . Chronic anemia   . Chronic knee pain   . Chronic left shoulder pain   . Chronic neck pain   . Depression   . Frozen shoulder syndrome   . Gastric bypass status for obesity 09/12/2015  . Microcytic hypochromic anemia 09/12/2015  . Migraines   . Obesity      gastric Bypass, Roux-en Y  . Patellofemoral syndrome, left   . Vitamin B 12 deficiency 09/13/2015    Patient Active Problem List   Diagnosis Date Noted  . Submucous leiomyoma of uterus 04/03/2016  . Vitamin B 12 deficiency 09/13/2015  . Microcytic hypochromic anemia 09/12/2015  . Gastric bypass status for obesity 09/12/2015  . Stiffness of joint, not elsewhere classified, pelvic region and thigh 06/17/2014  . Left anterior knee pain 06/03/2014  . Knee pain, left 04/02/2014  . Shoulder pain 11/18/2012  . Pain in joint, shoulder region 09/03/2012  . Muscle weakness (generalized) 09/03/2012  . 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 Pregnancy complicated by previous gastric bypass, antepartum 10/05/2011    Class: Present on Admission  . Obesity     Past Surgical History:  Procedure Laterality Date  . FERTILITY SURGERY  2009   No abnormalities in female  . GASTRIC BYPASS  04/20/2011   [place], wt 271lb preop    OB History    Gravida Para Term Preterm AB Living   3 3 3  0 0 3   SAB TAB Ectopic Multiple Live Births   0 0 0 0 3       Home Medications    Prior to Admission medications   Medication Sig Start Date End Date Taking? Authorizing Provider  cyanocobalamin (,VITAMIN B-12,) 1000 MCG/ML injection Inject 1 mL (1,000 mcg total) into the skin every 30 (thirty) days. 10/17/15   Patrici Ranks,  MD  FERROUS FUMARATE PO Take 22.5 mg by mouth daily. Take 2 tablets daily    Historical Provider, MD  FOLIC ACID PO Take 1 tablet by mouth daily.    Historical Provider, MD  HYDROcodone-acetaminophen (NORCO/VICODIN) 5-325 MG tablet Take 1 tablet by mouth every 4 (four) hours as needed. Patient not taking: Reported on 03/23/2016 12/26/15   Evalee Jefferson, PA-C  metroNIDAZOLE (FLAGYL) 500 MG tablet Take 1 tablet (500 mg total) by mouth 3 (three) times daily. 06/13/16   [name], MD  Multiple Vitamins-Minerals (MULTI COMPLETE/IRON PO) Take 2 tablets by mouth daily.      Historical Provider, MD  NON FORMULARY B12 and Iron infusion at [facility]    Historical Provider, MD  phenazopyridine (PYRIDIUM) 200 MG tablet Take 1 tablet (200 mg total) by mouth 3 (three) times daily. 06/13/16   [name], MD    Family History Family History  Problem Relation Age of Onset  . Diabetes Father   . Hyperlipidemia Father   . Hypertension Father   . Cancer Father     skin cancer  . Depression Mother     lifelong  . Asthma Mother   . Anesthesia problems Neg Hx     Social History Social History  Substance Use Topics  . Smoking status: Never Smoker  . Smokeless tobacco: Never Used  . Alcohol use No   Allergies   Morphine and related; Nsaids; Penicillins; and Tramadol  Review of Systems Review of Systems  Musculoskeletal: Positive for neck pain.  Neurological: Negative for weakness and numbness.  All other systems reviewed and are negative.  Physical Exam Updated Vital Signs BP 115/68   Pulse 75   Temp 98.6 F (37 C)   Resp 20   Ht 5\' 5"  (1.651 m)   Wt 215 lb (97.5 kg)   LMP 10/09/2016   SpO2 95%   BMI 35.78 kg/m   Physical Exam  Constitutional: She is oriented to person, place, and time. She appears well-developed and well-nourished. No distress.  HENT:  Head: Normocephalic and atraumatic.  Mouth/Throat: Oropharynx is clear and moist. No oropharyngeal exudate.  Eyes: Conjunctivae and EOM are normal. Pupils are equal, round, and reactive to light.  Neck: Normal range of motion. Neck supple. Carotid bruit is not present.  Trachea midline; There is no palpable cervical lymphadenopathy  Musculoskeletal: Normal range of motion.  No palpable step-offs of the C-Spine; decreased ROM due to pain; tightness and tenseness of the right, greater than left, trapezius  Neurological: She is alert and oriented to person, place, and time. She has normal strength. No sensory deficit.  No gross motor or sensory deficits; grip and strength is symmetrical  Skin: Skin  is warm and dry.  Psychiatric: She has a normal mood and affect. Her behavior is normal.  Nursing note and vitals reviewed.  ED Treatments / Results  DIAGNOSTIC STUDIES: Oxygen Saturation is 95% on RA, normal by my interpretation.    COORDINATION OF CARE: 8:10 PM-Discussed treatment plan which includes Flexeril and Decadron, heat compresses, and orthopedic referral to Dr. [name] with pt at bedside and pt agreed to plan. Return precautions discussed.    Labs (all labs ordered are listed, but only abnormal results are displayed) Labs Reviewed - No data to display  EKG  EKG Interpretation None       Radiology No results found.  Procedures Procedures (including critical care time)  Medications Ordered in ED Medications  cyclobenzaprine (FLEXERIL) tablet 10 mg (not administered)  dexamethasone (DECADRON) injection 8 mg (not administered)     Initial Impression / Assessment and Plan / ED Course  I have reviewed the triage vital signs and the nursing notes.  Pertinent labs & imaging results that were available during my care of the patient were reviewed by me and considered in my medical decision making (see chart for details).  Clinical Course    **I have reviewed nursing notes, vital signs, and all appropriate lab and imaging results for this patient.*  Final Clinical Impressions(s) / ED Diagnoses   Final diagnoses:  None  **I personally performed the services described in this documentation, which was scribed in my presence. The recorded information has been reviewed and is accurate.*  New Prescriptions Discharge Medication List as of 10/12/2016  8:14 PM    START taking these medications   Details  cyclobenzaprine (FLEXERIL) 10 MG tablet Take 1 tablet (10 mg total) by mouth 3 (three) times daily., Starting Fri 10/12/2016, Print    dexamethasone (DECADRON) 4 MG tablet Take 1 tablet (4 mg total) by mouth 2 (two) times daily with a meal., Starting Fri 10/12/2016,  Print         Lily Kocher, PA-C 10/14/16 [place], DO 10/16/16 [id number]

## 2016-10-12 NOTE — Discharge Instructions (Signed)
.   Use Flexeril 3 times daily. This medication may cause drowsiness, please do not drink alcohol, drive a vehicle, operating machinery, or participate in activities requiring concentration when taking this medication. Use Decadron 2 times daily with food until all taken. Please see Dr. [name] for orthopedic evaluation if not improving with the prescribed medications.

## 2016-10-29 ENCOUNTER — Encounter (HOSPITAL_COMMUNITY): Payer: Self-pay | Attending: Hematology & Oncology

## 2016-10-29 DIAGNOSIS — D509 Iron deficiency anemia, unspecified: Secondary | ICD-10-CM | POA: Insufficient documentation

## 2016-10-29 DIAGNOSIS — E538 Deficiency of other specified B group vitamins: Secondary | ICD-10-CM | POA: Insufficient documentation

## 2016-10-29 LAB — COMPREHENSIVE METABOLIC PANEL
ALBUMIN: 3.5 g/dL (ref 3.5–5.0)
ALK PHOS: 68 U/L (ref 38–126)
ALT: 22 U/L (ref 14–54)
AST: 22 U/L (ref 15–41)
Anion gap: 5 (ref 5–15)
BILIRUBIN TOTAL: 0.5 mg/dL (ref 0.3–1.2)
BUN: 10 mg/dL (ref 6–20)
CALCIUM: 8.3 mg/dL — AB (ref 8.9–10.3)
CO2: 23 mmol/L (ref 22–32)
CREATININE: 0.53 mg/dL (ref 0.44–1.00)
Chloride: 106 mmol/L (ref 101–111)
GFR calc Af Amer: >60 mL/min (ref >60)
GFR calc non Af Amer: >60 mL/min (ref >60)
GLUCOSE: 100 mg/dL — AB (ref 65–99)
Potassium: 3.5 mmol/L (ref 3.5–5.1)
Sodium: 134 mmol/L — ABNORMAL LOW (ref 135–145)
TOTAL PROTEIN: 6.7 g/dL (ref 6.5–8.1)

## 2016-10-29 LAB — CBC WITH DIFFERENTIAL/PLATELET
BASOS ABS: 0 10*3/uL (ref 0.0–0.1)
BASOS PCT: 0 %
Eosinophils Absolute: 0.4 10*3/uL (ref 0.0–0.7)
Eosinophils Relative: 3 %
HEMATOCRIT: 39.9 % (ref 36.0–46.0)
HEMOGLOBIN: 13.4 g/dL (ref 12.0–15.0)
LYMPHS PCT: 28 %
Lymphs Abs: 3 10*3/uL (ref 0.7–4.0)
MCH: 31.1 pg (ref 26.0–34.0)
MCHC: 33.6 g/dL (ref 30.0–36.0)
MCV: 92.6 fL (ref 78.0–100.0)
MONOS PCT: 5 %
Monocytes Absolute: 0.6 10*3/uL (ref 0.1–1.0)
NEUTROS ABS: 6.7 10*3/uL (ref 1.7–7.7)
NEUTROS PCT: 64 %
Platelets: 272 10*3/uL (ref 150–400)
RBC: 4.31 MIL/uL (ref 3.87–5.11)
RDW: 12.5 % (ref 11.5–15.5)
WBC: 10.6 10*3/uL — ABNORMAL HIGH (ref 4.0–10.5)

## 2016-10-29 LAB — VITAMIN B12: Vitamin B-12: 227 pg/mL (ref 180–914)

## 2016-10-29 LAB — FOLATE: Folate: 17.3 ng/mL (ref >5.9)

## 2016-10-29 LAB — FERRITIN: Ferritin: 76 ng/mL (ref 11–307)

## 2016-11-14 ENCOUNTER — Encounter (HOSPITAL_COMMUNITY): Payer: Self-pay | Attending: Oncology | Admitting: Oncology

## 2016-11-14 ENCOUNTER — Encounter (HOSPITAL_COMMUNITY): Payer: Self-pay | Admitting: Oncology

## 2016-11-14 VITALS — BP 113/80 | HR 92 | Temp 97.8°F | Resp 16 | Ht 65.0 in | Wt 222.0 lb

## 2016-11-14 DIAGNOSIS — D508 Other iron deficiency anemias: Secondary | ICD-10-CM | POA: Insufficient documentation

## 2016-11-14 DIAGNOSIS — E538 Deficiency of other specified B group vitamins: Secondary | ICD-10-CM | POA: Insufficient documentation

## 2016-11-14 DIAGNOSIS — Z5189 Encounter for other specified aftercare: Secondary | ICD-10-CM | POA: Insufficient documentation

## 2016-11-14 DIAGNOSIS — Z23 Encounter for immunization: Secondary | ICD-10-CM

## 2016-11-14 MED ORDER — INFLUENZA VAC SPLIT QUAD 0.5 ML IM SUSY
0.5000 mL | PREFILLED_SYRINGE | Freq: Once | INTRAMUSCULAR | Status: AC
  Administered 2016-11-14: 0.5 mL via INTRAMUSCULAR
  Filled 2016-11-14: qty 0.5

## 2016-11-14 NOTE — Assessment & Plan Note (Signed)
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  On IM B12 monthly administered at home.  Labs on 10/29/2016: B12.  I personally reviewed and went over laboratory results with the patient.  The results are noted within this dictation.  B12 is low-normal.  She is encouraged to continue with monthly B12 and compliance is encouraged.  She reports compliance.  Labs in 2 months: B12  Labs in 6 months: B12

## 2016-11-14 NOTE — Assessment & Plan Note (Addendum)
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  Labs performed on 10/29/2016: CBC diff, CMET, iron/TIBC, ferritin, B12.  I personally reviewed and went over laboratory results with the patient.  The results are noted within this dictation.  Calculated iron deficit is ~ 125 mg.  Given the she does not have renal insufficiency, she is not a candidate for ferric gluconate.  She is asymptomatic with a ferritin of 76.    She reports blood in her stool.  She will be sent home with stool cards x 3.  If positive, she will be referred to GI for evaluation and management.  Labs in 2 months: CBC, iron/TIBC, ferritin.    Short interval blood check is recommended given her blood in stool and borderline iron deficiency.  Based upon these labs, she may be a candidate for IV feraheme.  Labs in 6 months: CBC diff, renal function panel, anemia panel, copper level, Vit D level.  Return in 6 months for follow-up.

## 2016-11-14 NOTE — Progress Notes (Signed)
[employer] [id number] [place] [address]  Other iron deficiency anemia - Plan: CBC with Differential, Iron and TIBC, Ferritin, Occult blood card to lab, stool, Occult blood card to lab, stool, Occult blood card to lab, stool, CBC with Differential, Renal function panel, Vitamin B12, Folate, Iron and TIBC, Ferritin, Vitamin D 25 hydroxy, Copper, serum  Need for prophylactic vaccination and inoculation against influenza  Vitamin B 12 deficiency - Plan: Vitamin B12  CURRENT THERAPY: IV iron replacement when indicated and IM B12 therapy at home monthly.  INTERVAL HISTORY: [name] 40 y.o. female returns for followup of iron deficiency anemia secondary to iron malabsorption from Roux-en-Y procedure in 2012 in [place] deficiency secondary to malabsorption from gastric bypass, on IM B12 replacement therapy at home.  She is doing well.  She has a lot of stress at home with end of school semester requiring 3 essays and 1 final upcoming.  She also has a 12 year old child at home.   She does not blood in her stool.  She denies any blood tinged water in toilet.  She denies any blood on toilet paper.  She reports that blood is incorporated into her stool.  She reports it occurring with straining or "hard stools."  Review of Systems  Constitutional: Positive for malaise/fatigue. Negative for chills, fever and weight loss.  HENT: Negative.   Eyes: Negative.   Respiratory: Negative.  Negative for cough.   Cardiovascular: Negative.  Negative for chest pain.  Gastrointestinal: Positive for blood in stool. Negative for melena.  Genitourinary: Negative.  Negative for hematuria.  Skin: Negative.   Neurological: Negative.  Negative for weakness.  Endo/Heme/Allergies: Negative.  Does not bruise/bleed easily.  Psychiatric/Behavioral: Negative.     Past Medical History:  Diagnosis Date  . Anxiety   . Chronic anemia   . Chronic knee pain   . Chronic left  shoulder pain   . Chronic neck pain   . Depression   . Frozen shoulder syndrome   . Gastric bypass status for obesity 09/12/2015  . Iron deficiency anemia 09/12/2015  . Microcytic hypochromic anemia 09/12/2015  . Migraines   . Obesity    gastric Bypass, Roux-en Y  . Patellofemoral syndrome, left   . Vitamin B 12 deficiency 09/13/2015    Past Surgical History:  Procedure Laterality Date  . FERTILITY SURGERY  2009   No abnormalities in female  . GASTRIC BYPASS  04/20/2011   [place], wt 271lb preop    Family History  Problem Relation Age of Onset  . Diabetes Father   . Hyperlipidemia Father   . Hypertension Father   . Cancer Father     skin cancer  . Depression Mother     lifelong  . Asthma Mother   . Anesthesia problems Neg Hx     Social History   Social History  . Marital status: Married    Spouse name: N/A  . Number of children: 2  . Years of education: 13   Occupational History  . 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 Smoking status: Never Smoker  . Smokeless tobacco: Never Used  . Alcohol use No  . Drug use: No  . Sexual activity: Yes    Partners: Male     Comment: single possible conception date July 22, 2011   Other Topics Concern  .  None   Social History Narrative  . None     PHYSICAL EXAMINATION  ECOG PERFORMANCE STATUS: 1 - Symptomatic but completely ambulatory  Vitals:   11/14/16 1516  BP: 113/80  Pulse: 92  Resp: 16  Temp: 97.8 F (36.6 C)    GENERAL:alert, no distress, well nourished, well developed, comfortable, cooperative, obese, smiling and unaccompanied SKIN: skin color, texture, turgor are normal, no rashes or significant lesions HEAD: Normocephalic, No masses, lesions, tenderness or abnormalities EYES: normal, EOMI, Conjunctiva are pink and non-injected EARS: External ears normal OROPHARYNX:lips, buccal mucosa, and tongue normal, mucous membranes are moist and  gingival inflammation/edema  NECK: supple, trachea midline LYMPH:  not examined BREAST:not examined LUNGS: clear to auscultation  HEART: regular rate & rhythm ABDOMEN:abdomen soft, non-tender, obese and normal bowel sounds BACK: Back symmetric, no curvature. EXTREMITIES:less then 2 second capillary refill, no joint deformities, effusion, or inflammation, no skin discoloration, no cyanosis  NEURO: alert & oriented x 3 with fluent speech, no focal motor/sensory deficits, gait normal   LABORATORY DATA: CBC    Component Value Date/Time   WBC 10.6 (H) 10/29/2016 1447   RBC 4.31 10/29/2016 1447   HGB 13.4 10/29/2016 1447   HCT 39.9 10/29/2016 1447   PLT 272 10/29/2016 1447   MCV 92.6 10/29/2016 1447   MCH 31.1 10/29/2016 1447   MCHC 33.6 10/29/2016 1447   RDW 12.5 10/29/2016 1447   LYMPHSABS 3.0 10/29/2016 1447   MONOABS 0.6 10/29/2016 1447   EOSABS 0.4 10/29/2016 1447   BASOSABS 0.0 10/29/2016 1447      Chemistry      Component Value Date/Time   NA 134 (L) 10/29/2016 1447   K 3.5 10/29/2016 1447   CL 106 10/29/2016 1447   CO2 23 10/29/2016 1447   BUN 10 10/29/2016 1447   CREATININE 0.53 10/29/2016 1447   CREATININE 0.60 04/20/2014 1250      Component Value Date/Time   CALCIUM 8.3 (L) 10/29/2016 1447   ALKPHOS 68 10/29/2016 1447   AST 22 10/29/2016 1447   ALT 22 10/29/2016 1447   BILITOT 0.5 10/29/2016 1447     Lab Results  Component Value Date   IRON 111 06/29/2016   TIBC 349 06/29/2016   FERRITIN 76 10/29/2016   Lab Results  Component Value Date   [id number] 10/29/2016   Lab Results  Component Value Date   FOLATE 17.3 10/29/2016    PENDING LABS:   RADIOGRAPHIC STUDIES:  No results found.   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  Labs performed on 10/29/2016: CBC diff, CMET, iron/TIBC, ferritin, B12.  I personally reviewed  and went over laboratory results with the patient.  The results are noted within this dictation.  Calculated iron deficit is ~ 125 mg.  Given the she does not have renal insufficiency, she is not a candidate for ferric gluconate.  She is asymptomatic with a ferritin of 76.    She reports blood in her stool.  She will be sent home with stool cards x 3.  If positive, she will be referred to GI for evaluation and management.  Labs in 2 months: CBC, iron/TIBC, ferritin.    Short interval blood check is recommended given her blood in stool and borderline iron deficiency.  Based upon these labs, she may be a candidate for IV feraheme.  Labs in 6 months: CBC diff, renal function  panel, anemia panel, copper level, Vit D level.  Return in 6 months for follow-up.  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  On IM B12 monthly administered at home.  Labs on 10/29/2016: B12.  I personally reviewed and went over laboratory results with the patient.  The results are noted within this dictation.  B12 is low-normal.  She is encouraged to continue with monthly B12 and compliance is encouraged.  She reports compliance.  Labs in 2 months: B12  Labs in 6 months: B12    ORDERS PLACED FOR THIS ENCOUNTER: Orders Placed This Encounter  Procedures  . CBC with Differential  . Iron and TIBC  . Ferritin  . Occult blood card to lab, stool  . Occult blood card to lab, stool  . Occult blood card to lab, stool  . CBC with Differential  . Renal function panel  . Vitamin B12  . Folate  . Iron and TIBC  . Ferritin  . Vitamin D 25 hydroxy  . Copper, serum  . Vitamin B12    MEDICATIONS PRESCRIBED THIS ENCOUNTER: Meds ordered this encounter  Medications  . Influenza vac split quadrivalent PF (FLUARIX) injection 0.5 mL    THERAPY PLAN:  Ongoing monitoring of iron studies and other vitamins with IV iron replacement therapy when indicated.  All questions were  answered. The patient knows to call the clinic with any problems, questions or concerns. We can certainly see the patient much sooner if necessary.  Patient and plan discussed with Dr. Ancil Linsey and she is in agreement with the aforementioned.   This note is electronically signed by: Doy Mince 11/14/2016 7:31 PM

## 2016-11-14 NOTE — Patient Instructions (Signed)
[facility] at [facility] Discharge Instructions  RECOMMENDATIONS MADE BY THE CONSULTANT AND ANY TEST RESULTS WILL BE SENT TO YOUR REFERRING PHYSICIAN.  Due to your blood in stool, we request that you perform 3 stool cards on 3 separate bowel movements.  When completed, please bring cards to the clinic so we can test for blood. You are not a candidate for IV iron at this time. We will repeat labs in 8 weeks, at which time we will re-evaluate for the need for IV iron. Continue with B12 injections at home monthly. Labs in 6 months. Return in 6 months for follow-up.  Thank you for choosing [facility] at [facility] to provide your oncology and hematology care.  To afford each patient quality time with our provider, please arrive at least 15 minutes before your scheduled appointment time.   Beginning January 23rd 2017 lab work for the [facility] will be done in the  Main lab at [facility] on 1st floor. If you have a lab appointment with the [facility] please come in thru the  Main Entrance and check in at the main information desk  You need to re-schedule your appointment should you arrive 10 or more minutes late.  We strive to give you quality time with our providers, and arriving late affects you and other patients whose appointments are after yours.  Also, if you no show three or more times for appointments you may be dismissed from the clinic at the providers discretion.     Again, thank you for choosing [facility].  Our hope is that these requests will decrease the amount of time that you wait before being seen by our physicians.       _____________________________________________________________  Should you have questions after your visit to [facility], please contact our office at [phone] between the hours of 8:30 a.m. and 4:30 p.m.  Voicemails left after 4:30 p.m. will not be returned until the following  business day.  For prescription refill requests, have your pharmacy contact our office.         Resources For Cancer Patients and their Caregivers ? American Cancer Society: Can assist with transportation, wigs, general needs, runs Look Good Feel Better.        938-367-5919 ? Cancer Care: Provides financial assistance, online support groups, medication/co-pay assistance.  1-800-813-HOPE 709-221-7011) ? [facility] Assists [place] cancer patients and their families through emotional , educational and financial support.  760 189 1066 ? [place] DSS Where to apply for food stamps, Medicaid and utility assistance. (551) 604-2423 ? [facility]: Transportation to medical appointments. 602-468-1918 ? Social Security Administration: May apply for disability if have a Stage IV cancer. 912-057-5166 251-142-4066 ? [employer], Disability and Transit Services: Assists with nutrition, care and transit needs. [place] Support Programs: @10RELATIVEDAYS @ > Cancer Support Group  2nd Tuesday of the month 1pm-2pm, Journey Room  > Creative Journey  3rd Tuesday of the month 1130am-1pm, Journey Room  > Look Good Feel Better  1st Wednesday of the month 10am-12 noon, Journey Room (Call Vivian to register [phone])

## 2016-11-16 ENCOUNTER — Other Ambulatory Visit (HOSPITAL_COMMUNITY): Payer: Self-pay | Admitting: *Deleted

## 2016-11-16 DIAGNOSIS — D508 Other iron deficiency anemias: Secondary | ICD-10-CM

## 2016-11-16 LAB — OCCULT BLOOD X 1 CARD TO LAB, STOOL
FECAL OCCULT BLD: NEGATIVE
Fecal Occult Bld: NEGATIVE
Fecal Occult Bld: NEGATIVE

## 2017-01-14 ENCOUNTER — Encounter (HOSPITAL_COMMUNITY): Payer: Self-pay | Attending: Oncology

## 2017-01-14 DIAGNOSIS — E538 Deficiency of other specified B group vitamins: Secondary | ICD-10-CM | POA: Insufficient documentation

## 2017-01-14 DIAGNOSIS — Z5189 Encounter for other specified aftercare: Secondary | ICD-10-CM | POA: Insufficient documentation

## 2017-01-14 DIAGNOSIS — D508 Other iron deficiency anemias: Secondary | ICD-10-CM | POA: Insufficient documentation

## 2017-01-14 LAB — CBC WITH DIFFERENTIAL/PLATELET
BASOS PCT: 0 %
Basophils Absolute: 0 10*3/uL (ref 0.0–0.1)
Eosinophils Absolute: 0.4 10*3/uL (ref 0.0–0.7)
Eosinophils Relative: 4 %
HEMATOCRIT: 38.1 % (ref 36.0–46.0)
HEMOGLOBIN: 13.1 g/dL (ref 12.0–15.0)
LYMPHS ABS: 2.5 10*3/uL (ref 0.7–4.0)
LYMPHS PCT: 25 %
MCH: 31 pg (ref 26.0–34.0)
MCHC: 34.4 g/dL (ref 30.0–36.0)
MCV: 90.1 fL (ref 78.0–100.0)
MONOS PCT: 5 %
Monocytes Absolute: 0.5 10*3/uL (ref 0.1–1.0)
NEUTROS ABS: 6.7 10*3/uL (ref 1.7–7.7)
Neutrophils Relative %: 66 %
Platelets: 289 10*3/uL (ref 150–400)
RBC: 4.23 MIL/uL (ref 3.87–5.11)
RDW: 11.9 % (ref 11.5–15.5)
WBC: 10 10*3/uL (ref 4.0–10.5)

## 2017-01-15 ENCOUNTER — Other Ambulatory Visit (HOSPITAL_COMMUNITY): Payer: Self-pay | Admitting: Oncology

## 2017-01-15 LAB — FERRITIN: Ferritin: 19 ng/mL (ref 11–307)

## 2017-01-15 LAB — IRON AND TIBC
IRON: 54 ug/dL (ref 28–170)
Saturation Ratios: 13 % (ref 10.4–31.8)
TIBC: 409 ug/dL (ref 250–450)
UIBC: 355 ug/dL

## 2017-01-24 ENCOUNTER — Other Ambulatory Visit (HOSPITAL_COMMUNITY): Payer: Self-pay | Admitting: Oncology

## 2017-01-24 ENCOUNTER — Ambulatory Visit (HOSPITAL_COMMUNITY): Payer: Self-pay

## 2017-01-25 ENCOUNTER — Encounter (HOSPITAL_BASED_OUTPATIENT_CLINIC_OR_DEPARTMENT_OTHER): Payer: Self-pay

## 2017-01-25 ENCOUNTER — Encounter (HOSPITAL_COMMUNITY): Payer: Self-pay

## 2017-01-25 VITALS — BP 106/61 | HR 68 | Temp 98.0°F | Resp 18

## 2017-01-25 DIAGNOSIS — D509 Iron deficiency anemia, unspecified: Secondary | ICD-10-CM

## 2017-01-25 DIAGNOSIS — E538 Deficiency of other specified B group vitamins: Secondary | ICD-10-CM

## 2017-01-25 MED ORDER — SODIUM CHLORIDE 0.9 % IV SOLN
510.0000 mg | Freq: Once | INTRAVENOUS | Status: AC
  Administered 2017-01-25: 510 mg via INTRAVENOUS
  Filled 2017-01-25: qty 17

## 2017-01-25 MED ORDER — SODIUM CHLORIDE 0.9 % IV SOLN
Freq: Once | INTRAVENOUS | Status: AC
  Administered 2017-01-25: 15:00:00 via INTRAVENOUS

## 2017-01-25 NOTE — Progress Notes (Signed)
Patient tolerated infusion well.  VSS.  Patient ambulatory and stable upon discharge from clinic.   

## 2017-01-25 NOTE — Patient Instructions (Signed)
[facility] at [facility] Discharge Instructions  RECOMMENDATIONS MADE BY THE CONSULTANT AND ANY TEST RESULTS WILL BE SENT TO YOUR REFERRING PHYSICIAN.  IV iron today.    Thank you for choosing [facility] at [facility] to provide your oncology and hematology care.  To afford each patient quality time with our provider, please arrive at least 15 minutes before your scheduled appointment time.    If you have a lab appointment with the Cancer Center please come in thru the  Main Entrance and check in at the main information desk  You need to re-schedule your appointment should you arrive 10 or more minutes late.  We strive to give you quality time with our providers, and arriving late affects you and other patients whose appointments are after yours.  Also, if you no show three or more times for appointments you may be dismissed from the clinic at the providers discretion.     Again, thank you for choosing [facility].  Our hope is that these requests will decrease the amount of time that you wait before being seen by our physicians.       _____________________________________________________________  Should you have questions after your visit to [facility], please contact our office at [phone] between the hours of 8:30 a.m. and 4:30 p.m.  Voicemails left after 4:30 p.m. will not be returned until the following business day.  For prescription refill requests, have your pharmacy contact our office.       Resources For Cancer Patients and their Caregivers ? American Cancer Society: Can assist with transportation, wigs, general needs, runs Look Good Feel Better.        1-888-227-6333 ? Cancer Care: Provides financial assistance, online support groups, medication/co-pay assistance.  1-800-813-HOPE (4673) ? [facility] Assists [place] cancer patients and their families through emotional  , educational and financial support.  336-427-4357 ? [place] DSS Where to apply for food stamps, Medicaid and utility assistance. 336-342-1394 ? RCATS: Transportation to medical appointments. 336-347-2287 ? Social Security Administration: May apply for disability if have a Stage IV cancer. 336-342-7796 1-800-772-1213 ? [place] [facility], Disability and Transit Services: Assists with nutrition, care and transit needs. 336-349-2343  Cancer Center Support Programs: @10RELATIVEDAYS@ > Cancer Support Group  2nd Tuesday of the month 1pm-2pm, Journey Room  > Creative Journey  3rd Tuesday of the month 1130am-1pm, Journey Room  > Look Good Feel Better  1st Wednesday of the month 10am-12 noon, Journey Room (Call American Cancer Society to register 1-800-395-5775)    

## 2017-02-11 ENCOUNTER — Emergency Department (HOSPITAL_COMMUNITY): Payer: Self-pay

## 2017-02-11 ENCOUNTER — Encounter (HOSPITAL_COMMUNITY): Payer: Self-pay | Admitting: Cardiology

## 2017-02-11 ENCOUNTER — Emergency Department (HOSPITAL_COMMUNITY)
Admission: EM | Admit: 2017-02-11 | Discharge: 2017-02-11 | Disposition: A | Discharge status: Home / Self Care | Payer: Self-pay | Attending: Emergency Medicine | Admitting: Emergency Medicine

## 2017-02-11 DIAGNOSIS — Y999 Unspecified external cause status: Secondary | ICD-10-CM | POA: Insufficient documentation

## 2017-02-11 DIAGNOSIS — R6884 Jaw pain: Secondary | ICD-10-CM | POA: Insufficient documentation

## 2017-02-11 DIAGNOSIS — Y939 Activity, unspecified: Secondary | ICD-10-CM | POA: Insufficient documentation

## 2017-02-11 DIAGNOSIS — Z79899 Other long term (current) drug therapy: Secondary | ICD-10-CM | POA: Insufficient documentation

## 2017-02-11 DIAGNOSIS — Y929 Unspecified place or not applicable: Secondary | ICD-10-CM | POA: Insufficient documentation

## 2017-02-11 DIAGNOSIS — J209 Acute bronchitis, unspecified: Secondary | ICD-10-CM | POA: Insufficient documentation

## 2017-02-11 MED ORDER — BENZONATATE 100 MG PO CAPS: 100.0000 mg | ORAL_CAPSULE | Freq: Three times a day (TID) | ORAL | 0 refills | Status: DC | PRN
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
## 2017-02-11 NOTE — ED Triage Notes (Signed)
Productive cough times one week.

## 2017-02-11 NOTE — Discharge Instructions (Addendum)
Try cough medicine and continue tea/ honey as needed. Follow up with police and women's safety options as discussed.  If you were given medicines take as directed.  If you are on coumadin or contraceptives realize their levels and effectiveness is altered by many different medicines.  If you have any reaction (rash, tongues swelling, other) to the medicines stop taking and see a physician.    If your blood pressure was elevated in the ER make sure you follow up for management with a primary doctor or return for chest pain, shortness of breath or stroke symptoms.  Please follow up as directed and return to the ER or see a physician for new or worsening symptoms.  Thank you. Vitals:   02/11/17 0813  BP: 121/86  Pulse: 82  Resp: 18  Temp: 98.1 F (36.7 C)  TempSrc: Oral  SpO2: 96%  Weight: 220 lb (99.8 kg)  Height: 5\' 5"  (1.651 m)

## 2017-04-26 ENCOUNTER — Ambulatory Visit (INDEPENDENT_AMBULATORY_CARE_PROVIDER_SITE_OTHER): Payer: Medicaid Other | Admitting: Family Medicine

## 2017-04-26 ENCOUNTER — Encounter: Payer: Self-pay | Admitting: Family Medicine

## 2017-04-26 VITALS — BP 110/62 | HR 72 | Temp 97.7°F | Resp 16 | Ht 65.5 in | Wt 204.4 lb

## 2017-04-26 DIAGNOSIS — F4310 Post-traumatic stress disorder, unspecified: Secondary | ICD-10-CM | POA: Insufficient documentation

## 2017-04-26 DIAGNOSIS — Z7689 Persons encountering health services in other specified circumstances: Secondary | ICD-10-CM

## 2017-04-26 MED ORDER — ALPRAZOLAM 0.5 MG PO TABS: 0.5000 mg | ORAL_TABLET | Freq: Two times a day (BID) | ORAL | 0 refills | Status: DC | PRN

## 2017-04-26 MED ORDER — FLUOXETINE HCL 20 MG PO TABS: 20.0000 mg | ORAL_TABLET | Freq: Every day | ORAL | 5 refills | Status: AC

## 2017-04-26 NOTE — Patient Instructions (Signed)
Take the fluoxetine once a day in the morning Take the xanax as needed Walk every day that you are able  See me in one month  Call sooner for side effects or problems
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
## 2017-05-01 ENCOUNTER — Telehealth: Payer: Self-pay | Admitting: Family Medicine

## 2017-05-01 NOTE — Telephone Encounter (Signed)
Left message for patient to call and schedule 1 MONTH F/U.

## 2017-05-14 ENCOUNTER — Ambulatory Visit (HOSPITAL_COMMUNITY): Payer: Self-pay | Admitting: Oncology

## 2017-05-14 ENCOUNTER — Encounter (HOSPITAL_COMMUNITY): Payer: Medicaid Other | Attending: Oncology

## 2017-05-14 DIAGNOSIS — D508 Other iron deficiency anemias: Secondary | ICD-10-CM | POA: Insufficient documentation

## 2017-05-14 LAB — CBC WITH DIFFERENTIAL/PLATELET
BASOS PCT: 0 %
Basophils Absolute: 0 10*3/uL (ref 0.0–0.1)
Eosinophils Absolute: 0.2 10*3/uL (ref 0.0–0.7)
Eosinophils Relative: 3 %
HEMATOCRIT: 39.9 % (ref 36.0–46.0)
Hemoglobin: 13.9 g/dL (ref 12.0–15.0)
LYMPHS ABS: 2 10*3/uL (ref 0.7–4.0)
Lymphocytes Relative: 23 %
MCH: 31 pg (ref 26.0–34.0)
MCHC: 34.8 g/dL (ref 30.0–36.0)
MCV: 88.9 fL (ref 78.0–100.0)
MONOS PCT: 6 %
Monocytes Absolute: 0.6 10*3/uL (ref 0.1–1.0)
NEUTROS ABS: 6.1 10*3/uL (ref 1.7–7.7)
NEUTROS PCT: 68 %
Platelets: 237 10*3/uL (ref 150–400)
RBC: 4.49 MIL/uL (ref 3.87–5.11)
RDW: 12.6 % (ref 11.5–15.5)
WBC: 8.9 10*3/uL (ref 4.0–10.5)

## 2017-05-14 LAB — RENAL FUNCTION PANEL
ALBUMIN: 3.9 g/dL (ref 3.5–5.0)
ANION GAP: 11 (ref 5–15)
BUN: 8 mg/dL (ref 6–20)
CALCIUM: 9 mg/dL (ref 8.9–10.3)
CO2: 25 mmol/L (ref 22–32)
Chloride: 103 mmol/L (ref 101–111)
Creatinine, Ser: 0.77 mg/dL (ref 0.44–1.00)
GFR calc non Af Amer: >60 mL/min (ref >60)
Glucose, Bld: 95 mg/dL (ref 65–99)
PHOSPHORUS: 2.5 mg/dL (ref 2.5–4.6)
POTASSIUM: 3.3 mmol/L — AB (ref 3.5–5.1)
Sodium: 139 mmol/L (ref 135–145)

## 2017-05-14 LAB — FOLATE: FOLATE: 17.5 ng/mL (ref >5.9)

## 2017-05-14 LAB — IRON AND TIBC
IRON: 62 ug/dL (ref 28–170)
Saturation Ratios: 20 % (ref 10.4–31.8)
TIBC: 304 ug/dL (ref 250–450)
UIBC: 242 ug/dL

## 2017-05-14 LAB — VITAMIN B12: Vitamin B-12: 790 pg/mL (ref 180–914)

## 2017-05-14 LAB — FERRITIN: FERRITIN: 54 ng/mL (ref 11–307)

## 2017-05-15 ENCOUNTER — Other Ambulatory Visit (HOSPITAL_COMMUNITY): Payer: Self-pay | Admitting: Oncology

## 2017-05-15 DIAGNOSIS — E559 Vitamin D deficiency, unspecified: Secondary | ICD-10-CM

## 2017-05-15 LAB — VITAMIN D 25 HYDROXY (VIT D DEFICIENCY, FRACTURES): VIT D 25 HYDROXY: 24.7 ng/mL — AB (ref 30.0–100.0)

## 2017-05-15 LAB — COPPER, SERUM: Copper: 95 ug/dL (ref 72–166)

## 2017-05-15 MED ORDER — ERGOCALCIFEROL 1.25 MG (50000 UT) PO CAPS: 50000.0000 [IU] | ORAL_CAPSULE | ORAL | 0 refills | Status: AC

## 2017-05-21 ENCOUNTER — Ambulatory Visit (HOSPITAL_COMMUNITY): Payer: Medicaid Other

## 2017-05-29 ENCOUNTER — Telehealth: Payer: Self-pay | Admitting: Family Medicine

## 2017-05-29 NOTE — Telephone Encounter (Signed)
Pt called, states she is taking the prozac at night with the xanax. Now that she is done with the xanax she felt " jacked up" last night and couldn't sleep. Does not want more xanax. Wants to know if she should start taking prozac in am? Or need a different med?

## 2017-05-29 NOTE — Telephone Encounter (Signed)
Patient  callling re the dosage of Prozac. Patient stayed up all night.   Please call and advise as she's not sure if she should continue these particular capsules.  cb  [phone]

## 2017-05-29 NOTE — Telephone Encounter (Signed)
Called kim, aware.

## 2017-05-29 NOTE — Telephone Encounter (Signed)
My discharge instructions specifically told her to take the prozac in the morning.  Try this.  Skip tonight and take in the am.

## 2017-06-03 ENCOUNTER — Encounter (HOSPITAL_COMMUNITY): Payer: Medicaid Other | Attending: Oncology

## 2017-06-03 ENCOUNTER — Encounter (HOSPITAL_COMMUNITY): Payer: Self-pay

## 2017-06-03 VITALS — BP 112/65 | HR 60 | Temp 98.6°F | Resp 16

## 2017-06-03 DIAGNOSIS — D509 Iron deficiency anemia, unspecified: Secondary | ICD-10-CM | POA: Diagnosis not present

## 2017-06-03 DIAGNOSIS — D508 Other iron deficiency anemias: Secondary | ICD-10-CM | POA: Insufficient documentation

## 2017-06-03 DIAGNOSIS — E538 Deficiency of other specified B group vitamins: Secondary | ICD-10-CM

## 2017-06-03 MED ORDER — SODIUM CHLORIDE 0.9 % IV SOLN
Freq: Once | INTRAVENOUS | Status: AC
  Administered 2017-06-03: 14:00:00 via INTRAVENOUS

## 2017-06-03 MED ORDER — SODIUM CHLORIDE 0.9 % IV SOLN
510.0000 mg | Freq: Once | INTRAVENOUS | Status: AC
  Administered 2017-06-03: 510 mg via INTRAVENOUS
  Filled 2017-06-03: qty 17

## 2017-06-03 MED ORDER — CYANOCOBALAMIN 1000 MCG/ML IJ SOLN: 1000.0000 ug | Freq: Once | INTRAMUSCULAR | Status: DC

## 2017-06-03 NOTE — Patient Instructions (Signed)
[facility] Discharge Instructions  RECOMMENDATIONS MADE BY THE CONSULTANT AND ANY TEST RESULTS WILL BE SENT TO YOUR REFERRING PHYSICIAN.  Feraheme given  Follow up as scheduled.  Thank you for choosing [facility] at [facility] to provide your oncology and hematology care.  To afford each patient quality time with our provider, please arrive at least 15 minutes before your scheduled appointment time.    If you have a lab appointment with the Cancer Center please come in thru the  Main Entrance and check in at the main information desk  You need to re-schedule your appointment should you arrive 10 or more minutes late.  We strive to give you quality time with our providers, and arriving late affects you and other patients whose appointments are after yours.  Also, if you no show three or more times for appointments you may be dismissed from the clinic at the providers discretion.     Again, thank you for choosing [facility].  Our hope is that these requests will decrease the amount of time that you wait before being seen by our physicians.       _____________________________________________________________  Should you have questions after your visit to [facility], please contact our office at [phone] between the hours of 8:30 a.m. and 4:30 p.m.  Voicemails left after 4:30 p.m. will not be returned until the following business day.  For prescription refill requests, have your pharmacy contact our office.       Resources For Cancer Patients and their Caregivers ? American Cancer Society: Can assist with transportation, wigs, general needs, runs Look Good Feel Better.        1-888-227-6333 ? Cancer Care: Provides financial assistance, online support groups, medication/co-pay assistance.  1-800-813-HOPE (4673) ? [facility] Assists [place] cancer patients and their  families through emotional , educational and financial support.  336-427-4357 ? [place] DSS Where to apply for food stamps, Medicaid and utility assistance. 336-342-1394 ? [facility]: Transportation to medical appointments. 336-347-2287 ? Social Security Administration: May apply for disability if have a Stage IV cancer. 336-342-7796 1-800-772-1213 ? [place] Aging, Disability and Transit Services: Assists with nutrition, care and transit needs. 336-349-2343  Cancer Center Support Programs: @10RELATIVEDAYS@ > Cancer Support Group  2nd Tuesday of the month 1pm-2pm, Journey Room  > Creative Journey  3rd Tuesday of the month 1130am-1pm, Journey Room  > Look Good Feel Better  1st Wednesday of the month 10am-12 noon, Journey Room (Call American Cancer Society to register 1-800-395-5775)   

## 2017-06-03 NOTE — Progress Notes (Signed)
Feraheme given today per orders. Patient tolerated it well without problems. Vitals stable and discharged home from clinic ambulatory.follow up as scheduled.

## 2017-06-04 ENCOUNTER — Encounter (HOSPITAL_BASED_OUTPATIENT_CLINIC_OR_DEPARTMENT_OTHER): Payer: Medicaid Other | Admitting: Oncology

## 2017-06-04 ENCOUNTER — Encounter (HOSPITAL_COMMUNITY): Payer: Self-pay

## 2017-06-04 VITALS — BP 108/64 | HR 65 | Temp 98.0°F | Resp 16 | Ht 65.0 in | Wt 192.5 lb

## 2017-06-04 DIAGNOSIS — D508 Other iron deficiency anemias: Secondary | ICD-10-CM | POA: Diagnosis present

## 2017-06-04 DIAGNOSIS — E538 Deficiency of other specified B group vitamins: Secondary | ICD-10-CM

## 2017-06-04 NOTE — Progress Notes (Signed)
Raylene Everts, MD [phone] [address] [place] [address]  Other iron deficiency anemia - Plan: CBC with Differential, Comprehensive metabolic panel, Iron and TIBC, Ferritin, Vitamin B12  Vitamin B 12 deficiency - Plan: CBC with Differential, Comprehensive metabolic panel, Iron and TIBC, Ferritin, Vitamin B12  CURRENT THERAPY: IV iron replacement when indicated and IM B12 therapy at home monthly.  INTERVAL HISTORY: [name] 41 y.o. 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  He presented for continued follow-up. She had lab work done on 05/14/17 which demonstrated that her ferritin was 54. She received a dose of Feraheme on 06/03/17. She states she has been feeling tired but feels a little better after receiving iron yesterday. She currently does not have any pica symptoms. She states that she has not been giving herself any IM B12 injections for the past few months. She states she still has her menstrual cycles under moderate. Otherwise she has no complaints.  Review of Systems  Constitutional: Positive for malaise/fatigue. Negative for chills, fever and weight loss.  HENT: Negative.   Eyes: Negative.   Respiratory: Negative.  Negative for cough.   Cardiovascular: Negative.  Negative for chest pain.  Gastrointestinal: Negative for blood in stool and melena.  Genitourinary: Negative.  Negative for hematuria.  Skin: Negative.   Neurological: Negative.  Negative for weakness.  Endo/Heme/Allergies: Negative.  Does not bruise/bleed easily.  Psychiatric/Behavioral: Negative.     Past Medical History:  Diagnosis Date  . Allergy    medication  . Anxiety    counselor  . Arthritis    history of  . Chronic anemia   . Chronic knee pain   . Chronic left shoulder pain     . Chronic neck pain   . Depression   . Frozen shoulder syndrome   . 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 Gastric bypass status for obesity 09/12/2015  . GERD (gastroesophageal reflux disease)   . Iron deficiency anemia 09/12/2015  . Microcytic hypochromic anemia 09/12/2015  . Migraines   . Obesity    gastric Bypass, Roux-en Y  . Patellofemoral syndrome, left   . Vitamin B 12 deficiency 09/13/2015    Past Surgical History:  Procedure Laterality Date  . FERTILITY SURGERY  2009   No abnormalities in female  . GASTRIC BYPASS  04/20/2011   [place], wt 271lb preop    Family History  Problem Relation Age of Onset  . Diabetes Father   . Hyperlipidemia Father   . Hypertension Father   . Cancer Father        skin cancer/ throat  . Arthritis Father   . Depression Mother        lifelong  . Asthma Mother   . Arthritis Mother   . COPD Mother   . Mental illness Mother   . Anesthesia problems Neg Hx     Social History   Social History  . Marital status: Legally Separated    Spouse name: N/A  . Number of children: 3  . Years of education: 15   Occupational History  . student     looking for work   Social History Main Topics  . Smoking status: Never Smoker  . Smokeless tobacco: Never Used  . Alcohol use No  . Drug use:  No  . Sexual activity: Yes    Partners: Male     Comment: single possible conception date July 22, 2011   Other Topics Concern  . None   Social History Narrative   Graduate [employer]   [employer] degree criminal justice   Lives at home with Birdena Crandall, age 25     PHYSICAL EXAMINATION  ECOG PERFORMANCE STATUS: 1 - Symptomatic but completely ambulatory  Vitals:   06/04/17 0940  BP: 108/64  Pulse: 65  Resp: 16  Temp: 98 F (36.7 C)    Physical Exam  Constitutional: She is oriented to person, place, and time. She appears well-developed and well-nourished. No distress.  HENT:  Head: Normocephalic and atraumatic.  Eyes: Conjunctivae are  normal. Pupils are equal, round, and reactive to light. No scleral icterus.  Neck: Normal range of motion. Neck supple.  Cardiovascular: Normal rate, regular rhythm and normal heart sounds.   No murmur heard. Pulmonary/Chest: Effort normal and breath sounds normal. No respiratory distress. She has no wheezes.  Abdominal: Soft. Bowel sounds are normal. She exhibits no distension and no mass. There is no tenderness. There is no rebound and no guarding.  Musculoskeletal: She exhibits no edema or tenderness.  Neurological: She is alert and oriented to person, place, and time. No cranial nerve deficit.  Skin: Skin is warm and dry. No rash noted. No erythema. No pallor.  Psychiatric: She has a normal mood and affect. Judgment and thought content normal.     LABORATORY DATA: CBC    Component Value Date/Time   WBC 8.9 05/14/2017 1243   RBC 4.49 05/14/2017 1243   HGB 13.9 05/14/2017 1243   HCT 39.9 05/14/2017 1243   PLT 237 05/14/2017 1243   MCV 88.9 05/14/2017 1243   MCH 31.0 05/14/2017 1243   MCHC 34.8 05/14/2017 1243   RDW 12.6 05/14/2017 1243   LYMPHSABS 2.0 05/14/2017 1243   MONOABS 0.6 05/14/2017 1243   EOSABS 0.2 05/14/2017 1243   BASOSABS 0.0 05/14/2017 1243      Chemistry      Component Value Date/Time   NA 139 05/14/2017 1243   K 3.3 (L) 05/14/2017 1243   CL 103 05/14/2017 1243   CO2 25 05/14/2017 1243   BUN 8 05/14/2017 1243   CREATININE 0.77 05/14/2017 1243   CREATININE 0.60 04/20/2014 1250      Component Value Date/Time   CALCIUM 9.0 05/14/2017 1243   ALKPHOS 68 10/29/2016 1447   AST 22 10/29/2016 1447   ALT 22 10/29/2016 1447   BILITOT 0.5 10/29/2016 1447     Lab Results  Component Value Date   IRON 62 05/14/2017   TIBC 304 05/14/2017   FERRITIN 54 05/14/2017   Lab Results  Component Value Date   ASTMHDQQ22 979 05/14/2017   Lab Results  Component Value Date   FOLATE 17.5 05/14/2017      RADIOGRAPHIC STUDIES:  No results  found.   PATHOLOGY:    ASSESSMENT AND PLAN:  1. Iron deficiency anemia and vitamin b12 deficiency due to malabsorption from h/o gastric bypass.  PLAN: Reviewed labs in detail with the patient. She is currently not anemic. Goal is to keep ferritin above 100.  B12 levels stable.  RTC in 3 months with labs  ORDERS PLACED FOR THIS ENCOUNTER: Orders Placed This Encounter  Procedures  . CBC with Differential  . Comprehensive metabolic panel  . Iron and TIBC  . Ferritin  . Vitamin B12     THERAPY PLAN:  Ongoing monitoring of iron studies and other vitamins with IV iron replacement therapy when indicated.  All questions were answered. The patient knows to call the clinic with any problems, questions or concerns. We can certainly see the patient much sooner if necessary.   This note is electronically signed by: Twana First, MD 06/04/2017 9:45 AM

## 2017-06-04 NOTE — Patient Instructions (Signed)
[place] at [facility] Discharge Instructions  RECOMMENDATIONS MADE BY THE CONSULTANT AND ANY TEST RESULTS WILL BE SENT TO YOUR REFERRING PHYSICIAN.  You were seen today by Dr. Talbert Cage. Return in 3 months for labs and follow up.  Thank you for choosing [place] at [facility] to provide your oncology and hematology care.  To afford each patient quality time with our provider, please arrive at least 15 minutes before your scheduled appointment time.    If you have a lab appointment with the [facility] please come in thru the  Main Entrance and check in at the main information desk  You need to re-schedule your appointment should you arrive 10 or more minutes late.  We strive to give you quality time with our providers, and arriving late affects you and other patients whose appointments are after yours.  Also, if you no show three or more times for appointments you may be dismissed from the clinic at the providers discretion.     Again, thank you for choosing [facility].  Our hope is that these requests will decrease the amount of time that you wait before being seen by our physicians.       _____________________________________________________________  Should you have questions after your visit to [facility], please contact our office at [phone] between the hours of 8:30 a.m. and 4:30 p.m.  Voicemails left after 4:30 p.m. will not be returned until the following business day.  For prescription refill requests, have your pharmacy contact our office.       Resources For Cancer Patients and their Caregivers ? American Cancer Society: Can assist with transportation, wigs, general needs, runs Look Good Feel Better.        802-686-9445 ? Cancer Care: Provides financial assistance, online support groups, medication/co-pay assistance.  1-800-813-HOPE 8700705207) ? [place] Assists  [place] cancer patients and their families through emotional , educational and financial support.  (404) 426-8428 ? [place] DSS Where to apply for food stamps, Medicaid and utility assistance. (959) 225-8200 ? [employer]: Transportation to medical appointments. (714) 302-9311 ? Social Security Administration: May apply for disability if have a Stage IV cancer. 707-364-8272 807-434-6450 ? [employer], Disability and Transit Services: Assists with nutrition, care and transit needs. Post Support Programs: @10RELATIVEDAYS @ > Cancer Support Group  2nd Tuesday of the month 1pm-2pm, Journey Room  > Creative Journey  3rd Tuesday of the month 1130am-1pm, Journey Room  > Look Good Feel Better  1st Wednesday of the month 10am-12 noon, Journey Room (Call [place] to register [phone])

## 2017-06-06 ENCOUNTER — Ambulatory Visit (INDEPENDENT_AMBULATORY_CARE_PROVIDER_SITE_OTHER): Payer: Medicaid Other | Admitting: Family Medicine

## 2017-06-06 ENCOUNTER — Encounter: Payer: Self-pay | Admitting: Family Medicine

## 2017-06-06 VITALS — BP 120/68 | HR 76 | Temp 98.1°F | Resp 16 | Ht 66.0 in | Wt 188.0 lb

## 2017-06-06 DIAGNOSIS — F4310 Post-traumatic stress disorder, unspecified: Secondary | ICD-10-CM | POA: Diagnosis not present

## 2017-06-06 MED ORDER — ACYCLOVIR 400 MG PO TABS: 400.0000 mg | ORAL_TABLET | Freq: Two times a day (BID) | ORAL | 5 refills | Status: AC

## 2017-06-06 NOTE — Patient Instructions (Signed)
Continue the fluoxetine Take in the morning with food Walk every day that you are able  See me in 6 months Call sooner for problems

## 2017-06-06 NOTE — Progress Notes (Signed)
Chief Complaint  Patient presents with  . Follow-up    1 month   Doing well on fluoxetine Has lost weight Mood is stable Has increased activity and exercise No side effects if takes in the am.  If taken at bed it caused her to stay up    Patient Active Problem List   Diagnosis Date Noted  . Post traumatic stress disorder (PTSD) 04/26/2017  . Submucous leiomyoma of uterus 04/03/2016  . Vitamin B 12 deficiency 09/13/2015  . Iron deficiency anemia 09/12/2015  . Gastric bypass status for obesity 09/12/2015  . Left anterior knee pain 06/03/2014  . Obesity     Outpatient Encounter Prescriptions as of 06/06/2017  Medication Sig  . ergocalciferol (VITAMIN D2) 50000 units capsule Take 1 capsule (50,000 Units total) by mouth once a week.  Marland Kitchen FLUoxetine (PROZAC) 20 MG tablet Take 1 tablet (20 mg total) by mouth daily.  Marland Kitchen acyclovir (ZOVIRAX) 400 MG tablet Take 1 tablet (400 mg total) by mouth 2 (two) times daily.  . [DISCONTINUED] acyclovir (ZOVIRAX) 400 MG tablet    Facility-Administered Encounter Medications as of 06/06/2017  Medication  . 0.9 %  sodium chloride infusion  . ferumoxytol (FERAHEME) 510 mg in sodium chloride 0.9 % 100 mL IVPB    Allergies  Allergen Reactions  . Penicillins Itching and Swelling    Has patient had a PCN reaction causing immediate rash, facial/tongue/throat swelling, SOB or lightheadedness with hypotension: Yes Has patient had a PCN reaction causing severe rash involving mucus membranes or skin necrosis: No Has patient had a PCN reaction that required hospitalization No Has patient had a PCN reaction occurring within the last 10 years: No If all of the above answers are "NO", then may proceed with Cephalosporin use.    Marland Kitchen Morphine And Related Other (See Comments)    'intensified pain"  . Nsaids Other (See Comments)    Due to gastric bypass  . Tramadol Other (See Comments)    Makes her restless    Review of Systems  Constitutional: Positive for  unexpected weight change. Negative for activity change and appetite change.  HENT: Positive for trouble swallowing. Negative for congestion and dental problem.        Due to stomach surgery - must be careful  Respiratory: Negative for cough and shortness of breath.   Cardiovascular: Negative for chest pain and palpitations.  Gastrointestinal: Negative for constipation and diarrhea.  Musculoskeletal: Negative for arthralgias and back pain.  Psychiatric/Behavioral: Positive for dysphoric mood. The patient is nervous/anxious.        Better    BP 120/68 (BP Location: Right Arm, Patient Position: Sitting, Cuff Size: Normal)   Pulse 76   Temp 98.1 F (36.7 C) (Temporal)   Resp 16   Ht 5\' 6"  (1.676 m)   Wt 188 lb (85.3 kg)   LMP 05/06/2017 (Approximate)   SpO2 96%   BMI 30.34 kg/m   Physical Exam  Constitutional: She is oriented to person, place, and time. She appears well-developed and well-nourished. No distress.  HENT:  Head: Normocephalic and atraumatic.  Mouth/Throat: Oropharynx is clear and moist.  Musculoskeletal: Normal range of motion. She exhibits no edema.  Neurological: She is alert and oriented to person, place, and time.  Psychiatric: She has a normal mood and affect. Her behavior is normal.  Somewhat bland affect    ASSESSMENT/PLAN:  1. Post traumatic stress disorder (PTSD) improved   Patient Instructions  Continue the fluoxetine Take in the morning  with food Walk every day that you are able  See me in 6 months Call sooner for problems    Raylene Everts, MD

## 2017-06-07 ENCOUNTER — Telehealth: Payer: Self-pay | Admitting: Family Medicine

## 2017-06-07 NOTE — Telephone Encounter (Signed)
Patient is aware this rx was denied.

## 2017-06-07 NOTE — Telephone Encounter (Signed)
no

## 2017-06-07 NOTE — Telephone Encounter (Signed)
Patient is requesting a refill of xanex-  [facility]. Cb# [phone]

## 2017-06-07 NOTE — Telephone Encounter (Signed)
Do you want to write this for her?

## 2017-06-14 ENCOUNTER — Telehealth: Payer: Self-pay | Admitting: Family Medicine

## 2017-06-14 NOTE — Telephone Encounter (Signed)
Patient is requesting a meal suppressant because her Prozac is making her eat a lot  Cb#: [phone]

## 2017-06-17 NOTE — Telephone Encounter (Signed)
No.  She needs to try harder to control her eating.

## 2017-06-18 NOTE — Telephone Encounter (Signed)
LMTCB

## 2017-06-19 NOTE — Telephone Encounter (Signed)
Patient called left message stating she is returning a call, left phone number [phone]

## 2017-06-19 NOTE — Telephone Encounter (Signed)
Advise add wellbutrin 150 a day for 7 days, then wellbutrin 150 BID.   Stop the fluoxetine when the wellbutrin has been taken for 2 weeks

## 2017-06-19 NOTE — Telephone Encounter (Signed)
Called and talked to [name], went over directions of switching medications, states understanding.

## 2017-06-19 NOTE — Telephone Encounter (Signed)
Spoke to [place], would like a change in meds.Marland KitchenMarland Kitchen

## 2017-06-19 NOTE — Telephone Encounter (Signed)
Called and left msg to call back.

## 2017-09-04 ENCOUNTER — Other Ambulatory Visit (HOSPITAL_COMMUNITY): Payer: Medicaid Other

## 2017-09-04 ENCOUNTER — Ambulatory Visit (HOSPITAL_COMMUNITY): Payer: Medicaid Other

## 2017-12-05 ENCOUNTER — Ambulatory Visit: Payer: Medicaid Other | Admitting: Family Medicine

## 2018-03-10 ENCOUNTER — Encounter: Payer: Self-pay | Admitting: Family Medicine

## 2023-12-30 ENCOUNTER — Encounter (HOSPITAL_COMMUNITY): Payer: Self-pay | Admitting: Oncology
